# Patient Record
Sex: Male | Born: 1955
Health system: Southern US, Community
[De-identification: ages and names within clinical notes are randomized; demographics above are authoritative.]

## PROBLEM LIST (undated history)

## (undated) DIAGNOSIS — I10 Essential (primary) hypertension: Secondary | ICD-10-CM

## (undated) HISTORY — DX: Essential (primary) hypertension: I10

---
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
## 1991-05-24 HISTORY — PX: OTHER SURGICAL HISTORY: SHX169

## 1996-05-23 HISTORY — PX: OTHER SURGICAL HISTORY: SHX169

## 2006-12-14 ENCOUNTER — Encounter: Admission: RE | Admit: 2006-12-14 | Discharge: 2006-12-14 | Payer: Self-pay | Admitting: Orthopedic Surgery

## 2006-12-22 HISTORY — PX: OTHER SURGICAL HISTORY: SHX169

## 2007-03-13 ENCOUNTER — Ambulatory Visit: Payer: Self-pay | Admitting: Family Medicine

## 2007-03-13 DIAGNOSIS — I10 Essential (primary) hypertension: Secondary | ICD-10-CM | POA: Insufficient documentation

## 2007-03-13 DIAGNOSIS — H409 Unspecified glaucoma: Secondary | ICD-10-CM | POA: Insufficient documentation

## 2007-03-16 LAB — CONVERTED CEMR LAB
AST: 27 units/L (ref 0–37)
Albumin: 4.5 g/dL (ref 3.5–5.2)
Basophils Absolute: 0 10*3/uL (ref 0.0–0.1)
Calcium: 9.9 mg/dL (ref 8.4–10.5)
Chloride: 104 meq/L (ref 96–112)
Cholesterol: 196 mg/dL (ref 0–200)
Eosinophils Absolute: 0.1 10*3/uL (ref 0.0–0.6)
GFR calc non Af Amer: 75 mL/min
MCHC: 34.5 g/dL (ref 30.0–36.0)
MCV: 86.3 fL (ref 78.0–100.0)
PSA: 1.15 ng/mL (ref 0.10–4.00)
Platelets: 214 10*3/uL (ref 150–400)
RBC: 4.93 M/uL (ref 4.22–5.81)
Sodium: 141 meq/L (ref 135–145)
Total CHOL/HDL Ratio: 6.2
Triglycerides: 142 mg/dL (ref 0–149)

## 2007-04-20 ENCOUNTER — Ambulatory Visit: Payer: Self-pay | Admitting: Family Medicine

## 2008-06-16 ENCOUNTER — Telehealth: Payer: Self-pay | Admitting: Family Medicine

## 2008-07-14 ENCOUNTER — Ambulatory Visit: Payer: Self-pay | Admitting: Family Medicine

## 2008-07-14 LAB — CONVERTED CEMR LAB
Bilirubin Urine: NEGATIVE
Glucose, Urine, Semiquant: NEGATIVE
Ketones, urine, test strip: NEGATIVE
Specific Gravity, Urine: 1.015
Urobilinogen, UA: 0.2
pH: 7

## 2008-07-21 LAB — CONVERTED CEMR LAB
Basophils Absolute: 0 10*3/uL (ref 0.0–0.1)
Bilirubin, Direct: 0.1 mg/dL (ref 0.0–0.3)
Calcium: 9.5 mg/dL (ref 8.4–10.5)
Cholesterol: 198 mg/dL (ref 0–200)
Creatinine, Ser: 1.2 mg/dL (ref 0.4–1.5)
Eosinophils Absolute: 0.1 10*3/uL (ref 0.0–0.7)
GFR calc Af Amer: 82 mL/min
HCT: 43 % (ref 39.0–52.0)
Hemoglobin: 14.8 g/dL (ref 13.0–17.0)
LDL Cholesterol: 127 mg/dL — ABNORMAL HIGH (ref 0–99)
MCHC: 34.4 g/dL (ref 30.0–36.0)
MCV: 86.4 fL (ref 78.0–100.0)
Monocytes Absolute: 0.4 10*3/uL (ref 0.1–1.0)
Neutro Abs: 4.5 10*3/uL (ref 1.4–7.7)
PSA: 1.25 ng/mL (ref 0.10–4.00)
RDW: 12.4 % (ref 11.5–14.6)
Sodium: 139 meq/L (ref 135–145)
TSH: 1.16 microintl units/mL (ref 0.35–5.50)
Total Bilirubin: 1 mg/dL (ref 0.3–1.2)
Triglycerides: 171 mg/dL — ABNORMAL HIGH (ref 0–149)

## 2008-07-22 ENCOUNTER — Ambulatory Visit: Payer: Self-pay | Admitting: Family Medicine

## 2010-10-11 ENCOUNTER — Encounter: Payer: Self-pay | Admitting: Family Medicine

## 2010-10-11 ENCOUNTER — Ambulatory Visit (INDEPENDENT_AMBULATORY_CARE_PROVIDER_SITE_OTHER): Payer: 59 | Admitting: Family Medicine

## 2010-10-11 VITALS — BP 122/78 | HR 80 | Temp 98.6°F | Wt 244.0 lb

## 2010-10-11 DIAGNOSIS — I10 Essential (primary) hypertension: Secondary | ICD-10-CM

## 2010-10-11 MED ORDER — LOSARTAN POTASSIUM-HCTZ 50-12.5 MG PO TABS: 1.0000 | ORAL_TABLET | Freq: Every day | ORAL | Status: DC

## 2010-10-11 NOTE — Progress Notes (Signed)
  Subjective:    Patient ID: Andrew Vaughn, male    DOB: 04-08-1956, 55 y.o.   MRN: [id number]  HPI Here to follow up on HTN after an absence of 2 years. He feels fine and say his BP has been stable. He is trying to watch his weight with mixed results.he has set up a cpx with labs for July.   Review of Systems  Constitutional: Negative.   Respiratory: Negative.   Cardiovascular: Negative.        Objective:   Physical Exam  Constitutional: He appears well-developed and well-nourished.  Neck: No thyromegaly present.  Cardiovascular: Normal rate, normal heart sounds and intact distal pulses.   Pulmonary/Chest: Effort normal and breath sounds normal.  Lymphadenopathy:    He has no cervical adenopathy.          Assessment & Plan:  Switch to Losartan HCT to save money. Follow up at the cpx

## 2010-12-01 ENCOUNTER — Other Ambulatory Visit (INDEPENDENT_AMBULATORY_CARE_PROVIDER_SITE_OTHER): Payer: 59

## 2010-12-01 DIAGNOSIS — Z0000 Encounter for general adult medical examination without abnormal findings: Secondary | ICD-10-CM

## 2010-12-01 LAB — BASIC METABOLIC PANEL
CO2: 29 mEq/L (ref 19–32)
Calcium: 9.4 mg/dL (ref 8.4–10.5)
Chloride: 105 mEq/L (ref 96–112)
Sodium: 140 mEq/L (ref 135–145)

## 2010-12-01 LAB — CBC WITH DIFFERENTIAL/PLATELET
Basophils Relative: 0.4 % (ref 0.0–3.0)
Eosinophils Absolute: 0.2 10*3/uL (ref 0.0–0.7)
Eosinophils Relative: 2.1 % (ref 0.0–5.0)
Hemoglobin: 14.7 g/dL (ref 13.0–17.0)
Lymphocytes Relative: 23.8 % (ref 12.0–46.0)
MCHC: 34.2 g/dL (ref 30.0–36.0)
Neutro Abs: 4.9 10*3/uL (ref 1.4–7.7)
Neutrophils Relative %: 67.5 % (ref 43.0–77.0)
RBC: 4.93 Mil/uL (ref 4.22–5.81)
WBC: 7.2 10*3/uL (ref 4.5–10.5)

## 2010-12-01 LAB — LIPID PANEL
HDL: 36.9 mg/dL — ABNORMAL LOW (ref >39.00)
Total CHOL/HDL Ratio: 5

## 2010-12-01 LAB — POCT URINALYSIS DIPSTICK
Bilirubin, UA: NEGATIVE
Glucose, UA: NEGATIVE
Ketones, UA: NEGATIVE
Leukocytes, UA: NEGATIVE
Nitrite, UA: NEGATIVE
pH, UA: 5.5

## 2010-12-01 LAB — HEPATIC FUNCTION PANEL
ALT: 25 U/L (ref 0–53)
AST: 30 U/L (ref 0–37)
Albumin: 4.8 g/dL (ref 3.5–5.2)
Alkaline Phosphatase: 85 U/L (ref 39–117)
Bilirubin, Direct: 0.3 mg/dL (ref 0.0–0.3)
Total Protein: 7.2 g/dL (ref 6.0–8.3)

## 2010-12-01 LAB — PSA: PSA: 1.31 ng/mL (ref 0.10–4.00)

## 2010-12-01 LAB — TSH: TSH: 1.16 u[IU]/mL (ref 0.35–5.50)

## 2010-12-08 ENCOUNTER — Encounter: Payer: Self-pay | Admitting: Family Medicine

## 2010-12-08 ENCOUNTER — Ambulatory Visit (INDEPENDENT_AMBULATORY_CARE_PROVIDER_SITE_OTHER): Payer: 59 | Admitting: Family Medicine

## 2010-12-08 VITALS — BP 120/80 | HR 70 | Temp 98.2°F | Ht 70.0 in | Wt 242.0 lb

## 2010-12-08 DIAGNOSIS — Z0000 Encounter for general adult medical examination without abnormal findings: Secondary | ICD-10-CM

## 2010-12-08 NOTE — Progress Notes (Signed)
  Subjective:    Patient ID: Andrew Vaughn, male    DOB: [birth year], 55 y.o.   MRN: [id number]  HPI 55 yr old male for a cpx. He feels fine and has no concerns.    Review of Systems  Constitutional: Negative.   HENT: Negative.   Eyes: Negative.   Respiratory: Negative.   Cardiovascular: Negative.   Gastrointestinal: Negative.   Genitourinary: Negative.   Musculoskeletal: Negative.   Skin: Negative.   Neurological: Negative.   Hematological: Negative.   Psychiatric/Behavioral: Negative.        Objective:   Physical Exam  Constitutional: He is oriented to person, place, and time. He appears well-developed and well-nourished. No distress.  HENT:  Head: Normocephalic and atraumatic.  Right Ear: External ear normal.  Left Ear: External ear normal.  Nose: Nose normal.  Mouth/Throat: Oropharynx is clear and moist. No oropharyngeal exudate.  Eyes: Conjunctivae and EOM are normal. Pupils are equal, round, and reactive to light. Right eye exhibits no discharge. Left eye exhibits no discharge. No scleral icterus.  Neck: Neck supple. No JVD present. No tracheal deviation present. No thyromegaly present.  Cardiovascular: Normal rate, regular rhythm, normal heart sounds and intact distal pulses.  Exam reveals no gallop and no friction rub.   No murmur heard.      EKG normal  Pulmonary/Chest: Effort normal and breath sounds normal. No respiratory distress. He has no wheezes. He has no rales. He exhibits no tenderness.  Abdominal: Soft. Bowel sounds are normal. He exhibits no distension and no mass. There is no tenderness. There is no rebound and no guarding.  Genitourinary: Rectum normal, prostate normal and penis normal. Guaiac negative stool. No penile tenderness.  Musculoskeletal: Normal range of motion. He exhibits no edema and no tenderness.  Lymphadenopathy:    He has no cervical adenopathy.  Neurological: He is alert and oriented to person, place, and time. He has normal  reflexes. No cranial nerve deficit. He exhibits normal muscle tone. Coordination normal.  Skin: Skin is warm and dry. No rash noted. He is not diaphoretic. No erythema. No pallor.  Psychiatric: He has a normal mood and affect. His behavior is normal. Judgment and thought content normal.          Assessment & Plan:  He already watches his diet to keep the cholesterol down. I suggested he add fish oil supplements daily. Set up a colonoscopy.

## 2011-01-04 ENCOUNTER — Encounter: Payer: Self-pay | Admitting: Gastroenterology

## 2011-02-21 ENCOUNTER — Ambulatory Visit (AMBULATORY_SURGERY_CENTER): Payer: 59 | Admitting: *Deleted

## 2011-02-21 ENCOUNTER — Encounter: Payer: Self-pay | Admitting: Gastroenterology

## 2011-02-21 VITALS — Ht 71.0 in | Wt 242.0 lb

## 2011-02-21 DIAGNOSIS — Z1211 Encounter for screening for malignant neoplasm of colon: Secondary | ICD-10-CM

## 2011-02-21 MED ORDER — PEG-KCL-NACL-NASULF-NA ASC-C 100 G PO SOLR: ORAL | Status: DC

## 2011-03-07 ENCOUNTER — Other Ambulatory Visit: Payer: 59 | Admitting: Gastroenterology

## 2011-03-30 NOTE — Progress Notes (Signed)
Addended by: Maple Hudson on: 03/30/2011 12:39 PM   Modules accepted: Level of Service

## 2011-11-18 ENCOUNTER — Other Ambulatory Visit: Payer: Self-pay | Admitting: Family Medicine

## 2011-11-18 NOTE — Telephone Encounter (Signed)
Call in #30 with 2 rf. He needs and OV soon

## 2011-11-18 NOTE — Telephone Encounter (Signed)
Can we refill this? 

## 2011-11-30 ENCOUNTER — Telehealth: Payer: Self-pay | Admitting: Family Medicine

## 2011-11-30 NOTE — Telephone Encounter (Signed)
I phoned in again today and left a voice message for pt.

## 2011-11-30 NOTE — Telephone Encounter (Signed)
Patient called stating that as of 11/24/11 his rx for losartan was still not at his pharmacy. [facility] [address] Please assist.

## 2013-01-19 ENCOUNTER — Other Ambulatory Visit: Payer: Self-pay | Admitting: Family Medicine

## 2013-01-23 ENCOUNTER — Other Ambulatory Visit: Payer: Self-pay | Admitting: Family Medicine

## 2013-01-24 NOTE — Telephone Encounter (Signed)
Looks like we have not seen this person in a long time?

## 2013-02-11 NOTE — Telephone Encounter (Signed)
Pt scheduled med check, would like to receive refill to last until then. Please assist.

## 2013-02-22 ENCOUNTER — Encounter: Payer: Self-pay | Admitting: Family Medicine

## 2013-02-22 ENCOUNTER — Ambulatory Visit (INDEPENDENT_AMBULATORY_CARE_PROVIDER_SITE_OTHER): Payer: BC Managed Care – PPO | Admitting: Family Medicine

## 2013-02-22 VITALS — BP 152/90 | HR 75 | Temp 98.4°F | Wt 245.0 lb

## 2013-02-22 DIAGNOSIS — I10 Essential (primary) hypertension: Secondary | ICD-10-CM

## 2013-02-22 MED ORDER — LOSARTAN POTASSIUM-HCTZ 50-12.5 MG PO TABS: ORAL_TABLET | ORAL | Status: DC

## 2013-02-22 NOTE — Progress Notes (Signed)
  Subjective:    Patient ID: Andrew Vaughn, male    DOB: 1955-06-22, 57 y.o.   MRN: [id number]  HPI Here to follow up. He feels fine but his meds ran out several months ago. He has not checked his BP.    Review of Systems  Constitutional: Negative.   Respiratory: Negative.   Cardiovascular: Negative.        Objective:   Physical Exam  Constitutional: He appears well-developed and well-nourished.  Cardiovascular: Normal rate, regular rhythm, normal heart sounds and intact distal pulses.   Pulmonary/Chest: Effort normal and breath sounds normal.          Assessment & Plan:  Get back on previous meds. He will set up a cpx soon.

## 2014-07-12 ENCOUNTER — Other Ambulatory Visit: Payer: Self-pay | Admitting: Family Medicine

## 2014-07-16 ENCOUNTER — Ambulatory Visit (INDEPENDENT_AMBULATORY_CARE_PROVIDER_SITE_OTHER): Payer: BLUE CROSS/BLUE SHIELD | Admitting: Family Medicine

## 2014-07-16 ENCOUNTER — Encounter: Payer: Self-pay | Admitting: Family Medicine

## 2014-07-16 VITALS — BP 152/77 | HR 63 | Temp 98.3°F | Ht 71.0 in | Wt 240.0 lb

## 2014-07-16 DIAGNOSIS — N529 Male erectile dysfunction, unspecified: Secondary | ICD-10-CM | POA: Insufficient documentation

## 2014-07-16 DIAGNOSIS — I10 Essential (primary) hypertension: Secondary | ICD-10-CM

## 2014-07-16 DIAGNOSIS — N528 Other male erectile dysfunction: Secondary | ICD-10-CM

## 2014-07-16 MED ORDER — LOSARTAN POTASSIUM-HCTZ 50-12.5 MG PO TABS: 1.0000 | ORAL_TABLET | Freq: Every day | ORAL | Status: DC

## 2014-07-16 MED ORDER — SILDENAFIL CITRATE 100 MG PO TABS: 100.0000 mg | ORAL_TABLET | ORAL | Status: DC | PRN

## 2014-07-16 NOTE — Progress Notes (Signed)
   Subjective:    Patient ID: Andrew Vaughn, male    DOB: 01/04/1956, 59 y.o.   MRN: [id number]  HPI Here to follow up on HTN. He has been rationing his BP meds for the past month so his pressure is up higher than usual. He has felt fine but he asks about trouble with erections. He has been spending a lot of time out of town the past few years but now plans to be here on a regular basis.    Review of Systems  Constitutional: Negative.   Respiratory: Negative.   Cardiovascular: Negative.   Neurological: Negative.        Objective:   Physical Exam  Constitutional: He appears well-developed and well-nourished.  Cardiovascular: Normal rate, regular rhythm, normal heart sounds and intact distal pulses.   Pulmonary/Chest: Effort normal and breath sounds normal.          Assessment & Plan:  Refilled his Hyzaar. He will schedule a cpx with labs sometime soon. Try Viagra 100 mg.

## 2014-07-16 NOTE — Progress Notes (Signed)
Pre visit review using our clinic review tool, if applicable. No additional management support is needed unless otherwise documented below in the visit note. 

## 2014-07-17 ENCOUNTER — Telehealth: Payer: Self-pay | Admitting: Family Medicine

## 2014-07-17 NOTE — Telephone Encounter (Signed)
emmi emailed °

## 2014-09-09 ENCOUNTER — Other Ambulatory Visit: Payer: BLUE CROSS/BLUE SHIELD

## 2014-09-16 ENCOUNTER — Encounter: Payer: BLUE CROSS/BLUE SHIELD | Admitting: Family Medicine

## 2014-12-02 ENCOUNTER — Other Ambulatory Visit (INDEPENDENT_AMBULATORY_CARE_PROVIDER_SITE_OTHER): Payer: BLUE CROSS/BLUE SHIELD

## 2014-12-02 DIAGNOSIS — Z0000 Encounter for general adult medical examination without abnormal findings: Secondary | ICD-10-CM | POA: Diagnosis not present

## 2014-12-02 LAB — CBC WITH DIFFERENTIAL/PLATELET
BASOS ABS: 0 10*3/uL (ref 0.0–0.1)
BASOS PCT: 0.4 % (ref 0.0–3.0)
EOS ABS: 0.1 10*3/uL (ref 0.0–0.7)
EOS PCT: 1.7 % (ref 0.0–5.0)
HEMATOCRIT: 42 % (ref 39.0–52.0)
HEMOGLOBIN: 13.9 g/dL (ref 13.0–17.0)
Lymphocytes Relative: 19.1 % (ref 12.0–46.0)
Lymphs Abs: 1.4 10*3/uL (ref 0.7–4.0)
MCHC: 33.2 g/dL (ref 30.0–36.0)
MCV: 86.7 fl (ref 78.0–100.0)
MONOS PCT: 5.8 % (ref 3.0–12.0)
Monocytes Absolute: 0.4 10*3/uL (ref 0.1–1.0)
NEUTROS ABS: 5.2 10*3/uL (ref 1.4–7.7)
Neutrophils Relative %: 73 % (ref 43.0–77.0)
Platelets: 190 10*3/uL (ref 150.0–400.0)
RBC: 4.85 Mil/uL (ref 4.22–5.81)
RDW: 13.3 % (ref 11.5–15.5)
WBC: 7.1 10*3/uL (ref 4.0–10.5)

## 2014-12-02 LAB — POCT URINALYSIS DIPSTICK
Blood, UA: NEGATIVE
Glucose, UA: NEGATIVE
Ketones, UA: NEGATIVE
Leukocytes, UA: NEGATIVE
Nitrite, UA: NEGATIVE
PH UA: 6
SPEC GRAV UA: 1.025
Urobilinogen, UA: 1

## 2014-12-02 LAB — PSA: PSA: 3.4 ng/mL (ref 0.10–4.00)

## 2014-12-02 LAB — HEPATIC FUNCTION PANEL
ALT: 20 U/L (ref 0–53)
AST: 24 U/L (ref 0–37)
Albumin: 4.4 g/dL (ref 3.5–5.2)
Alkaline Phosphatase: 86 U/L (ref 39–117)
BILIRUBIN TOTAL: 0.9 mg/dL (ref 0.2–1.2)
Bilirubin, Direct: 0.2 mg/dL (ref 0.0–0.3)
TOTAL PROTEIN: 7 g/dL (ref 6.0–8.3)

## 2014-12-02 LAB — BASIC METABOLIC PANEL
BUN: 19 mg/dL (ref 6–23)
CHLORIDE: 103 meq/L (ref 96–112)
CO2: 31 meq/L (ref 19–32)
CREATININE: 1.06 mg/dL (ref 0.40–1.50)
Calcium: 9.6 mg/dL (ref 8.4–10.5)
GFR: 76.33 mL/min (ref >60.00)
Glucose, Bld: 94 mg/dL (ref 70–99)
POTASSIUM: 4.6 meq/L (ref 3.5–5.1)
SODIUM: 141 meq/L (ref 135–145)

## 2014-12-02 LAB — LIPID PANEL
CHOL/HDL RATIO: 5
CHOLESTEROL: 158 mg/dL (ref 0–200)
HDL: 30.5 mg/dL — ABNORMAL LOW (ref >39.00)
LDL Cholesterol: 109 mg/dL — ABNORMAL HIGH (ref 0–99)
NONHDL: 127.5
Triglycerides: 91 mg/dL (ref 0.0–149.0)
VLDL: 18.2 mg/dL (ref 0.0–40.0)

## 2014-12-02 LAB — TSH: TSH: 1.48 u[IU]/mL (ref 0.35–4.50)

## 2014-12-09 ENCOUNTER — Encounter: Payer: Self-pay | Admitting: Family Medicine

## 2014-12-09 ENCOUNTER — Ambulatory Visit (INDEPENDENT_AMBULATORY_CARE_PROVIDER_SITE_OTHER): Payer: BLUE CROSS/BLUE SHIELD | Admitting: Family Medicine

## 2014-12-09 VITALS — BP 127/76 | HR 66 | Temp 98.7°F | Ht 71.0 in | Wt 238.0 lb

## 2014-12-09 DIAGNOSIS — N138 Other obstructive and reflux uropathy: Secondary | ICD-10-CM

## 2014-12-09 DIAGNOSIS — N401 Enlarged prostate with lower urinary tract symptoms: Secondary | ICD-10-CM

## 2014-12-09 DIAGNOSIS — Z0000 Encounter for general adult medical examination without abnormal findings: Secondary | ICD-10-CM

## 2014-12-09 NOTE — Progress Notes (Signed)
   Subjective:    Patient ID: Andrew Vaughn, male    DOB: 1956/03/31, 59 y.o.   MRN: [id number]  HPI 59 yr old male for a cpx. He feels well. He sees his ophthalmologist every 6 months.    Review of Systems  Constitutional: Negative.   HENT: Negative.   Eyes: Negative.   Respiratory: Negative.   Cardiovascular: Negative.   Gastrointestinal: Negative.   Genitourinary: Negative.   Musculoskeletal: Negative.   Skin: Negative.   Neurological: Negative.   Psychiatric/Behavioral: Negative.        Objective:   Physical Exam  Constitutional: He is oriented to person, place, and time. He appears well-developed and well-nourished. No distress.  HENT:  Head: Normocephalic and atraumatic.  Right Ear: External ear normal.  Left Ear: External ear normal.  Nose: Nose normal.  Mouth/Throat: Oropharynx is clear and moist. No oropharyngeal exudate.  Eyes: Conjunctivae and EOM are normal. Pupils are equal, round, and reactive to light. Right eye exhibits no discharge. Left eye exhibits no discharge. No scleral icterus.  Neck: Neck supple. No JVD present. No tracheal deviation present. No thyromegaly present.  Cardiovascular: Normal rate, regular rhythm, normal heart sounds and intact distal pulses.  Exam reveals no gallop and no friction rub.   No murmur heard. EKG normal   Pulmonary/Chest: Effort normal and breath sounds normal. No respiratory distress. He has no wheezes. He has no rales. He exhibits no tenderness.  Abdominal: Soft. Bowel sounds are normal. He exhibits no distension and no mass. There is no tenderness. There is no rebound and no guarding.  Genitourinary: Rectum normal, prostate normal and penis normal. Guaiac negative stool. No penile tenderness.  Musculoskeletal: Normal range of motion. He exhibits no edema or tenderness.  Lymphadenopathy:    He has no cervical adenopathy.  Neurological: He is alert and oriented to person, place, and time. He has normal reflexes. No  cranial nerve deficit. He exhibits normal muscle tone. Coordination normal.  Skin: Skin is warm and dry. No rash noted. He is not diaphoretic. No erythema. No pallor.  Psychiatric: He has a normal mood and affect. His behavior is normal. Judgment and thought content normal.          Assessment & Plan:  Well exam. We discussed diet and exercise advice. He needs to lose some more weight. Set up a colonoscopy soon. His PSA, although in the normal range, has jumped up by over 2 full points from last time, so we will follow this closely. He will come in for another PSA check in 6 months .

## 2014-12-09 NOTE — Addendum Note (Signed)
Addended by: Alysia Penna A on: 12/09/2014 09:55 AM   Modules accepted: Orders

## 2014-12-09 NOTE — Progress Notes (Signed)
Pre visit review using our clinic review tool, if applicable. No additional management support is needed unless otherwise documented below in the visit note. 

## 2015-07-30 ENCOUNTER — Other Ambulatory Visit: Payer: Self-pay | Admitting: Family Medicine

## 2015-11-11 DIAGNOSIS — H401111 Primary open-angle glaucoma, right eye, mild stage: Secondary | ICD-10-CM | POA: Diagnosis not present

## 2015-12-23 ENCOUNTER — Other Ambulatory Visit: Payer: Self-pay | Admitting: Family Medicine

## 2016-01-08 DIAGNOSIS — M1812 Unilateral primary osteoarthritis of first carpometacarpal joint, left hand: Secondary | ICD-10-CM | POA: Diagnosis not present

## 2016-01-08 DIAGNOSIS — M65321 Trigger finger, right index finger: Secondary | ICD-10-CM | POA: Diagnosis not present

## 2016-03-08 ENCOUNTER — Other Ambulatory Visit: Payer: Self-pay | Admitting: Family Medicine

## 2016-03-08 NOTE — Telephone Encounter (Signed)
Left message to return call, pt hasn't been seen in over a year, needs appt for further refills.

## 2016-03-17 ENCOUNTER — Other Ambulatory Visit (INDEPENDENT_AMBULATORY_CARE_PROVIDER_SITE_OTHER): Payer: BLUE CROSS/BLUE SHIELD

## 2016-03-17 DIAGNOSIS — Z0000 Encounter for general adult medical examination without abnormal findings: Secondary | ICD-10-CM

## 2016-03-17 LAB — BASIC METABOLIC PANEL
BUN: 17 mg/dL (ref 6–23)
CO2: 30 mEq/L (ref 19–32)
CREATININE: 1.12 mg/dL (ref 0.40–1.50)
Calcium: 9.6 mg/dL (ref 8.4–10.5)
Chloride: 102 mEq/L (ref 96–112)
GFR: 71.31 mL/min (ref >60.00)
GLUCOSE: 92 mg/dL (ref 70–99)
POTASSIUM: 4.5 meq/L (ref 3.5–5.1)
Sodium: 140 mEq/L (ref 135–145)

## 2016-03-17 LAB — CBC WITH DIFFERENTIAL/PLATELET
BASOS PCT: 0.3 % (ref 0.0–3.0)
Basophils Absolute: 0 10*3/uL (ref 0.0–0.1)
EOS ABS: 0.3 10*3/uL (ref 0.0–0.7)
EOS PCT: 2.8 % (ref 0.0–5.0)
HCT: 43.8 % (ref 39.0–52.0)
Hemoglobin: 14.8 g/dL (ref 13.0–17.0)
LYMPHS ABS: 1.1 10*3/uL (ref 0.7–4.0)
Lymphocytes Relative: 11.5 % — ABNORMAL LOW (ref 12.0–46.0)
MCHC: 33.8 g/dL (ref 30.0–36.0)
MCV: 85.4 fl (ref 78.0–100.0)
MONO ABS: 0.6 10*3/uL (ref 0.1–1.0)
Monocytes Relative: 6.1 % (ref 3.0–12.0)
NEUTROS PCT: 79.3 % — AB (ref 43.0–77.0)
Neutro Abs: 7.9 10*3/uL — ABNORMAL HIGH (ref 1.4–7.7)
Platelets: 193 10*3/uL (ref 150.0–400.0)
RBC: 5.14 Mil/uL (ref 4.22–5.81)
RDW: 12.9 % (ref 11.5–15.5)
WBC: 9.9 10*3/uL (ref 4.0–10.5)

## 2016-03-17 LAB — POC URINALSYSI DIPSTICK (AUTOMATED)
BILIRUBIN UA: NEGATIVE
Blood, UA: NEGATIVE
Glucose, UA: NEGATIVE
KETONES UA: NEGATIVE
LEUKOCYTES UA: NEGATIVE
NITRITE UA: NEGATIVE
Spec Grav, UA: 1.025
Urobilinogen, UA: 0.2
pH, UA: 5.5

## 2016-03-17 LAB — HEPATIC FUNCTION PANEL
ALT: 19 U/L (ref 0–53)
AST: 23 U/L (ref 0–37)
Albumin: 4.6 g/dL (ref 3.5–5.2)
Alkaline Phosphatase: 94 U/L (ref 39–117)
BILIRUBIN DIRECT: 0.2 mg/dL (ref 0.0–0.3)
BILIRUBIN TOTAL: 0.9 mg/dL (ref 0.2–1.2)
Total Protein: 7.3 g/dL (ref 6.0–8.3)

## 2016-03-17 LAB — LIPID PANEL
CHOL/HDL RATIO: 5
CHOLESTEROL: 173 mg/dL (ref 0–200)
HDL: 37.7 mg/dL — AB (ref >39.00)
LDL CALC: 117 mg/dL — AB (ref 0–99)
NonHDL: 135.56
TRIGLYCERIDES: 91 mg/dL (ref 0.0–149.0)
VLDL: 18.2 mg/dL (ref 0.0–40.0)

## 2016-03-17 LAB — TSH: TSH: 0.82 u[IU]/mL (ref 0.35–4.50)

## 2016-03-17 LAB — PSA: PSA: 4.15 ng/mL — ABNORMAL HIGH (ref 0.10–4.00)

## 2016-03-22 ENCOUNTER — Ambulatory Visit (INDEPENDENT_AMBULATORY_CARE_PROVIDER_SITE_OTHER): Payer: BLUE CROSS/BLUE SHIELD | Admitting: Family Medicine

## 2016-03-22 ENCOUNTER — Encounter: Payer: Self-pay | Admitting: Family Medicine

## 2016-03-22 VITALS — BP 149/85 | HR 64 | Temp 98.5°F | Ht 71.0 in | Wt 240.0 lb

## 2016-03-22 DIAGNOSIS — Z0000 Encounter for general adult medical examination without abnormal findings: Secondary | ICD-10-CM

## 2016-03-22 DIAGNOSIS — R972 Elevated prostate specific antigen [PSA]: Secondary | ICD-10-CM | POA: Diagnosis not present

## 2016-03-22 NOTE — Progress Notes (Signed)
Pre visit review using our clinic review tool, if applicable. No additional management support is needed unless otherwise documented below in the visit note. 

## 2016-03-23 DIAGNOSIS — R972 Elevated prostate specific antigen [PSA]: Secondary | ICD-10-CM | POA: Insufficient documentation

## 2016-03-23 NOTE — Progress Notes (Addendum)
   Subjective:    Patient ID: Andrew Vaughn, male    DOB: May 25, 1955, 60 y.o.   MRN: [id number]  HPI 60 yr old male for a well exam. He feels well. He sees Dermatology once a year for skin checks and he will ask them about a lump on his back that has been present several years.    Review of Systems  Constitutional: Negative.   HENT: Negative.   Eyes: Negative.   Respiratory: Negative.   Cardiovascular: Negative.   Gastrointestinal: Negative.   Genitourinary: Negative.   Musculoskeletal: Negative.   Skin: Negative.   Neurological: Negative.   Psychiatric/Behavioral: Negative.        Objective:   Physical Exam  Constitutional: He is oriented to person, place, and time. He appears well-developed and well-nourished. No distress.  HENT:  Head: Normocephalic and atraumatic.  Right Ear: External ear normal.  Left Ear: External ear normal.  Nose: Nose normal.  Mouth/Throat: Oropharynx is clear and moist. No oropharyngeal exudate.  Eyes: Conjunctivae and EOM are normal. Pupils are equal, round, and reactive to light. Right eye exhibits no discharge. Left eye exhibits no discharge. No scleral icterus.  Neck: Neck supple. No JVD present. No tracheal deviation present. No thyromegaly present.  Cardiovascular: Normal rate, regular rhythm, normal heart sounds and intact distal pulses.  Exam reveals no gallop and no friction rub.   No murmur heard. EKG normal   Pulmonary/Chest: Effort normal and breath sounds normal. No respiratory distress. He has no wheezes. He has no rales. He exhibits no tenderness.  Abdominal: Soft. Bowel sounds are normal. He exhibits no distension and no mass. There is no tenderness. There is no rebound and no guarding.  Genitourinary: Rectum normal, prostate normal and penis normal. Rectal exam shows guaiac negative stool. No penile tenderness.  Musculoskeletal: Normal range of motion. He exhibits no edema or tenderness.  Lymphadenopathy:    He has no cervical  adenopathy.  Neurological: He is alert and oriented to person, place, and time. He has normal reflexes. No cranial nerve deficit. He exhibits normal muscle tone. Coordination normal.  Skin: Skin is warm and dry. No rash noted. He is not diaphoretic. No erythema. No pallor.  Large non-tender sebaceous cyst on the left upper back   Psychiatric: He has a normal mood and affect. His behavior is normal. Judgment and thought content normal.  The computer read the EKG as "old lateral infarct" but I disagree with this error. The EKG is actually within normal limits and has not changes changed since his last EKG was obtained. This was obtained to aid in our management of his HTN, and with this result we do not need to change our management.         Assessment & Plan:  Well exam. We discussed diet and exercise. His PSA is mildly elevated so we will refer him to Urology to evaluate. Set up his first colonoscopy.  [name], MD

## 2016-04-25 ENCOUNTER — Encounter: Payer: Self-pay | Admitting: Family Medicine

## 2016-04-27 DIAGNOSIS — H5213 Myopia, bilateral: Secondary | ICD-10-CM | POA: Diagnosis not present

## 2016-04-27 DIAGNOSIS — H2512 Age-related nuclear cataract, left eye: Secondary | ICD-10-CM | POA: Diagnosis not present

## 2016-04-27 DIAGNOSIS — H401122 Primary open-angle glaucoma, left eye, moderate stage: Secondary | ICD-10-CM | POA: Diagnosis not present

## 2016-04-27 DIAGNOSIS — H401111 Primary open-angle glaucoma, right eye, mild stage: Secondary | ICD-10-CM | POA: Diagnosis not present

## 2016-05-11 DIAGNOSIS — R972 Elevated prostate specific antigen [PSA]: Secondary | ICD-10-CM | POA: Diagnosis not present

## 2016-05-11 DIAGNOSIS — R3912 Poor urinary stream: Secondary | ICD-10-CM | POA: Diagnosis not present

## 2016-05-11 DIAGNOSIS — N401 Enlarged prostate with lower urinary tract symptoms: Secondary | ICD-10-CM | POA: Diagnosis not present

## 2016-05-18 DIAGNOSIS — N401 Enlarged prostate with lower urinary tract symptoms: Secondary | ICD-10-CM | POA: Diagnosis not present

## 2016-06-03 ENCOUNTER — Other Ambulatory Visit: Payer: Self-pay | Admitting: Family Medicine

## 2016-10-24 DIAGNOSIS — N400 Enlarged prostate without lower urinary tract symptoms: Secondary | ICD-10-CM | POA: Diagnosis not present

## 2016-10-24 LAB — PSA: PSA: 2.46

## 2016-10-25 DIAGNOSIS — H401131 Primary open-angle glaucoma, bilateral, mild stage: Secondary | ICD-10-CM | POA: Diagnosis not present

## 2016-10-25 DIAGNOSIS — H2513 Age-related nuclear cataract, bilateral: Secondary | ICD-10-CM | POA: Diagnosis not present

## 2016-10-31 DIAGNOSIS — N400 Enlarged prostate without lower urinary tract symptoms: Secondary | ICD-10-CM | POA: Diagnosis not present

## 2016-10-31 DIAGNOSIS — R972 Elevated prostate specific antigen [PSA]: Secondary | ICD-10-CM | POA: Diagnosis not present

## 2016-11-04 ENCOUNTER — Encounter: Payer: Self-pay | Admitting: Family Medicine

## 2016-12-28 DIAGNOSIS — N5201 Erectile dysfunction due to arterial insufficiency: Secondary | ICD-10-CM | POA: Diagnosis not present

## 2017-01-17 ENCOUNTER — Other Ambulatory Visit: Payer: Self-pay | Admitting: Family Medicine

## 2017-02-02 DIAGNOSIS — L720 Epidermal cyst: Secondary | ICD-10-CM | POA: Diagnosis not present

## 2017-02-02 DIAGNOSIS — L812 Freckles: Secondary | ICD-10-CM | POA: Diagnosis not present

## 2017-02-02 DIAGNOSIS — L821 Other seborrheic keratosis: Secondary | ICD-10-CM | POA: Diagnosis not present

## 2017-02-02 DIAGNOSIS — D1801 Hemangioma of skin and subcutaneous tissue: Secondary | ICD-10-CM | POA: Diagnosis not present

## 2017-04-21 DIAGNOSIS — L720 Epidermal cyst: Secondary | ICD-10-CM | POA: Diagnosis not present

## 2017-05-02 DIAGNOSIS — H2513 Age-related nuclear cataract, bilateral: Secondary | ICD-10-CM | POA: Diagnosis not present

## 2017-05-02 DIAGNOSIS — H401131 Primary open-angle glaucoma, bilateral, mild stage: Secondary | ICD-10-CM | POA: Diagnosis not present

## 2017-06-13 DIAGNOSIS — M1711 Unilateral primary osteoarthritis, right knee: Secondary | ICD-10-CM | POA: Diagnosis not present

## 2017-08-08 ENCOUNTER — Other Ambulatory Visit: Payer: Self-pay | Admitting: Family Medicine

## 2017-08-23 ENCOUNTER — Other Ambulatory Visit: Payer: Self-pay | Admitting: Family Medicine

## 2017-08-23 NOTE — Telephone Encounter (Signed)
Need and OV for more refills

## 2017-08-29 ENCOUNTER — Telehealth: Payer: Self-pay | Admitting: Family Medicine

## 2017-08-29 ENCOUNTER — Other Ambulatory Visit: Payer: Self-pay | Admitting: Family Medicine

## 2017-08-29 MED ORDER — HYDROCHLOROTHIAZIDE 12.5 MG PO CAPS: 12.5000 mg | ORAL_CAPSULE | Freq: Every day | ORAL | 3 refills | Status: DC

## 2017-08-29 MED ORDER — LOSARTAN POTASSIUM 50 MG PO TABS: 50.0000 mg | ORAL_TABLET | Freq: Every day | ORAL | 3 refills | Status: DC

## 2017-08-29 NOTE — Telephone Encounter (Signed)
Per the OK by Dr. Sarajane Jews send in Rx separately.   Rx's have been sent.   Called and spoke with pt. Pt advised and voiced understanding.

## 2017-08-29 NOTE — Telephone Encounter (Signed)
Duplicate encounter

## 2017-08-29 NOTE — Telephone Encounter (Signed)
Copied from [place] [id number]. Topic: Inquiry >> Aug 29, 2017  3:18 PM Pricilla Handler wrote: Reason for CRM: Patient called requesting an URGENT refill of Losartan-hydrochlorothiazide (HYZAAR) 50-12.5 MG tablet. Patient has been out of the medication for almost a week. Patient's preferred pharmacy is  [id number] [address], [place] [phone] (Phone)     [phone] (Fax).  Please call patient once refill has been sent to the pharmacy.       Thank You!!!

## 2017-08-29 NOTE — Telephone Encounter (Signed)
I spoke with pharmacy, medication is on backorder, not sure when it will be available, requesting to either split both medications or recommend alternative?

## 2017-08-29 NOTE — Telephone Encounter (Signed)
Copied from [place] [id number]. Topic: Inquiry >> Aug 29, 2017  3:18 PM Pricilla Handler wrote: Reason for CRM: Patient called requesting an URGENT refill of Losartan-hydrochlorothiazide (HYZAAR) 50-12.5 MG tablet. Patient has been out of the medication for almost a week. Patient's preferred pharmacy is  [id number] [address], [place] [phone] (Phone)     [phone] (Fax).  Please call patient once refill has been sent to the pharmacy.       Thank You!!!

## 2017-09-04 ENCOUNTER — Telehealth: Payer: Self-pay

## 2017-09-04 NOTE — Telephone Encounter (Signed)
[facility] [address] [place]   Medication Losartan-HCTZ   Medication is on backorder/unavailable: medication split up are available but combination is on back order  Rx sent separately

## 2017-09-05 ENCOUNTER — Ambulatory Visit: Payer: BLUE CROSS/BLUE SHIELD | Admitting: Family Medicine

## 2017-09-19 ENCOUNTER — Ambulatory Visit: Payer: BLUE CROSS/BLUE SHIELD | Admitting: Family Medicine

## 2017-09-19 ENCOUNTER — Encounter: Payer: Self-pay | Admitting: Family Medicine

## 2017-09-19 VITALS — BP 118/80 | HR 69 | Temp 98.4°F | Ht 71.0 in | Wt 249.6 lb

## 2017-09-19 DIAGNOSIS — I10 Essential (primary) hypertension: Secondary | ICD-10-CM

## 2017-09-19 NOTE — Progress Notes (Signed)
   Subjective:    Patient ID: Andrew Vaughn, male    DOB: Mar 19, 1956, 62 y.o.   MRN: [id number]  HPI Here to follow up on HTN. He feels well. He has less stress in his life now that tax season is over.    Review of Systems  Constitutional: Negative.   Respiratory: Negative.   Cardiovascular: Negative.   Neurological: Negative.        Objective:   Physical Exam  Constitutional: He is oriented to person, place, and time. He appears well-developed and well-nourished.  Cardiovascular: Normal rate, regular rhythm, normal heart sounds and intact distal pulses.  Pulmonary/Chest: Effort normal and breath sounds normal.  Musculoskeletal: He exhibits no edema.  Neurological: He is alert and oriented to person, place, and time.          Assessment & Plan:  HTN, well controlled. He will return soon for a well exam.  Alysia Penna, MD

## 2017-09-25 DIAGNOSIS — H5213 Myopia, bilateral: Secondary | ICD-10-CM | POA: Diagnosis not present

## 2017-09-25 DIAGNOSIS — H2512 Age-related nuclear cataract, left eye: Secondary | ICD-10-CM | POA: Diagnosis not present

## 2017-09-25 DIAGNOSIS — H401131 Primary open-angle glaucoma, bilateral, mild stage: Secondary | ICD-10-CM | POA: Diagnosis not present

## 2017-10-19 DIAGNOSIS — H2512 Age-related nuclear cataract, left eye: Secondary | ICD-10-CM | POA: Diagnosis not present

## 2017-10-19 DIAGNOSIS — H25812 Combined forms of age-related cataract, left eye: Secondary | ICD-10-CM | POA: Diagnosis not present

## 2018-01-11 ENCOUNTER — Other Ambulatory Visit: Payer: Self-pay | Admitting: Family Medicine

## 2018-01-18 ENCOUNTER — Other Ambulatory Visit: Payer: Self-pay | Admitting: Family Medicine

## 2018-01-18 NOTE — Telephone Encounter (Signed)
We had sent the prescription in separately due to the combination prescription being on back order.    Called the pharmacy and they stated that they do have the combination tablet Hyzaar on hand.   I did refill this prescription called pt and left a VM to advise that this has been refilled since the combination pill is now in stock. Advised pt to call if they had any questions or concerns.

## 2018-02-06 DIAGNOSIS — D1801 Hemangioma of skin and subcutaneous tissue: Secondary | ICD-10-CM | POA: Diagnosis not present

## 2018-02-06 DIAGNOSIS — L812 Freckles: Secondary | ICD-10-CM | POA: Diagnosis not present

## 2018-02-06 DIAGNOSIS — L570 Actinic keratosis: Secondary | ICD-10-CM | POA: Diagnosis not present

## 2018-02-06 DIAGNOSIS — L821 Other seborrheic keratosis: Secondary | ICD-10-CM | POA: Diagnosis not present

## 2018-02-06 DIAGNOSIS — C44319 Basal cell carcinoma of skin of other parts of face: Secondary | ICD-10-CM | POA: Diagnosis not present

## 2018-03-13 DIAGNOSIS — C44311 Basal cell carcinoma of skin of nose: Secondary | ICD-10-CM | POA: Diagnosis not present

## 2018-03-27 DIAGNOSIS — M1711 Unilateral primary osteoarthritis, right knee: Secondary | ICD-10-CM | POA: Diagnosis not present

## 2018-05-21 DIAGNOSIS — H401131 Primary open-angle glaucoma, bilateral, mild stage: Secondary | ICD-10-CM | POA: Diagnosis not present

## 2018-08-13 ENCOUNTER — Telehealth: Payer: Self-pay | Admitting: Family Medicine

## 2018-08-13 MED ORDER — HYDROCHLOROTHIAZIDE 12.5 MG PO CAPS: 12.5000 mg | ORAL_CAPSULE | Freq: Every day | ORAL | 1 refills | Status: DC

## 2018-08-13 MED ORDER — LOSARTAN POTASSIUM 50 MG PO TABS: 50.0000 mg | ORAL_TABLET | Freq: Every day | ORAL | 1 refills | Status: DC

## 2018-08-13 NOTE — Telephone Encounter (Signed)
rx has been sent to the pharmacy for the losartan and hctz.

## 2018-08-13 NOTE — Telephone Encounter (Signed)
Copied from [place] [id number]. Topic: General - Other >> Aug 13, 2018  3:40 PM Lennox Solders wrote: Reason for CRM:Pt left message on refill line and needs new rx losartan #90 w/refills sent to new pharm [facility]

## 2018-08-13 NOTE — Addendum Note (Signed)
Addended by: Elie Confer on: 08/13/2018 04:02 PM   Modules accepted: Orders

## 2018-11-21 DIAGNOSIS — H5213 Myopia, bilateral: Secondary | ICD-10-CM | POA: Diagnosis not present

## 2018-11-21 DIAGNOSIS — H401111 Primary open-angle glaucoma, right eye, mild stage: Secondary | ICD-10-CM | POA: Diagnosis not present

## 2018-11-21 DIAGNOSIS — H401122 Primary open-angle glaucoma, left eye, moderate stage: Secondary | ICD-10-CM | POA: Diagnosis not present

## 2018-11-21 DIAGNOSIS — H2511 Age-related nuclear cataract, right eye: Secondary | ICD-10-CM | POA: Diagnosis not present

## 2018-12-13 ENCOUNTER — Other Ambulatory Visit: Payer: Self-pay

## 2018-12-13 ENCOUNTER — Encounter: Payer: Self-pay | Admitting: Family Medicine

## 2018-12-13 ENCOUNTER — Ambulatory Visit (INDEPENDENT_AMBULATORY_CARE_PROVIDER_SITE_OTHER): Payer: BC Managed Care – PPO | Admitting: Family Medicine

## 2018-12-13 DIAGNOSIS — R6889 Other general symptoms and signs: Secondary | ICD-10-CM

## 2018-12-13 NOTE — Progress Notes (Signed)
   Subjective:    Patient ID: Andrew Vaughn, male    DOB: Sep 21, 1955, 63 y.o.   MRN: [id number]  HPI Virtual Visit via Telephone Note  I connected with the patient on 12/13/18 at 11:30 AM EDT by telephone and verified that I am speaking with the correct person using two identifiers. We attempted to connect virtually but we had technical difficulties with the audio and video.     I discussed the limitations, risks, security and privacy concerns of performing an evaluation and management service by telephone and the availability of in person appointments. I also discussed with the patient that there may be a patient responsible charge related to this service. The patient expressed understanding and agreed to proceed.  Location patient: home Location provider: work or home office Participants present for the call: patient, provider Patient did not have a visit in the prior 7 days to address this/these issue(s).   History of Present Illness: Here to ask about symptoms that began 10 days ago. These include fever, PND, chest congestion, a dry cough, and mild SOB. He had fevers in the range of 99-100 degrees for 6 days and then these went away. The other symptoms have persisted. He denies any headache, body aches, chest pains, rashes, or NVD. He is drinking fluids. He is taking Mucinex and Nyquil to sleep at night. No one else in his family is sick. He has not been exposed to anyone sick that he knows of and he has not travelled recently.    Observations/Objective: Patient sounds cheerful and well on the phone. I do not appreciate any SOB. Speech and thought processing are grossly intact. Patient reported vitals:  Assessment and Plan: He has symptoms of a viral illness and he will continue with OTC measures for comfort. We will arrange for him to be tested for the Covid-19 virus. I encouraged him to stay away from other people while we wait for the test results to come back.  Alysia Penna, MD    Follow Up Instructions:     [phone] 5-10 [phone] 11-20 [phone] 21-30 I did not refer this patient for an OV in the next 24 hours for this/these issue(s).  I discussed the assessment and treatment plan with the patient. The patient was provided an opportunity to ask questions and all were answered. The patient agreed with the plan and demonstrated an understanding of the instructions.   The patient was advised to call back or seek an in-person evaluation if the symptoms worsen or if the condition fails to improve as anticipated.  I provided  31minutes of non-face-to-face time during this encounter.   Alysia Penna, MD    Review of Systems     Objective:   Physical Exam        Assessment & Plan:

## 2019-01-07 ENCOUNTER — Other Ambulatory Visit: Payer: Self-pay

## 2019-01-07 DIAGNOSIS — Z20822 Contact with and (suspected) exposure to covid-19: Secondary | ICD-10-CM

## 2019-01-08 LAB — SPECIMEN STATUS REPORT

## 2019-01-08 LAB — NOVEL CORONAVIRUS, NAA: SARS-CoV-2, NAA: NOT DETECTED

## 2019-01-11 ENCOUNTER — Encounter: Payer: Self-pay | Admitting: Family Medicine

## 2019-01-11 ENCOUNTER — Other Ambulatory Visit: Payer: Self-pay

## 2019-01-11 ENCOUNTER — Telehealth (INDEPENDENT_AMBULATORY_CARE_PROVIDER_SITE_OTHER): Payer: BC Managed Care – PPO | Admitting: Family Medicine

## 2019-01-11 DIAGNOSIS — J209 Acute bronchitis, unspecified: Secondary | ICD-10-CM

## 2019-01-11 MED ORDER — DOXYCYCLINE HYCLATE 100 MG PO CAPS: 100.0000 mg | ORAL_CAPSULE | Freq: Two times a day (BID) | ORAL | 0 refills | Status: DC

## 2019-01-11 NOTE — Progress Notes (Signed)
Virtual Visit via Telephone Note  I connected with the patient on 01/11/19 at  8:15 AM EDT by telephone and verified that I am speaking with the correct person using two identifiers. We attempted to connect virtually but we had technical difficulties with the audio and video.     I discussed the limitations, risks, security and privacy concerns of performing an evaluation and management service by telephone and the availability of in person appointments. I also discussed with the patient that there may be a patient responsible charge related to this service. The patient expressed understanding and agreed to proceed.  Location patient: home Location provider: work or home office Participants present for the call: patient, provider Patient did not have a visit in the prior 7 days to address this/these issue(s).   History of Present Illness: Here for 3 weeks of intermittent fevers to 99.5 degrees, chest congestion, a non-productive cough, and fatigue. No chest pain or ST. No NVD or body aches. He had a Covid-19 test earlier this week which was negative. He is drinking fluids and taking Mucinex.    Observations/Objective: Patient sounds cheerful and well on the phone. I do not appreciate any SOB. Speech and thought processing are grossly intact. Patient reported vitals:  Assessment and Plan: Bronchitis, treat with Doxycycline. Recheck prn.  Alysia Penna, MD   Follow Up Instructions:     [phone] 5-10 [phone] 11-20 [phone] 21-30 I did not refer this patient for an OV in the next 24 hours for this/these issue(s).  I discussed the assessment and treatment plan with the patient. The patient was provided an opportunity to ask questions and all were answered. The patient agreed with the plan and demonstrated an understanding of the instructions.   The patient was advised to call back or seek an in-person evaluation if the symptoms worsen or if the condition fails to improve as anticipated.  I  provided 14 minutes of non-face-to-face time during this encounter.   Alysia Penna, MD

## 2019-01-21 ENCOUNTER — Telehealth: Payer: Self-pay

## 2019-01-21 ENCOUNTER — Inpatient Hospital Stay (HOSPITAL_BASED_OUTPATIENT_CLINIC_OR_DEPARTMENT_OTHER)
Admission: EM | Admit: 2019-01-21 | Discharge: 2019-01-24 | DRG: 175 | Disposition: A | Discharge status: Home / Self Care | Payer: BC Managed Care – PPO | Attending: Internal Medicine | Admitting: Internal Medicine

## 2019-01-21 ENCOUNTER — Emergency Department (HOSPITAL_BASED_OUTPATIENT_CLINIC_OR_DEPARTMENT_OTHER): Payer: BC Managed Care – PPO

## 2019-01-21 ENCOUNTER — Inpatient Hospital Stay (HOSPITAL_BASED_OUTPATIENT_CLINIC_OR_DEPARTMENT_OTHER): Payer: BC Managed Care – PPO

## 2019-01-21 ENCOUNTER — Other Ambulatory Visit: Payer: Self-pay

## 2019-01-21 ENCOUNTER — Encounter (HOSPITAL_BASED_OUTPATIENT_CLINIC_OR_DEPARTMENT_OTHER): Payer: Self-pay | Admitting: *Deleted

## 2019-01-21 DIAGNOSIS — I160 Hypertensive urgency: Secondary | ICD-10-CM | POA: Diagnosis not present

## 2019-01-21 DIAGNOSIS — Z20828 Contact with and (suspected) exposure to other viral communicable diseases: Secondary | ICD-10-CM | POA: Diagnosis present

## 2019-01-21 DIAGNOSIS — I2510 Atherosclerotic heart disease of native coronary artery without angina pectoris: Secondary | ICD-10-CM | POA: Diagnosis not present

## 2019-01-21 DIAGNOSIS — I2699 Other pulmonary embolism without acute cor pulmonale: Secondary | ICD-10-CM | POA: Diagnosis not present

## 2019-01-21 DIAGNOSIS — I82402 Acute embolism and thrombosis of unspecified deep veins of left lower extremity: Secondary | ICD-10-CM | POA: Diagnosis not present

## 2019-01-21 DIAGNOSIS — Z79899 Other long term (current) drug therapy: Secondary | ICD-10-CM

## 2019-01-21 DIAGNOSIS — I361 Nonrheumatic tricuspid (valve) insufficiency: Secondary | ICD-10-CM | POA: Diagnosis not present

## 2019-01-21 DIAGNOSIS — R0602 Shortness of breath: Secondary | ICD-10-CM | POA: Diagnosis not present

## 2019-01-21 DIAGNOSIS — Z91013 Allergy to seafood: Secondary | ICD-10-CM | POA: Diagnosis not present

## 2019-01-21 DIAGNOSIS — Z8249 Family history of ischemic heart disease and other diseases of the circulatory system: Secondary | ICD-10-CM | POA: Diagnosis not present

## 2019-01-21 DIAGNOSIS — I428 Other cardiomyopathies: Secondary | ICD-10-CM | POA: Diagnosis present

## 2019-01-21 DIAGNOSIS — Z881 Allergy status to other antibiotic agents status: Secondary | ICD-10-CM | POA: Diagnosis not present

## 2019-01-21 DIAGNOSIS — N529 Male erectile dysfunction, unspecified: Secondary | ICD-10-CM | POA: Diagnosis not present

## 2019-01-21 DIAGNOSIS — E785 Hyperlipidemia, unspecified: Secondary | ICD-10-CM | POA: Diagnosis present

## 2019-01-21 DIAGNOSIS — I340 Nonrheumatic mitral (valve) insufficiency: Secondary | ICD-10-CM | POA: Diagnosis not present

## 2019-01-21 DIAGNOSIS — R0600 Dyspnea, unspecified: Secondary | ICD-10-CM

## 2019-01-21 DIAGNOSIS — R778 Other specified abnormalities of plasma proteins: Secondary | ICD-10-CM | POA: Diagnosis present

## 2019-01-21 DIAGNOSIS — I5041 Acute combined systolic (congestive) and diastolic (congestive) heart failure: Secondary | ICD-10-CM | POA: Diagnosis not present

## 2019-01-21 DIAGNOSIS — I712 Thoracic aortic aneurysm, without rupture: Secondary | ICD-10-CM | POA: Diagnosis present

## 2019-01-21 DIAGNOSIS — R7989 Other specified abnormal findings of blood chemistry: Secondary | ICD-10-CM | POA: Diagnosis present

## 2019-01-21 DIAGNOSIS — I509 Heart failure, unspecified: Secondary | ICD-10-CM | POA: Diagnosis not present

## 2019-01-21 DIAGNOSIS — H409 Unspecified glaucoma: Secondary | ICD-10-CM | POA: Diagnosis not present

## 2019-01-21 DIAGNOSIS — F40240 Claustrophobia: Secondary | ICD-10-CM | POA: Diagnosis not present

## 2019-01-21 DIAGNOSIS — I10 Essential (primary) hypertension: Secondary | ICD-10-CM | POA: Diagnosis present

## 2019-01-21 DIAGNOSIS — I110 Hypertensive heart disease with heart failure: Secondary | ICD-10-CM | POA: Diagnosis present

## 2019-01-21 DIAGNOSIS — R0609 Other forms of dyspnea: Secondary | ICD-10-CM

## 2019-01-21 DIAGNOSIS — I82409 Acute embolism and thrombosis of unspecified deep veins of unspecified lower extremity: Secondary | ICD-10-CM | POA: Diagnosis present

## 2019-01-21 DIAGNOSIS — I82442 Acute embolism and thrombosis of left tibial vein: Secondary | ICD-10-CM | POA: Diagnosis not present

## 2019-01-21 DIAGNOSIS — R05 Cough: Secondary | ICD-10-CM | POA: Diagnosis not present

## 2019-01-21 LAB — CBC WITH DIFFERENTIAL/PLATELET
Abs Immature Granulocytes: 0.03 10*3/uL (ref 0.00–0.07)
Basophils Absolute: 0 10*3/uL (ref 0.0–0.1)
Basophils Relative: 0 %
Eosinophils Absolute: 0.1 10*3/uL (ref 0.0–0.5)
Eosinophils Relative: 1 %
HCT: 40.8 % (ref 39.0–52.0)
Hemoglobin: 12.8 g/dL — ABNORMAL LOW (ref 13.0–17.0)
Immature Granulocytes: 0 %
Lymphocytes Relative: 10 %
Lymphs Abs: 0.9 10*3/uL (ref 0.7–4.0)
MCH: 27.8 pg (ref 26.0–34.0)
MCHC: 31.4 g/dL (ref 30.0–36.0)
MCV: 88.7 fL (ref 80.0–100.0)
Monocytes Absolute: 0.5 10*3/uL (ref 0.1–1.0)
Monocytes Relative: 6 %
Neutro Abs: 7.4 10*3/uL (ref 1.7–7.7)
Neutrophils Relative %: 83 %
Platelets: 216 10*3/uL (ref 150–400)
RBC: 4.6 MIL/uL (ref 4.22–5.81)
RDW: 14.1 % (ref 11.5–15.5)
WBC: 9 10*3/uL (ref 4.0–10.5)
nRBC: 0 % (ref 0.0–0.2)

## 2019-01-21 LAB — COMPREHENSIVE METABOLIC PANEL
ALT: 25 U/L (ref 0–44)
AST: 30 U/L (ref 15–41)
Albumin: 4.1 g/dL (ref 3.5–5.0)
Alkaline Phosphatase: 102 U/L (ref 38–126)
Anion gap: 11 (ref 5–15)
BUN: 20 mg/dL (ref 8–23)
CO2: 25 mmol/L (ref 22–32)
Calcium: 9.1 mg/dL (ref 8.9–10.3)
Chloride: 103 mmol/L (ref 98–111)
Creatinine, Ser: 1.1 mg/dL (ref 0.61–1.24)
GFR calc Af Amer: >60 mL/min (ref >60)
GFR calc non Af Amer: >60 mL/min (ref >60)
Glucose, Bld: 126 mg/dL — ABNORMAL HIGH (ref 70–99)
Potassium: 4 mmol/L (ref 3.5–5.1)
Sodium: 139 mmol/L (ref 135–145)
Total Bilirubin: 1.7 mg/dL — ABNORMAL HIGH (ref 0.3–1.2)
Total Protein: 7 g/dL (ref 6.5–8.1)

## 2019-01-21 LAB — TROPONIN I (HIGH SENSITIVITY)
Troponin I (High Sensitivity): 36 ng/L — ABNORMAL HIGH (ref <18)
Troponin I (High Sensitivity): 39 ng/L — ABNORMAL HIGH (ref <18)

## 2019-01-21 LAB — BRAIN NATRIURETIC PEPTIDE: B Natriuretic Peptide: 341.4 pg/mL — ABNORMAL HIGH (ref 0.0–100.0)

## 2019-01-21 LAB — SARS CORONAVIRUS 2 BY RT PCR (HOSPITAL ORDER, PERFORMED IN ~~LOC~~ HOSPITAL LAB): SARS Coronavirus 2: NEGATIVE

## 2019-01-21 MED ORDER — ASPIRIN EC 81 MG PO TBEC
81.0000 mg | DELAYED_RELEASE_TABLET | Freq: Every day | ORAL | Status: DC
  Administered 2019-01-22 – 2019-01-23 (×2): 81 mg via ORAL
  Filled 2019-01-21 (×2): qty 1

## 2019-01-21 MED ORDER — ACETAMINOPHEN 325 MG PO TABS: 650.0000 mg | ORAL_TABLET | ORAL | Status: DC | PRN

## 2019-01-21 MED ORDER — LATANOPROST 0.005 % OP SOLN
1.0000 [drp] | Freq: Every day | OPHTHALMIC | Status: DC
  Filled 2019-01-21: qty 2.5

## 2019-01-21 MED ORDER — NITROGLYCERIN 0.4 MG SL SUBL: 0.4000 mg | SUBLINGUAL_TABLET | SUBLINGUAL | Status: DC | PRN

## 2019-01-21 MED ORDER — TRAVOPROST (BAK FREE) 0.004 % OP SOLN
1.0000 [drp] | Freq: Every day | OPHTHALMIC | Status: DC
  Administered 2019-01-21 – 2019-01-23 (×3): 1 [drp] via OPHTHALMIC

## 2019-01-21 MED ORDER — LATANOPROST 0.005 % OP SOLN: 1.0000 [drp] | Freq: Every day | OPHTHALMIC | Status: DC

## 2019-01-21 MED ORDER — HYDROCHLOROTHIAZIDE 12.5 MG PO CAPS
12.5000 mg | ORAL_CAPSULE | Freq: Every day | ORAL | Status: DC
  Administered 2019-01-22: 12.5 mg via ORAL
  Filled 2019-01-21: qty 1

## 2019-01-21 MED ORDER — ALBUTEROL SULFATE (2.5 MG/3ML) 0.083% IN NEBU: 2.5000 mg | INHALATION_SOLUTION | RESPIRATORY_TRACT | Status: DC | PRN

## 2019-01-21 MED ORDER — DM-GUAIFENESIN ER 30-600 MG PO TB12
1.0000 | ORAL_TABLET | Freq: Two times a day (BID) | ORAL | Status: DC | PRN
  Administered 2019-01-21 – 2019-01-22 (×2): 1 via ORAL
  Filled 2019-01-21 (×3): qty 1

## 2019-01-21 MED ORDER — FUROSEMIDE 10 MG/ML IJ SOLN
20.0000 mg | Freq: Every day | INTRAMUSCULAR | Status: DC
  Administered 2019-01-21 – 2019-01-23 (×3): 20 mg via INTRAVENOUS
  Filled 2019-01-21 (×3): qty 2

## 2019-01-21 MED ORDER — LOSARTAN POTASSIUM 50 MG PO TABS
50.0000 mg | ORAL_TABLET | Freq: Every day | ORAL | Status: DC
  Administered 2019-01-22 – 2019-01-23 (×2): 50 mg via ORAL
  Filled 2019-01-21 (×2): qty 1

## 2019-01-21 MED ORDER — MORPHINE SULFATE (PF) 2 MG/ML IV SOLN: 2.0000 mg | INTRAVENOUS | Status: DC | PRN

## 2019-01-21 MED ORDER — SODIUM CHLORIDE 0.9% FLUSH
3.0000 mL | Freq: Two times a day (BID) | INTRAVENOUS | Status: DC
  Administered 2019-01-21 – 2019-01-22 (×2): 3 mL via INTRAVENOUS

## 2019-01-21 MED ORDER — SODIUM CHLORIDE 0.9% FLUSH: 3.0000 mL | INTRAVENOUS | Status: DC | PRN

## 2019-01-21 MED ORDER — HEPARIN (PORCINE) 25000 UT/250ML-% IV SOLN
1750.0000 [IU]/h | INTRAVENOUS | Status: DC
  Administered 2019-01-21: 1650 [IU]/h via INTRAVENOUS
  Administered 2019-01-22 (×2): 1850 [IU]/h via INTRAVENOUS
  Administered 2019-01-23 – 2019-01-24 (×2): 1750 [IU]/h via INTRAVENOUS
  Filled 2019-01-21 (×5): qty 250

## 2019-01-21 MED ORDER — HEPARIN BOLUS VIA INFUSION
5500.0000 [IU] | Freq: Once | INTRAVENOUS | Status: AC
  Administered 2019-01-21: 5500 [IU] via INTRAVENOUS

## 2019-01-21 MED ORDER — TRAVOPROST (BAK FREE) 0.004 % OP SOLN
1.0000 [drp] | Freq: Every day | OPHTHALMIC | Status: DC
  Filled 2019-01-21: qty 2.5

## 2019-01-21 MED ORDER — HYDRALAZINE HCL 20 MG/ML IJ SOLN
5.0000 mg | INTRAMUSCULAR | Status: DC | PRN
  Administered 2019-01-21: 5 mg via INTRAVENOUS
  Filled 2019-01-21: qty 1

## 2019-01-21 MED ORDER — SODIUM CHLORIDE 0.9 % IV SOLN: 250.0000 mL | INTRAVENOUS | Status: DC | PRN

## 2019-01-21 MED ORDER — IOHEXOL 350 MG/ML SOLN
100.0000 mL | Freq: Once | INTRAVENOUS | Status: AC | PRN
  Administered 2019-01-21: 100 mL via INTRAVENOUS

## 2019-01-21 MED ORDER — ONDANSETRON HCL 4 MG/2ML IJ SOLN: 4.0000 mg | Freq: Four times a day (QID) | INTRAMUSCULAR | Status: DC | PRN

## 2019-01-21 NOTE — ED Notes (Signed)
Called report to [facility]. RN Mable Fill states pt going to a different floor.

## 2019-01-21 NOTE — ED Notes (Signed)
Report to [facility].  

## 2019-01-21 NOTE — ED Notes (Signed)
ED Provider at bedside. 

## 2019-01-21 NOTE — Progress Notes (Signed)
ANTICOAGULATION CONSULT NOTE - Initial Consult  Pharmacy Consult for heparin Indication: pulmonary embolus and DVT  Allergies  Allergen Reactions  . Erythromycin Rash    Patient Measurements: Height: 6' (182.9 cm) Weight: 250 lb (113.4 kg) IBW/kg (Calculated) : 77.6 Heparin Dosing Weight: 102kg  Vital Signs: Temp: 98.7 F (37.1 C) (08/31 1525) Temp Source: Oral (08/31 1525) BP: 183/114 (08/31 2041) Pulse Rate: 83 (08/31 1850)  Labs: Recent Labs    01/21/19 1620 01/21/19 1815  HGB 12.8*  --   HCT 40.8  --   PLT 216  --   CREATININE 1.10  --   TROPONINIHS 36* 39*    Estimated Creatinine Clearance: 91.7 mL/min (by C-G formula based on SCr of 1.1 mg/dL).   Medical History: Past Medical History:  Diagnosis Date  . Glaucoma    per Dr. Ellie Lunch  . Hypertension    Assessment: 89 YOM presenting with cough and leg swelling, on CT small nonocclusive PE RLL, doppler with nonocclusive DVT in tibial vein.  CBC wnl, no anticoagulation PTA.    Goal of Therapy:  Heparin level 0.3-0.7 units/ml Monitor platelets by anticoagulation protocol: Yes   Plan:  Heparin 5500 units IV x1, and gtt at 1650 units/hr F/u heparin level in 6 hours  Bertis Ruddy, PharmD Clinical Pharmacist Please check AMION for all [place] numbers 01/21/2019 8:52 PM

## 2019-01-21 NOTE — ED Provider Notes (Signed)
[facility] EMERGENCY DEPARTMENT Provider Note   CSN: [id number] Arrival date & time: 01/21/19  1518     History   Chief Complaint Chief Complaint  Patient presents with   Shortness of Breath    HPI Andrew Vaughn is a 63 y.o. male with past medical history significant for hypertension who presents to the ER with a 37-month complaint of increasing dyspnea on exertion, cough, and leg swelling.  He was evaluated virtually approximately 10 days ago by his PCP who prescribed doxycycline for his fever and cough symptoms.  His fever has since subsided, but his cough and dyspnea on exertion continued.  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 He has had some bloating which she attributes to the doxycycline that he received 10 days ago.  He notes that last week and he went to [place], [place] to go hiking with his wife, but he was limited due to his dyspnea which is unusual.  Patient's father had congestive heart failure.  Patient denies any current fever, chills, runny nose, sore throat, sneezing, chest pain, back pain, dizziness, blurred vision, abdominal pain, nausea, vomiting, change in bowel habits, or urinary symptoms.  Patient is taking his Hyzaar as prescribed.  Patient denies any smoking, drinking, or illicit drug use.     HPI  Past Medical History:  Diagnosis Date   Glaucoma    per Dr. Ellie Lunch   Hypertension     Patient Active Problem List   Diagnosis Date Noted   Elevated troponin 01/21/2019   Hypertensive urgency 01/21/2019   PE (pulmonary thromboembolism) ([place]) 01/21/2019   Acute CHF (congestive heart failure) ([place]) 01/21/2019   Elevated PSA 03/23/2016   Erectile dysfunction 07/16/2014   GLAUCOMA NOS 03/13/2007   Essential hypertension 03/13/2007    Past Surgical History:  Procedure Laterality Date   dislocated left shoulder  1993   right knee giant cell bone tumor  1998   per Dr. Leonides Schanz at [facility]    ruptured lumbar disc  1976   torn meniscus left knee  8/08   Dr. French Ana        Home Medications    Prior to Admission medications   Medication Sig Start Date End Date Taking? Authorizing Provider  doxycycline (VIBRAMYCIN) 100 MG capsule Take 1 capsule (100 mg total) by mouth 2 (two) times daily for 10 days. 01/11/19 01/21/19  [name], MD  hydrochlorothiazide (MICROZIDE) 12.5 MG capsule Take 1 capsule (12.5 mg total) by mouth daily. 08/13/18   [name], MD  losartan (COZAAR) 50 MG tablet Take 1 tablet (50 mg total) by mouth daily. 08/13/18   [name], MD  losartan-hydrochlorothiazide [facility]) 50-12.5 MG tablet TAKE 1 TABLET BY MOUTH EVERY DAY 01/18/18   [name], MD  travoprost, benzalkonium, (TRAVATAN) 0.004 % ophthalmic solution Place 1 drop into both eyes daily.      [provider]  VIAGRA 100 MG tablet TAKE 1 TABLET (100 MG TOTAL) BY MOUTH AS NEEDED FOR ERECTILE DYSFUNCTION. 12/23/15   [name], MD    Family History Family History  Problem Relation Age of Onset   Hypertension Other    Stroke Other        first degree male relative    Heart disease Other    Glaucoma Other     Social History Social History   Tobacco Use   Smoking status: Never Smoker   Smokeless tobacco: Never Used  Substance Use Topics   Alcohol use: Yes    Alcohol/week: 0.0 standard drinks    Comment: occ   Drug use: No     Allergies   Erythromycin   Review of Systems Review of Systems Ten systems are reviewed and are negative for acute change except as noted in the HPI   Physical Exam Updated Vital Signs BP (!) 170/91    Pulse 92    Temp 98.7 F (37.1 C) (Oral)    Resp 19    Ht 6' (1.829 m)    Wt 113.4 kg    SpO2 96%    BMI 33.91 kg/m   Physical Exam Constitutional:      Appearance: Normal appearance.  HENT:     Head: Normocephalic and atraumatic.  Eyes:     Pupils: Pupils are equal, round, and reactive to light.  Neck:     Vascular:  No JVD.  Cardiovascular:     Rate and Rhythm: Normal rate and regular rhythm.  Pulmonary:     Effort: Pulmonary effort is normal.     Breath sounds: Normal breath sounds.  Chest:     Chest wall: No tenderness.  Abdominal:     General: Bowel sounds are normal.     Palpations: Abdomen is soft.  Musculoskeletal:     Right lower leg: Edema present.     Left lower leg: Edema present.     Comments: Patient has pitting edema bilaterally, greater swelling and edema on left side.  Skin:    General: Skin is warm and dry.  Neurological:     General: No focal deficit present.     Mental Status: He is alert and oriented to person, place, and time.  Psychiatric:        Mood and Affect: Mood normal.        Behavior: Behavior normal.        Thought Content: Thought content normal.      ED Treatments / Results  Labs (all labs ordered are listed, but only abnormal results are displayed) Labs Reviewed  BRAIN NATRIURETIC PEPTIDE - Abnormal; Notable for the following components:      Result Value   B Natriuretic Peptide 341.4 (*)    All other components within normal limits  CBC WITH DIFFERENTIAL/PLATELET - Abnormal; Notable for the following components:   Hemoglobin 12.8 (*)    All other components within normal limits  COMPREHENSIVE METABOLIC PANEL - Abnormal; Notable for the following components:   Glucose, Bld 126 (*)    Total Bilirubin 1.7 (*)    All other components within normal limits  TROPONIN I (HIGH SENSITIVITY) - Abnormal; Notable for the following components:   Troponin I (High Sensitivity) 36 (*)    All other components within normal limits  TROPONIN I (HIGH SENSITIVITY) - Abnormal; Notable for the following components:   Troponin I (High Sensitivity) 39 (*)    All other components within normal limits  SARS CORONAVIRUS 2 (HOSPITAL ORDER, [facility])    EKG EKG Interpretation  Date/Time:  Monday January 21 2019 16:27:03 EDT Ventricular Rate:    78 PR Interval:    QRS Duration: 94 QT Interval:  404 QTC Calculation: 461 R Axis:   100 Text Interpretation:  Sinus rhythm Ventricular premature complex Anterior infarct, old Borderline T abnormalities, inferior leads changed from prior EKG Confirmed by Malvin Johns [phone]) on 01/21/2019 4:48:51 PM   Radiology Dg Chest 2 View  Result Date: 01/21/2019 CLINICAL  DATA:  Cough, shortness of breath EXAM: CHEST - 2 VIEW COMPARISON:  None. FINDINGS: Cardiomegaly. Mild diffuse interstitial pulmonary opacity and small bilateral pleural effusions. The visualized skeletal structures are unremarkable. IMPRESSION: Cardiomegaly. Mild diffuse interstitial pulmonary opacity and small bilateral pleural effusions, most consistent with edema. No focal airspace opacity. Electronically Signed   By: Eddie Candle M.D.   On: 01/21/2019 15:47   Ct Angio Chest Pe W And/or Wo Contrast  Result Date: 01/21/2019 CLINICAL DATA:  Concern for PE.  Positive DVT study. EXAM: CT ANGIOGRAPHY CHEST WITH CONTRAST TECHNIQUE: Multidetector CT imaging of the chest was performed using the standard protocol during bolus administration of intravenous contrast. Multiplanar CT image reconstructions and MIPs were obtained to evaluate the vascular anatomy. CONTRAST:  135mL OMNIPAQUE IOHEXOL 350 MG/ML SOLN COMPARISON:  None. FINDINGS: Cardiovascular: Contrast injection is sufficient to demonstrate satisfactory opacification of the pulmonary arteries to the segmental level.Findings are concerning for a small pulmonary embolus involving a segmental branch of the right lower lobe (axial series 6, image 183). No additional pulmonary emboli identified. The main pulmonary artery is within normal limits for size. There is no CT evidence of acute right heart strain. The ascending aorta is dilated measuring approximately 4.2 cm in diameter. The heart size is enlarged. There is a trace pericardial effusion. There is reflux of contrast into the IVC.  Mediastinum/Nodes: --there are enlarged mediastinal and hilar lymph nodes. For example there is a right paratracheal lymph node measuring approximately 1.3 cm. --No axillary lymphadenopathy. --No supraclavicular lymphadenopathy. --Normal thyroid gland. --The esophagus is unremarkable Lungs/Pleura: There are moderate bilateral pleural effusions. There is diffuse interlobular septal thickening. There are areas of atelectasis at the right lung base. There is a rounded opacity involving the right lower lobe measuring approximately 1 cm (axial series 5, image 74). There is no pneumothorax. Upper Abdomen: No acute abnormality. Musculoskeletal: No chest wall abnormality. No acute or significant osseous findings. Review of the MIP images confirms the above findings. IMPRESSION: 1. Very tiny nonocclusive pulmonary embolus involving the right lower lobe as detailed above. 2. Overall findings most concerning for congestive heart failure including moderate-sized bilateral pleural effusions. 3. Mediastinal adenopathy, presumably reactive. 4. There is a 1 cm rounded nodular opacity involving the right lower lobe as detailed above. This may represent a focus of atelectasis, infiltrate, or pulmonary nodule. A three-month follow-up CT of the chest is recommended to confirm resolution of this finding. 5. Dilated ascending aorta measuring 4.2 cm. Recommend annual imaging followup by CTA or MRA. This recommendation follows 2010 ACCF/AHA/AATS/ACR/ASA/SCA/SCAI/SIR/STS/SVM Guidelines for the Diagnosis and Management of Patients with Thoracic Aortic Disease. Circulation. 2010; [id number]. Aortic aneurysm NOS (ICD10-I71.9) These results were called by telephone at the time of interpretation on 01/21/2019 at 8:32 pm to the ED attending working with Carlisle Cater , who verbally acknowledged these results. Electronically Signed   By: Constance Holster M.D.   On: 01/21/2019 20:35   US Venous Img Lower  Left (dvt Study)  Result Date:  01/21/2019 CLINICAL DATA:  Left lower extremity pain EXAM: LEFT LOWER EXTREMITY VENOUS DOPPLER ULTRASOUND TECHNIQUE: Gray-scale sonography with graded compression, as well as color Doppler and duplex ultrasound were performed to evaluate the lower extremity deep venous systems from the level of the common femoral vein and including the common femoral, femoral, profunda femoral, popliteal and calf veins including the posterior tibial, peroneal and gastrocnemius veins when visible. The superficial great saphenous vein was also interrogated. Spectral Doppler was utilized to evaluate flow at  rest and with distal augmentation maneuvers in the common femoral, femoral and popliteal veins. COMPARISON:  None. FINDINGS: Contralateral Common Femoral Vein: Respiratory phasicity is normal and symmetric with the symptomatic side. No evidence of thrombus. Normal compressibility. Common Femoral Vein: No evidence of thrombus. Normal compressibility, respiratory phasicity and response to augmentation. Saphenofemoral Junction: No evidence of thrombus. Normal compressibility and flow on color Doppler imaging. Profunda Femoral Vein: No evidence of thrombus. Normal compressibility and flow on color Doppler imaging. Femoral Vein: No evidence of thrombus. Normal compressibility, respiratory phasicity and response to augmentation. Popliteal Vein: No evidence of thrombus. Normal compressibility, respiratory phasicity and response to augmentation. Calf Veins: There appears to be nonocclusive thrombus in the posterior tibial vein. The peroneal vein was not visualized. Superficial Great Saphenous Vein: No evidence of thrombus. Normal compressibility. Venous Reflux:  None. Other Findings:  None. IMPRESSION: Study positive for a nonocclusive DVT in the posterior tibial vein. Electronically Signed   By: Constance Holster M.D.   On: 01/21/2019 19:43    Procedures Procedures (including critical care time)  Medications Ordered in  ED Medications  hydrALAZINE (APRESOLINE) injection 5 mg (5 mg Intravenous Given 01/21/19 2041)  heparin ADULT infusion 100 units/mL (25000 units/275mL sodium chloride 0.45%) (1,650 Units/hr Intravenous Transfusing/Transfer 01/21/19 2211)  iohexol (OMNIPAQUE) 350 MG/ML injection 100 mL (100 mLs Intravenous Contrast Given 01/21/19 2009)  heparin bolus via infusion 5,500 Units (5,500 Units Intravenous Bolus from Bag 01/21/19 2059)     Initial Impression / Assessment and Plan / ED Course  I have reviewed the triage vital signs and the nursing notes.  Pertinent labs & imaging results that were available during my care of the patient were reviewed by me and considered in my medical decision making (see chart for details).        Patient's history, physical exam, labs, and imaging is concerning for new onset heart failure.  Interpreted patient's chest x-ray which demonstrates edema.  Dyspnea on exertion was concerning for ACS, but EKG and only mildly elevated troponin were reassuring.  Patient has pitting edema bilaterally.  Patient was recently treated with doxycycline, his WBC is normal, and he has been afebrile for nearly two weeks, so I do not suspect an active infectious process. Left leg is consistently more swollen and edematous than the right leg, but patient states that legs usually are symmetric and he denies any pain, redness, smoking, history of PE, bedrest, or recent surgery. Still moderate risk per Well's criteria. Doppler US lower left leg was interpreted and revealed evidence of a non-occlusive DVT in posterior tibial vein.  CTA revealed small PE, so we will anticoagulate. Determined patient needs to be further evaluated and have potential etiologies elucidated for his suspected new onset heart failure. Consulted [facility] hospitalist group and they have agreed to admit.    Final Clinical Impressions(s) / ED Diagnoses   Final diagnoses:  New onset of congestive heart failure ([place])  Deep vein  thrombosis (DVT) of tibial vein of left lower extremity, unspecified chronicity ([place])  Acute pulmonary embolism, unspecified pulmonary embolism type, unspecified whether acute cor pulmonale present [facility])    ED Discharge Orders    None       Corena Herter, PA-C 01/21/19 2235    Malvin Johns, MD 01/21/19 2237

## 2019-01-21 NOTE — Care Management (Addendum)
This is a no charge note  Pending admission per Dr.   Nicki Guadalajara from [facility] per PA, Andrew Vaughn  63 year old man with past medical history of hypertension, glaucoma, erectile dysfunction, who presents with shortness of breath, bilateral leg edema and cough for more than 1 month.  Patient also has bilateral leg edema (the left leg is worse than the right).  Patient has had intermittent fever recently, but not today. Patient had negative COVID-19 test on 01/07/2019 in PCPs office.  Patient was treated empirically with 1 course of doxycycline by PCP without improvement.  Patient was found to have troponin 36-39, BNP 341.1, pending COVID-19 test, electrolytes renal function okay, temperature normal, blood pressure 194/127, 179/116, heart rate 83, oxygen saturation 97-100% on room air, temperature normal.  Chest x-ray showed cardiomegaly with mild interstitial opacity which is likely due to edema and small bilateral pleural effusion.  Pending lower extremity venous Doppler test for ruling out DVT.  Clinically consistent with possible acute CHF.  Patient is admitted to telemetry bed as inpatient.  Addendum: 1. LE venous doppler of left leg is positive for a nonocclusive DVT in the posterior tibial vein. 2. CTA: showed 1. Very tiny nonocclusive pulmonary embolus involving the right lower lobe as detailed above. 2. Overall findings most concerning for congestive heart failure including moderate-sized bilateral pleural effusions. 3. Mediastinal adenopathy, presumably reactive. 4. There is a 1 cm rounded nodular opacity involving the right lower lobe as detailed above. This may represent a focus of atelectasis, infiltrate, or pulmonary nodule. A three-month follow-up CT of the chest is recommended to confirm resolution of this finding. 5. Dilated ascending aorta measuring 4.2 cm.    Please call manager of [facility] at [phone] when pt arrives to floor   Andrew Costa, MD  [facility]   If  7PM-7AM, please contact night-coverage [email or web address] Password [facility] 01/21/2019, 7:31 PM

## 2019-01-21 NOTE — ED Triage Notes (Addendum)
Pt c/o SOB . Cough x 2 months , and leg swelling

## 2019-01-21 NOTE — ED Notes (Signed)
Patient transported to X-ray 

## 2019-01-21 NOTE — ED Notes (Signed)
[facility] notified (Taryn) - patient ready for transport 

## 2019-01-21 NOTE — ED Notes (Signed)
ED TO INPATIENT HANDOFF REPORT  ED Nurse Name and Phone #: Marisa Hua RN [phone]  S Name/Age/Gender Andrew Vaughn 63 y.o. male Room/Bed: MH09/MH09  Code Status   Code Status: Not on file  Home/SNF/Other Home Patient oriented to: self, place, time and situation Is this baseline? Yes   Triage Complete: Triage complete  Chief Complaint cough and shortness of breath  Triage Note Pt c/o SOB . Cough x 2 months , and leg swelling    Allergies Allergies  Allergen Reactions  . Erythromycin Rash    Level of Care/Admitting Diagnosis ED Disposition    ED Disposition Condition [facility] Area: [place] [[id number]]  Level of Care: Telemetry Cardiac [103]  Covid Evaluation: Confirmed COVID Negative  Diagnosis: [facility]congestive heart failure) [id number]  Admitting Physician: Ivor Costa [[id number]]  Attending Physician: Ivor Costa [phone]  Estimated length of stay: past midnight tomorrow  Certification:: I certify this patient will need inpatient services for at least 2 midnights  PT Class (Do Not Modify): Inpatient [101]  PT Acc Code (Do Not Modify): Private [1]       B Medical/Surgery History Past Medical History:  Diagnosis Date  . Glaucoma    per Dr. Ellie Lunch  . Hypertension    Past Surgical History:  Procedure Laterality Date  . dislocated left shoulder  1993  . right knee giant cell bone tumor  1998   per Dr. Leonides Schanz at [facility]  . ruptured lumbar disc  1976  . torn meniscus left knee  8/08   Dr. Jessee Avers IV Location/Drains/Wounds Patient Lines/Drains/Airways Status   Active Line/Drains/Airways    Name:   Placement date:   Placement time:   Site:   Days:   Peripheral IV 01/21/19 Left Antecubital   01/21/19    1624    Antecubital   less than 1          Intake/Output Last 24 hours No intake or output data in the 24 hours ending 01/21/19 2112  Labs/Imaging Results for orders placed or performed  during the hospital encounter of 01/21/19 (from the past 48 hour(s))  Brain natriuretic peptide     Status: Abnormal   Collection Time: 01/21/19  4:20 PM  Result Value Ref Range   B Natriuretic Peptide 341.4 (H) 0.0 - 100.0 pg/mL    Comment: Performed at [facility], [address] [place], [place] [address]  CBC with Differential     Status: Abnormal   Collection Time: 01/21/19  4:20 PM  Result Value Ref Range   WBC 9.0 4.0 - 10.5 K/uL   RBC 4.60 4.22 - 5.81 MIL/uL   Hemoglobin 12.8 (L) 13.0 - 17.0 g/dL   HCT 40.8 39.0 - 52.0 %   MCV 88.7 80.0 - 100.0 fL   MCH 27.8 26.0 - 34.0 pg   MCHC 31.4 30.0 - 36.0 g/dL   RDW 14.1 11.5 - 15.5 %   Platelets 216 150 - 400 K/uL   nRBC 0.0 0.0 - 0.2 %   Neutrophils Relative % 83 %   Neutro Abs 7.4 1.7 - 7.7 K/uL   Lymphocytes Relative 10 %   Lymphs Abs 0.9 0.7 - 4.0 K/uL   Monocytes Relative 6 %   Monocytes Absolute 0.5 0.1 - 1.0 K/uL   Eosinophils Relative 1 %   Eosinophils Absolute 0.1 0.0 - 0.5 K/uL   Basophils Relative 0 %  Basophils Absolute 0.0 0.0 - 0.1 K/uL   Immature Granulocytes 0 %   Abs Immature Granulocytes 0.03 0.00 - 0.07 K/uL    Comment: Performed at [facility], [address] [place], [place] [address]  Comprehensive metabolic panel     Status: Abnormal   Collection Time: 01/21/19  4:20 PM  Result Value Ref Range   Sodium 139 135 - 145 mmol/L   Potassium 4.0 3.5 - 5.1 mmol/L   Chloride 103 98 - 111 mmol/L   CO2 25 22 - 32 mmol/L   Glucose, Bld 126 (H) 70 - 99 mg/dL   BUN 20 8 - 23 mg/dL   Creatinine, Ser 1.10 0.61 - 1.24 mg/dL   Calcium 9.1 8.9 - 10.3 mg/dL   Total Protein 7.0 6.5 - 8.1 g/dL   Albumin 4.1 3.5 - 5.0 g/dL   AST 30 15 - 41 U/L   ALT 25 0 - 44 U/L   Alkaline Phosphatase 102 38 - 126 U/L   Total Bilirubin 1.7 (H) 0.3 - 1.2 mg/dL   GFR calc non Af Amer >60 >60 mL/min   GFR calc Af Amer >60 >60 mL/min   Anion gap 11 5 - 15    Comment: Performed at [facility], [address] [place], [place] [address]  Troponin I (High Sensitivity)     Status: Abnormal   Collection Time: 01/21/19  4:20 PM  Result Value Ref Range   Troponin I (High Sensitivity) 36 (H) <18 ng/L    Comment: (NOTE) Elevated high sensitivity troponin I (hsTnI) values and significant  changes across serial measurements may suggest ACS but many other  chronic and acute conditions are known to elevate hsTnI results.  Refer to the "Links" section for chest pain algorithms and additional  guidance. Performed at [facility], [address] [place], [place] [address]   Troponin I (High Sensitivity)     Status: Abnormal   Collection Time: 01/21/19  6:15 PM  Result Value Ref Range   Troponin I (High Sensitivity) 39 (H) <18 ng/L    Comment: (NOTE) Elevated high sensitivity troponin I (hsTnI) values and significant  changes across serial measurements may suggest ACS but many other  chronic and acute conditions are known to elevate hsTnI results.  Refer to the "Links" section for chest pain algorithms and additional  guidance. Performed at [facility], [address] [place], [place] [address]   SARS Coronavirus 2 [facility] order, Performed in [facility] hospital lab) Nasopharyngeal Nasopharyngeal Swab     Status: None   Collection Time: 01/21/19  6:50 PM   Specimen: Nasopharyngeal Swab  Result Value Ref Range   SARS Coronavirus 2 NEGATIVE NEGATIVE    Comment: (NOTE) If result is NEGATIVE SARS-CoV-2 target nucleic acids are NOT DETECTED. The SARS-CoV-2 RNA is generally detectable in upper and lower  respiratory specimens during the acute phase of infection. The lowest  concentration of SARS-CoV-2 viral copies this assay can detect is 250  copies / mL. A negative result does not preclude SARS-CoV-2 infection  and should not be used as the sole basis for treatment or other  patient management decisions.  A negative result may occur with  improper specimen  collection / handling, submission of specimen other  than nasopharyngeal swab, presence of viral mutation(s) within the  areas targeted by this assay, and inadequate number of viral copies  (<250 copies / mL). A negative result  must be combined with clinical  observations, patient history, and epidemiological information. If result is POSITIVE SARS-CoV-2 target nucleic acids are DETECTED. The SARS-CoV-2 RNA is generally detectable in upper and lower  respiratory specimens dur ing the acute phase of infection.  Positive  results are indicative of active infection with SARS-CoV-2.  Clinical  correlation with patient history and other diagnostic information is  necessary to determine patient infection status.  Positive results do  not rule out bacterial infection or co-infection with other viruses. If result is PRESUMPTIVE POSTIVE SARS-CoV-2 nucleic acids MAY BE PRESENT.   A presumptive positive result was obtained on the submitted specimen  and confirmed on repeat testing.  While 2019 novel coronavirus  (SARS-CoV-2) nucleic acids may be present in the submitted sample  additional confirmatory testing may be necessary for epidemiological  and / or clinical management purposes  to differentiate between  SARS-CoV-2 and other Sarbecovirus currently known to infect humans.  If clinically indicated additional testing with an alternate test  methodology [phone]) is advised. The SARS-CoV-2 RNA is generally  detectable in upper and lower respiratory sp ecimens during the acute  phase of infection. The expected result is Negative. Fact Sheet for Patients:  StrictlyIdeas.no Fact Sheet for Healthcare Providers: BankingDealers.co.za This test is not yet approved or cleared by the [place] FDA and has been authorized for detection and/or diagnosis of SARS-CoV-2 by FDA under an Emergency Use Authorization (EUA).  This EUA will remain in effect  (meaning this test can be used) for the duration of the COVID-19 declaration under Section 564(b)(1) of the Act, 21 U.S.C. section 360bbb-3(b)(1), unless the authorization is terminated or revoked sooner. Performed at [facility], [address] [place], [place] [address]    Dg Chest 2 View  Result Date: 01/21/2019 CLINICAL DATA:  Cough, shortness of breath EXAM: CHEST - 2 VIEW COMPARISON:  None. FINDINGS: Cardiomegaly. Mild diffuse interstitial pulmonary opacity and small bilateral pleural effusions. The visualized skeletal structures are unremarkable. IMPRESSION: Cardiomegaly. Mild diffuse interstitial pulmonary opacity and small bilateral pleural effusions, most consistent with edema. No focal airspace opacity. Electronically Signed   By: Eddie Candle M.D.   On: 01/21/2019 15:47   Ct Angio Chest Pe W And/or Wo Contrast  Result Date: 01/21/2019 CLINICAL DATA:  Concern for PE.  Positive DVT study. EXAM: CT ANGIOGRAPHY CHEST WITH CONTRAST TECHNIQUE: Multidetector CT imaging of the chest was performed using the standard protocol during bolus administration of intravenous contrast. Multiplanar CT image reconstructions and MIPs were obtained to evaluate the vascular anatomy. CONTRAST:  15mL OMNIPAQUE IOHEXOL 350 MG/ML SOLN COMPARISON:  None. FINDINGS: Cardiovascular: Contrast injection is sufficient to demonstrate satisfactory opacification of the pulmonary arteries to the segmental level.Findings are concerning for a small pulmonary embolus involving a segmental branch of the right lower lobe (axial series 6, image 183). No additional pulmonary emboli identified. The main pulmonary artery is within normal limits for size. There is no CT evidence of acute right heart strain. The ascending aorta is dilated measuring approximately 4.2 cm in diameter. The heart size is enlarged. There is a trace pericardial effusion. There is reflux of contrast into the IVC. Mediastinum/Nodes: --there are  enlarged mediastinal and hilar lymph nodes. For example there is a right paratracheal lymph node measuring approximately 1.3 cm. --No axillary lymphadenopathy. --No supraclavicular lymphadenopathy. --Normal thyroid gland. --The esophagus is unremarkable Lungs/Pleura: There are moderate bilateral pleural effusions. There is diffuse interlobular septal thickening. There are areas of atelectasis at the  right lung base. There is a rounded opacity involving the right lower lobe measuring approximately 1 cm (axial series 5, image 74). There is no pneumothorax. Upper Abdomen: No acute abnormality. Musculoskeletal: No chest wall abnormality. No acute or significant osseous findings. Review of the MIP images confirms the above findings. IMPRESSION: 1. Very tiny nonocclusive pulmonary embolus involving the right lower lobe as detailed above. 2. Overall findings most concerning for congestive heart failure including moderate-sized bilateral pleural effusions. 3. Mediastinal adenopathy, presumably reactive. 4. There is a 1 cm rounded nodular opacity involving the right lower lobe as detailed above. This may represent a focus of atelectasis, infiltrate, or pulmonary nodule. A three-month follow-up CT of the chest is recommended to confirm resolution of this finding. 5. Dilated ascending aorta measuring 4.2 cm. Recommend annual imaging followup by CTA or MRA. This recommendation follows 2010 ACCF/AHA/AATS/ACR/ASA/SCA/SCAI/SIR/STS/SVM Guidelines for the Diagnosis and Management of Patients with Thoracic Aortic Disease. Circulation. 2010; [id number]. Aortic aneurysm NOS (ICD10-I71.9) These results were called by telephone at the time of interpretation on 01/21/2019 at 8:32 pm to the ED attending working with Carlisle Cater , who verbally acknowledged these results. Electronically Signed   By: Constance Holster M.D.   On: 01/21/2019 20:35   US Venous Img Lower  Left (dvt Study)  Result Date: 01/21/2019 CLINICAL DATA:  Left  lower extremity pain EXAM: LEFT LOWER EXTREMITY VENOUS DOPPLER ULTRASOUND TECHNIQUE: Gray-scale sonography with graded compression, as well as color Doppler and duplex ultrasound were performed to evaluate the lower extremity deep venous systems from the level of the common femoral vein and including the common femoral, femoral, profunda femoral, popliteal and calf veins including the posterior tibial, peroneal and gastrocnemius veins when visible. The superficial great saphenous vein was also interrogated. Spectral Doppler was utilized to evaluate flow at rest and with distal augmentation maneuvers in the common femoral, femoral and popliteal veins. COMPARISON:  None. FINDINGS: Contralateral Common Femoral Vein: Respiratory phasicity is normal and symmetric with the symptomatic side. No evidence of thrombus. Normal compressibility. Common Femoral Vein: No evidence of thrombus. Normal compressibility, respiratory phasicity and response to augmentation. Saphenofemoral Junction: No evidence of thrombus. Normal compressibility and flow on color Doppler imaging. Profunda Femoral Vein: No evidence of thrombus. Normal compressibility and flow on color Doppler imaging. Femoral Vein: No evidence of thrombus. Normal compressibility, respiratory phasicity and response to augmentation. Popliteal Vein: No evidence of thrombus. Normal compressibility, respiratory phasicity and response to augmentation. Calf Veins: There appears to be nonocclusive thrombus in the posterior tibial vein. The peroneal vein was not visualized. Superficial Great Saphenous Vein: No evidence of thrombus. Normal compressibility. Venous Reflux:  None. Other Findings:  None. IMPRESSION: Study positive for a nonocclusive DVT in the posterior tibial vein. Electronically Signed   By: Constance Holster M.D.   On: 01/21/2019 19:43    Pending Labs Unresulted Labs (From admission, onward)   None      Vitals/Pain Today's Vitals   01/21/19 1850 01/21/19  1858 01/21/19 2041 01/21/19 2100  BP: (!) 179/116  (!) 183/114 (!) 186/102  Pulse: 83   95  Resp: 11   (!) 23  Temp:      TempSrc:      SpO2: 100%   97%  Weight:      Height:      PainSc:  0-No pain      Isolation Precautions No active isolations  Medications Medications  hydrALAZINE (APRESOLINE) injection 5 mg (5 mg Intravenous Given 01/21/19 2041)  heparin  ADULT infusion 100 units/mL (25000 units/247mL sodium chloride 0.45%) (1,650 Units/hr Intravenous New Bag/Given 01/21/19 2100)  iohexol (OMNIPAQUE) 350 MG/ML injection 100 mL (100 mLs Intravenous Contrast Given 01/21/19 2009)  heparin bolus via infusion 5,500 Units (5,500 Units Intravenous Bolus from Bag 01/21/19 2059)    Mobility walks Low fall risk   Focused Assessments Pulmonary Assessment Handoff:  Lung sounds: Bilateral Breath Sounds: Diminished L Breath Sounds: Diminished O2 Device: Nasal Cannula O2 Flow Rate (L/min): 2 L/min      R Recommendations: See Admitting Provider Note  Report given to:   Additional Notes:  PE, started on Heparin drip at 0905

## 2019-01-21 NOTE — ED Notes (Signed)
Patient transported to CT 

## 2019-01-21 NOTE — Telephone Encounter (Signed)
Left message for patient to call back.  His appointment has to be a virtual appointment due to the symptoms he is having. Please make sure the patient is aware.  CRM created.

## 2019-01-21 NOTE — ED Provider Notes (Addendum)
5:51 PM patient presents to the emergency department with complaint of worsening shortness of breath, leg swelling, cough.  Symptoms started in the past 1 to 2 months.  Patient was having a cough and intermittent fevers for which he followed up with his doctor.  Fevers resolved about 1 to 2 weeks ago.  Patient reports being fairly active but having difficulty with exercise.  He has noted bilateral lower extremity swelling, worse on the left ankle area.  Denies any active chest pain.  States that he had a tick on him once earlier in the summer but has not developed any rashes.  He has had a negative COVID test recently.  Patient seen in conjunction with Green PA-C.  Exam and history is concerning for new onset of congestive heart failure.  Chest x-ray with signs of edema and pleural effusions.  BNP is elevated to the 300s and troponin is 30's.  Unclear etiology of heart failure.  Consider hypertensive cardiomyopathy, viral cardiomyopathy.  Patient is currently comfortable with frequent coughing during exam.  He has 2-3+ edema in the left ankle area and 1-2+ edema of the left calf and right ankle and calf.  BP (!) 157/95   Pulse 83   Temp 98.7 F (37.1 C) (Oral)   Resp 17   Ht 6' (1.829 m)   Wt 113.4 kg   SpO2 97%   BMI 33.91 kg/m    Patient will be admitted to [facility].  7:30 PM Spoke with Dr. Blaine Hamper who accepts patient for admission.  BP (!) 179/116   Pulse 83   Temp 98.7 F (37.1 C) (Oral)   Resp 11   Ht 6' (1.829 m)   Wt 113.4 kg   SpO2 100%   BMI 33.91 kg/m   8:49 PM Small DVT/PE identified on work-up. IV heparin ordered. Awiting bed assignment and transfer. Updated wife and patient.   BP (!) 183/114   Pulse 83   Temp 98.7 F (37.1 C) (Oral)   Resp 11   Ht 6' (1.829 m)   Wt 113.4 kg   SpO2 100%   BMI 33.91 kg/m    CRITICAL CARE Performed by: Carlisle Cater PA-C Total critical care time: 45 minutes Critical care time was exclusive of separately billable procedures  and treating other patients. Critical care was necessary to treat or prevent imminent or life-threatening deterioration. Critical care was time spent personally by me on the following activities: development of treatment plan with patient and/or surrogate as well as nursing, discussions with consultants, evaluation of patient's response to treatment, examination of patient, obtaining history from patient or surrogate, ordering and performing treatments and interventions, ordering and review of laboratory studies, ordering and review of radiographic studies, pulse oximetry and re-evaluation of patient's condition.   Carlisle Cater, PA-C 01/21/19 1930    Carlisle Cater, PA-C 01/21/19 2050    Malvin Johns, MD 01/21/19 2232

## 2019-01-21 NOTE — Discharge Instructions (Addendum)
Please review attachment

## 2019-01-21 NOTE — ED Notes (Signed)
Patient transported to Ultrasound 

## 2019-01-21 NOTE — H&P (Signed)
History and Physical    Andrew Vaughn [id number] DOB: [birth year] DOA: 01/21/2019  Referring MD/NP/PA:   PCP: [name], MD   Patient coming from:  The patient is coming from home.  At baseline, pt is independent for most of ADL.        Chief Complaint: SOB  HPI: Andrew Vaughn is a 63 y.o. male with medical history significant of hypertension, glaucoma, erectile dysfunction, who presents with shortness of breath.  Pt states that he has been having shortness of breath for more than 1 month.  He has dry cough and intermittent fever recently, which has resolved. No chest pain. Has also has bilateral leg edema (the left leg is worse than the right). Pt was seen by PCP and had negative COVID-19 test on 01/07/2019. Patient was treated empirically with 10-day course of doxycycline by PCP. He just finished taking doxycycline yesterday, but no significant improvement of his SOB.  Patient denies any nausea, vomiting, diarrhea, abdominal pain, symptoms of UTI or unilateral weakness.  ED Course: pt was found to have troponin 36-39, BNP 341.1, negative COVID-19 test, electrolytes renal function okay, temperature normal, blood pressure 194/127, 179/116, heart rate 83, oxygen saturation 97-100% on room air, temperature normal.  Chest x-ray showed cardiomegaly with mild interstitial opacity which is likely due to edema and small bilateral pleural effusion.   # LE venous doppler of left leg is positive for a nonocclusive DVT in the posterior tibial vein. # CTA: showed 1. Very tiny nonocclusive pulmonary embolus involving the right lower lobe as detailed above. 2. Overall findings most concerning for congestive heart failure including moderate-sized bilateral pleural effusions. 3. Mediastinal adenopathy, presumably reactive. 4. There is a 1 cm rounded nodular opacity involving the right lower lobe as detailed above. This may represent a focus of atelectasis, infiltrate, or pulmonary nodule. A  three-month follow-up CT of the chest is recommended to confirm resolution of this finding. 5. Dilated ascending aorta measuring 4.2 cm.   Review of Systems:   General: had intermittent fevers recently, no chills, has fatigue HEENT: no blurry vision, hearing changes or sore throat Respiratory: has dyspnea, coughing, no wheezing CV: no chest pain, no palpitations GI: no nausea, vomiting, abdominal pain, diarrhea, constipation GU: no dysuria, burning on urination, increased urinary frequency, hematuria  Ext: has leg edema Neuro: no unilateral weakness, numbness, or tingling, no vision change or hearing loss Skin: no rash, no skin tear. MSK: No muscle spasm, no deformity, no limitation of range of movement in spin Heme: No easy bruising.  Travel history: No recent long distant travel.  Allergy:  Allergies  Allergen Reactions  . Erythromycin Rash    Past Medical History:  Diagnosis Date  . Glaucoma    per Dr. Ellie Lunch  . Hypertension     Past Surgical History:  Procedure Laterality Date  . dislocated left shoulder  1993  . right knee giant cell bone tumor  1998   [facility] Dr. Leonides Schanz at [facility]  . ruptured lumbar disc  1976  . torn meniscus left knee  8/08   Dr. French Ana    Social History:  reports that he has never smoked. He has never used smokeless tobacco. He reports current alcohol use. He reports that he does not use drugs.  Family History:  Family History  Problem Relation Age of Onset  . Hypertension Other   . Stroke Other        first degree male relative   . Heart disease  Other   . Glaucoma Other      Prior to Admission medications   Medication Sig Start Date End Date Taking? Authorizing Provider  doxycycline (VIBRAMYCIN) 100 MG capsule Take 1 capsule (100 mg total) by mouth 2 (two) times daily for 10 days. 01/11/19 01/21/19  [name], MD  hydrochlorothiazide (MICROZIDE) 12.5 MG capsule Take 1 capsule (12.5 mg total) by mouth daily. 08/13/18   [name], MD  losartan (COZAAR) 50 MG tablet Take 1 tablet (50 mg total) by mouth daily. 08/13/18   [name], MD  losartan-hydrochlorothiazide [facility]) 50-12.5 MG tablet TAKE 1 TABLET BY MOUTH EVERY DAY 01/18/18   [name], MD  travoprost, benzalkonium, (TRAVATAN) 0.004 % ophthalmic solution Place 1 drop into both eyes daily.      [provider]  VIAGRA 100 MG tablet TAKE 1 TABLET (100 MG TOTAL) BY MOUTH AS NEEDED FOR ERECTILE DYSFUNCTION. 12/23/15   [name], MD    Physical Exam: Vitals:   01/21/19 2100 01/21/19 2130 01/21/19 2145 01/21/19 2239  BP: (!) 186/102 (!) 167/90 (!) 170/91 (!) 171/100  Pulse: 95 95 92 97  Resp: (!) 23 18 19 18   Temp:    98.7 F (37.1 C)  TempSrc:    Oral  SpO2: 97% 96% 96% 100%  Weight:    111.1 kg  Height:    6' (1.829 m)   General: Not in acute distress HEENT:       Eyes: PERRL, EOMI, no scleral icterus.       ENT: No discharge from the ears and nose, no pharynx injection, no tonsillar enlargement.        Neck: No JVD, no bruit, no mass felt. Heme: No neck lymph node enlargement. Cardiac: S1/S2, RRR, No murmurs, No gallops or rubs. Respiratory: No rales, wheezing, rhonchi or rubs. GI: Soft, nondistended, nontender, no rebound pain, no organomegaly, BS present. GU: No hematuria Ext: has pitting leg edema bilaterally (left leg is worse than the right). 2+DP/PT pulse bilaterally. Musculoskeletal: No joint deformities, No joint redness or warmth, no limitation of ROM in spin. Skin: No rashes.  Neuro: Alert, oriented X3, cranial nerves II-XII grossly intact, moves all extremities normally. Psych: Patient is not psychotic, no suicidal or hemocidal ideation.  Labs on Admission: I have personally reviewed following labs and imaging studies  CBC: Recent Labs  Lab 01/21/19 1620  WBC 9.0  NEUTROABS 7.4  HGB 12.8*  HCT 40.8  MCV 88.7  PLT 123XX123   Basic Metabolic Panel: Recent Labs  Lab 01/21/19 1620  NA 139  K 4.0  CL 103   CO2 25  GLUCOSE 126*  BUN 20  CREATININE 1.10  CALCIUM 9.1   GFR: Estimated Creatinine Clearance: 90.8 mL/min (by C-G formula based on SCr of 1.1 mg/dL). Liver Function Tests: Recent Labs  Lab 01/21/19 1620  AST 30  ALT 25  ALKPHOS 102  BILITOT 1.7*  PROT 7.0  ALBUMIN 4.1   No results for input(s): LIPASE, AMYLASE in the last 168 hours. No results for input(s): AMMONIA in the last 168 hours. Coagulation Profile: No results for input(s): INR, PROTIME in the last 168 hours. Cardiac Enzymes: No results for input(s): CKTOTAL, CKMB, CKMBINDEX, TROPONINI in the last 168 hours. BNP (last 3 results) No results for input(s): PROBNP in the last 8760 hours. HbA1C: No results for input(s): HGBA1C in the last 72 hours. CBG: No results for input(s): GLUCAP in the last 168 hours. Lipid Profile: No  results for input(s): CHOL, HDL, LDLCALC, TRIG, CHOLHDL, LDLDIRECT in the last 72 hours. Thyroid Function Tests: No results for input(s): TSH, T4TOTAL, FREET4, T3FREE, THYROIDAB in the last 72 hours. Anemia Panel: No results for input(s): VITAMINB12, FOLATE, FERRITIN, TIBC, IRON, RETICCTPCT in the last 72 hours. Urine analysis:    Component Value Date/Time   COLORURINE yellow 07/14/2008 1012   APPEARANCEUR Clear 07/14/2008 1012   LABSPEC 1.015 07/14/2008 1012   PHURINE 7.0 07/14/2008 1012   HGBUR negative 07/14/2008 1012   BILIRUBINUR neg 03/17/2016 1044   PROTEINUR trace 03/17/2016 1044   UROBILINOGEN 0.2 03/17/2016 1044   UROBILINOGEN 0.2 07/14/2008 1012   NITRITE neg 03/17/2016 1044   NITRITE negative 07/14/2008 1012   LEUKOCYTESUR Negative 03/17/2016 1044   Sepsis Labs: @LABRCNTIP (procalcitonin:4,lacticidven:4) ) Recent Results (from the past 240 hour(s))  SARS Coronavirus 2 [facility] order, Performed in [facility] hospital lab) Nasopharyngeal Nasopharyngeal Swab     Status: None   Collection Time: 01/21/19  6:50 PM   Specimen: Nasopharyngeal Swab  Result Value Ref Range  Status   SARS Coronavirus 2 NEGATIVE NEGATIVE Final    Comment: (NOTE) If result is NEGATIVE SARS-CoV-2 target nucleic acids are NOT DETECTED. The SARS-CoV-2 RNA is generally detectable in upper and lower  respiratory specimens during the acute phase of infection. The lowest  concentration of SARS-CoV-2 viral copies this assay can detect is 250  copies / mL. A negative result does not preclude SARS-CoV-2 infection  and should not be used as the sole basis for treatment or other  patient management decisions.  A negative result may occur with  improper specimen collection / handling, submission of specimen other  than nasopharyngeal swab, presence of viral mutation(s) within the  areas targeted by this assay, and inadequate number of viral copies  (<250 copies / mL). A negative result must be combined with clinical  observations, patient history, and epidemiological information. If result is POSITIVE SARS-CoV-2 target nucleic acids are DETECTED. The SARS-CoV-2 RNA is generally detectable in upper and lower  respiratory specimens dur ing the acute phase of infection.  Positive  results are indicative of active infection with SARS-CoV-2.  Clinical  correlation with patient history and other diagnostic information is  necessary to determine patient infection status.  Positive results do  not rule out bacterial infection or co-infection with other viruses. If result is PRESUMPTIVE POSTIVE SARS-CoV-2 nucleic acids MAY BE PRESENT.   A presumptive positive result was obtained on the submitted specimen  and confirmed on repeat testing.  While 2019 novel coronavirus  (SARS-CoV-2) nucleic acids may be present in the submitted sample  additional confirmatory testing may be necessary for epidemiological  and / or clinical management purposes  to differentiate between  SARS-CoV-2 and other Sarbecovirus currently known to infect humans.  If clinically indicated additional testing with an alternate  test  methodology [phone]) is advised. The SARS-CoV-2 RNA is generally  detectable in upper and lower respiratory sp ecimens during the acute  phase of infection. The expected result is Negative. Fact Sheet for Patients:  StrictlyIdeas.no Fact Sheet for Healthcare Providers: BankingDealers.co.za This test is not yet approved or cleared by the [place] FDA and has been authorized for detection and/or diagnosis of SARS-CoV-2 by FDA under an Emergency Use Authorization (EUA).  This EUA will remain in effect (meaning this test can be used) for the duration of the COVID-19 declaration under Section 564(b)(1) of the Act, 21 U.S.C. section 360bbb-3(b)(1), unless the authorization is terminated or revoked  sooner. Performed at [facility], [address] [place], [place] [address]      Radiological Exams on Admission: Dg Chest 2 View  Result Date: 01/21/2019 CLINICAL DATA:  Cough, shortness of breath EXAM: CHEST - 2 VIEW COMPARISON:  None. FINDINGS: Cardiomegaly. Mild diffuse interstitial pulmonary opacity and small bilateral pleural effusions. The visualized skeletal structures are unremarkable. IMPRESSION: Cardiomegaly. Mild diffuse interstitial pulmonary opacity and small bilateral pleural effusions, most consistent with edema. No focal airspace opacity. Electronically Signed   By: Eddie Candle M.D.   On: 01/21/2019 15:47   Ct Angio Chest Pe W And/or Wo Contrast  Result Date: 01/21/2019 CLINICAL DATA:  Concern for PE.  Positive DVT study. EXAM: CT ANGIOGRAPHY CHEST WITH CONTRAST TECHNIQUE: Multidetector CT imaging of the chest was performed using the standard protocol during bolus administration of intravenous contrast. Multiplanar CT image reconstructions and MIPs were obtained to evaluate the vascular anatomy. CONTRAST:  131mL OMNIPAQUE IOHEXOL 350 MG/ML SOLN COMPARISON:  None. FINDINGS: Cardiovascular: Contrast injection is  sufficient to demonstrate satisfactory opacification of the pulmonary arteries to the segmental level.Findings are concerning for a small pulmonary embolus involving a segmental branch of the right lower lobe (axial series 6, image 183). No additional pulmonary emboli identified. The main pulmonary artery is within normal limits for size. There is no CT evidence of acute right heart strain. The ascending aorta is dilated measuring approximately 4.2 cm in diameter. The heart size is enlarged. There is a trace pericardial effusion. There is reflux of contrast into the IVC. Mediastinum/Nodes: --there are enlarged mediastinal and hilar lymph nodes. For example there is a right paratracheal lymph node measuring approximately 1.3 cm. --No axillary lymphadenopathy. --No supraclavicular lymphadenopathy. --Normal thyroid gland. --The esophagus is unremarkable Lungs/Pleura: There are moderate bilateral pleural effusions. There is diffuse interlobular septal thickening. There are areas of atelectasis at the right lung base. There is a rounded opacity involving the right lower lobe measuring approximately 1 cm (axial series 5, image 74). There is no pneumothorax. Upper Abdomen: No acute abnormality. Musculoskeletal: No chest wall abnormality. No acute or significant osseous findings. Review of the MIP images confirms the above findings. IMPRESSION: 1. Very tiny nonocclusive pulmonary embolus involving the right lower lobe as detailed above. 2. Overall findings most concerning for congestive heart failure including moderate-sized bilateral pleural effusions. 3. Mediastinal adenopathy, presumably reactive. 4. There is a 1 cm rounded nodular opacity involving the right lower lobe as detailed above. This may represent a focus of atelectasis, infiltrate, or pulmonary nodule. A three-month follow-up CT of the chest is recommended to confirm resolution of this finding. 5. Dilated ascending aorta measuring 4.2 cm. Recommend annual  imaging followup by CTA or MRA. This recommendation follows 2010 ACCF/AHA/AATS/ACR/ASA/SCA/SCAI/SIR/STS/SVM Guidelines for the Diagnosis and Management of Patients with Thoracic Aortic Disease. Circulation. 2010; [id number]. Aortic aneurysm NOS (ICD10-I71.9) These results were called by telephone at the time of interpretation on 01/21/2019 at 8:32 pm to the ED attending working with Carlisle Cater , who verbally acknowledged these results. Electronically Signed   By: Constance Holster M.D.   On: 01/21/2019 20:35   US Venous Img Lower  Left (dvt Study)  Result Date: 01/21/2019 CLINICAL DATA:  Left lower extremity pain EXAM: LEFT LOWER EXTREMITY VENOUS DOPPLER ULTRASOUND TECHNIQUE: Gray-scale sonography with graded compression, as well as color Doppler and duplex ultrasound were performed to evaluate the lower extremity deep venous systems from the level of the common femoral vein and including the common femoral,  femoral, profunda femoral, popliteal and calf veins including the posterior tibial, peroneal and gastrocnemius veins when visible. The superficial great saphenous vein was also interrogated. Spectral Doppler was utilized to evaluate flow at rest and with distal augmentation maneuvers in the common femoral, femoral and popliteal veins. COMPARISON:  None. FINDINGS: Contralateral Common Femoral Vein: Respiratory phasicity is normal and symmetric with the symptomatic side. No evidence of thrombus. Normal compressibility. Common Femoral Vein: No evidence of thrombus. Normal compressibility, respiratory phasicity and response to augmentation. Saphenofemoral Junction: No evidence of thrombus. Normal compressibility and flow on color Doppler imaging. Profunda Femoral Vein: No evidence of thrombus. Normal compressibility and flow on color Doppler imaging. Femoral Vein: No evidence of thrombus. Normal compressibility, respiratory phasicity and response to augmentation. Popliteal Vein: No evidence of thrombus.  Normal compressibility, respiratory phasicity and response to augmentation. Calf Veins: There appears to be nonocclusive thrombus in the posterior tibial vein. The peroneal vein was not visualized. Superficial Great Saphenous Vein: No evidence of thrombus. Normal compressibility. Venous Reflux:  None. Other Findings:  None. IMPRESSION: Study positive for a nonocclusive DVT in the posterior tibial vein. Electronically Signed   By: Constance Holster M.D.   On: 01/21/2019 19:43     EKG: Independently reviewed.  Sinus rhythm, QTC 461, low voltage, LAE, occasional PVC, poor R wave progression, anteroseptal infarction pattern, mild T wave inversion in lead III/aVF.   Assessment/Plan Principal Problem:   PE (pulmonary thromboembolism) (HCC) Active Problems:   Essential hypertension   Elevated troponin   Hypertensive urgency   Acute CHF (congestive heart failure) (HCC)   DVT (deep venous thrombosis) (HCC)   PE (pulmonary thromboembolism) and DVT:  LE venous doppler of left leg is positive for a nonocclusive DVT in the posterior tibial vein. CTA showed very tiny nonocclusive pulmonary embolus involving the right lower lobe.  Currently hemodynamically stable.  No oxygen desaturation.  -admit to tele bed as inpt -heparin drip initiated -2D echocardiogram (is also for possible acute CHF) -prn albuterol nebs and mucinex  Possible acute CHF (congestive heart failure) and elevated trop: pt has elevated BNP 341, bilateral leg edema and pulmonary edema on chest x-ray, clinically consistent with CHF.  Not sure which type of CHF. Given history of hypertension, patient may have diastolic CHF. Pt has elevated troponin 36, 39.  No chest pain, likely due to demand ischemia secondary to PE and possible acute CHF.  -Lasix 20 mg daily by IV -start ASA 81 mg daily -trop x 3 -2d echo -Daily weights -Low salt diet -Fluid restriction - Repeat EKG in the am  - prn Nitroglycerin, Morphine - Risk factor  stratification: will check FLP and A1C  - check UDS - inpt card consult was requested via Epic  Essential hypertension and Hypertensive urgency: Bp 194/12, 179/116. -continue home Cozaar and HCTZ -IV hydralazine as needed -Patient was started with IV Lasix as above  Nodular opacity in RLL: CTA showed 1 cm rounded nodular opacity involving the right lower lobe as detailed above. This may represent a focus of atelectasis, infiltrate, or pulmonary nodule.  -may need three-month follow-up CT of the chest to confirm resolution of this finding.  Dilated ascending aorta: CTA showed dilated ascending aorta measuring 4.2 cm. -f/u with PCP    Inpatient status:  # Patient requires inpatient status due to high intensity of service, high risk for further deterioration and high frequency of surveillance required.  I certify that at the point of admission it is my clinical judgment that the  patient will require inpatient hospital care spanning beyond 2 midnights from the point of admission.  . This patient has multiple chronic comorbidities including  hypertension, glaucoma, erectile  . Now patient has presenting with pulmonary embolism, acute DVT, possible acute CHF, elevated troponin, hypertensive urgency . The worrisome physical exam findings include bilateral leg edema (left leg is worse than the right) . The initial radiographic and laboratory data are worrisome because of elevated BNP, elevated troponin, pulmonary edema on chest x-ray, positive DVT by left leg lower extremity Doppler, CT angiogram showed acute PE. . Current medical needs: please see my assessment and plan . Predictability of an adverse outcome (risk): Patient has multiple chronic issues, now presents with pulmonary embolism, acute DVT, possible acute CHF, hypertensive urgency and elevated troponin. Pt will need further cardiac work-up for acute CHF.  Patient is at high risk for deteriorating, will need to be treated in hospital for  at least 2 days.   DVT ppx: IV Heparin    Code Status: Full code Family Communication: None at bed side.  Disposition Plan:  Anticipate discharge back to previous home environment Consults called:  none Admission status:   Inpatient/tele     Date of Service 01/21/2019    [facility]   If 7PM-7AM, please contact night-coverage [email or web address] Password [facility] 01/21/2019, 11:44 PM

## 2019-01-21 NOTE — Telephone Encounter (Signed)
Copied from [place] [id number]. Topic: General - Other >> Jan 21, 2019  8:58 AM Leward Quan A wrote: Reason for CRM: Patient called to say that he just finished the antibiotics after 10 days but is still not feeling any better, Still coughing, congested, has sinus drainage and swelling in left ankle about one week. Patient scheduled for 01/23/2019 but would like to be seen today if possible. Please call Ph#  [phone]

## 2019-01-21 NOTE — ED Notes (Signed)
Charge RN for new floor could not take report. To call back later. Nurse is in a patient room. [facility] here for transport.

## 2019-01-22 ENCOUNTER — Inpatient Hospital Stay (HOSPITAL_COMMUNITY): Payer: BC Managed Care – PPO

## 2019-01-22 ENCOUNTER — Encounter (HOSPITAL_COMMUNITY): Payer: Self-pay | Admitting: Medical

## 2019-01-22 DIAGNOSIS — I340 Nonrheumatic mitral (valve) insufficiency: Secondary | ICD-10-CM

## 2019-01-22 DIAGNOSIS — R7989 Other specified abnormal findings of blood chemistry: Secondary | ICD-10-CM

## 2019-01-22 DIAGNOSIS — I5041 Acute combined systolic (congestive) and diastolic (congestive) heart failure: Secondary | ICD-10-CM

## 2019-01-22 DIAGNOSIS — E785 Hyperlipidemia, unspecified: Secondary | ICD-10-CM

## 2019-01-22 DIAGNOSIS — I160 Hypertensive urgency: Secondary | ICD-10-CM

## 2019-01-22 DIAGNOSIS — I82402 Acute embolism and thrombosis of unspecified deep veins of left lower extremity: Secondary | ICD-10-CM

## 2019-01-22 DIAGNOSIS — I361 Nonrheumatic tricuspid (valve) insufficiency: Secondary | ICD-10-CM

## 2019-01-22 LAB — LIPID PANEL
Cholesterol: 130 mg/dL (ref 0–200)
HDL: 29 mg/dL — ABNORMAL LOW (ref >40)
LDL Cholesterol: 92 mg/dL (ref 0–99)
Total CHOL/HDL Ratio: 4.5 RATIO
Triglycerides: 43 mg/dL (ref <150)
VLDL: 9 mg/dL (ref 0–40)

## 2019-01-22 LAB — BASIC METABOLIC PANEL
Anion gap: 12 (ref 5–15)
BUN: 13 mg/dL (ref 8–23)
CO2: 23 mmol/L (ref 22–32)
Calcium: 9.2 mg/dL (ref 8.9–10.3)
Chloride: 102 mmol/L (ref 98–111)
Creatinine, Ser: 0.95 mg/dL (ref 0.61–1.24)
GFR calc Af Amer: >60 mL/min (ref >60)
GFR calc non Af Amer: >60 mL/min (ref >60)
Glucose, Bld: 114 mg/dL — ABNORMAL HIGH (ref 70–99)
Potassium: 3.5 mmol/L (ref 3.5–5.1)
Sodium: 137 mmol/L (ref 135–145)

## 2019-01-22 LAB — HEPARIN LEVEL (UNFRACTIONATED)
Heparin Unfractionated: 0.29 IU/mL — ABNORMAL LOW (ref 0.30–0.70)
Heparin Unfractionated: 0.49 IU/mL (ref 0.30–0.70)

## 2019-01-22 LAB — ECHOCARDIOGRAM COMPLETE
Height: 72 in
Weight: 3806.02 oz

## 2019-01-22 LAB — HEMOGLOBIN A1C
Hgb A1c MFr Bld: 5.5 % (ref 4.8–5.6)
Mean Plasma Glucose: 111.15 mg/dL

## 2019-01-22 LAB — RAPID URINE DRUG SCREEN, HOSP PERFORMED
Amphetamines: NOT DETECTED
Barbiturates: NOT DETECTED
Benzodiazepines: NOT DETECTED
Cocaine: NOT DETECTED
Opiates: NOT DETECTED
Tetrahydrocannabinol: NOT DETECTED

## 2019-01-22 LAB — HIV ANTIBODY (ROUTINE TESTING W REFLEX): HIV Screen 4th Generation wRfx: NONREACTIVE

## 2019-01-22 LAB — TROPONIN I (HIGH SENSITIVITY)
Troponin I (High Sensitivity): 43 ng/L — ABNORMAL HIGH (ref <18)
Troponin I (High Sensitivity): 43 ng/L — ABNORMAL HIGH (ref <18)
Troponin I (High Sensitivity): 53 ng/L — ABNORMAL HIGH (ref <18)

## 2019-01-22 MED ORDER — SODIUM CHLORIDE 0.9% FLUSH
10.0000 mL | Freq: Two times a day (BID) | INTRAVENOUS | Status: DC
  Administered 2019-01-22 – 2019-01-23 (×3): 10 mL

## 2019-01-22 MED ORDER — METOPROLOL TARTRATE 50 MG PO TABS: 50.0000 mg | ORAL_TABLET | Freq: Once | ORAL | Status: DC

## 2019-01-22 MED ORDER — CARVEDILOL 12.5 MG PO TABS
12.5000 mg | ORAL_TABLET | Freq: Two times a day (BID) | ORAL | Status: DC
  Administered 2019-01-22 – 2019-01-24 (×4): 12.5 mg via ORAL
  Filled 2019-01-22 (×4): qty 1

## 2019-01-22 MED ORDER — METOPROLOL TARTRATE 50 MG PO TABS
50.0000 mg | ORAL_TABLET | Freq: Once | ORAL | Status: DC | PRN
  Filled 2019-01-22: qty 1

## 2019-01-22 MED ORDER — ATORVASTATIN CALCIUM 10 MG PO TABS
20.0000 mg | ORAL_TABLET | Freq: Every day | ORAL | Status: DC
  Administered 2019-01-22 – 2019-01-23 (×2): 20 mg via ORAL
  Filled 2019-01-22 (×2): qty 2

## 2019-01-22 MED ORDER — POTASSIUM CHLORIDE CRYS ER 20 MEQ PO TBCR
60.0000 meq | EXTENDED_RELEASE_TABLET | Freq: Once | ORAL | Status: AC
  Administered 2019-01-22: 60 meq via ORAL
  Filled 2019-01-22: qty 3

## 2019-01-22 MED ORDER — HYDRALAZINE HCL 10 MG PO TABS
10.0000 mg | ORAL_TABLET | Freq: Three times a day (TID) | ORAL | Status: DC
  Administered 2019-01-22 – 2019-01-23 (×3): 10 mg via ORAL
  Filled 2019-01-22 (×3): qty 1

## 2019-01-22 MED ORDER — SODIUM CHLORIDE 0.9% FLUSH: 10.0000 mL | INTRAVENOUS | Status: DC | PRN

## 2019-01-22 NOTE — Progress Notes (Signed)
  Echocardiogram 2D Echocardiogram has been performed.  Keela Rubert L Androw 01/22/2019, 10:34 AM

## 2019-01-22 NOTE — Progress Notes (Signed)
Patient given patient education handouts on heart healthy diet, seasoning with out salt, and new medications that have been ordered. Will monitor patient. Shelley Pooley, [name] RN

## 2019-01-22 NOTE — Progress Notes (Signed)
[facility]                                                                              Patient Demographics  Andrew Vaughn, is a 63 y.o. male, DOB - Feb 28, 1956, [id number]  Admit date - 01/21/2019   Admitting Physician Ivor Costa, MD  Outpatient Primary MD for the patient is [name], MD  Outpatient specialists:   LOS - 1  days   Medical records reviewed and are as summarized below:    Chief Complaint  Patient presents with   Shortness of Breath       Brief summary   Patient is 63 year old male with history of hypertension, glaucoma, erectile dysfunction presented with shortness of breath.  Patient reported shortness of breath for more than 1 month.  He has dry cough and intermittent fever recently, which has resolved. No chest pain. Has also has bilateral leg edema (theleft legisworse thantheright). Pt was seen by PCP and had negative COVID-19 test on 01/07/2019. Patient was treated empirically with 10-day course of doxycycline by PCP. He just finished taking doxycycline day before the admission on 8/30 but no significant improvement.   In ED, BNP 341, negative COVID-19 test.  O2 sats 97% on room air.  Chest x-ray showed cardiomegaly with mild interstitial opacity likely due to edema and small bilateral pleural effusion.  BP was elevated Lower extremity venous Doppler showed positive nonocclusive DVT in the posterior tibial vein  CT angiogram of the chest showed small nonocclusive pulmonary embolus involving the right lower lobe, findings most concerning for congestive heart failure including moderate sized bilateral pleural effusions, mediastinal adenopathy presumably reactive.  1 cm rounded nodular opacity involving the right lower lobe, may represent atelectasis, infiltrate or pulmonary nodule, recommending 56-month follow-up CT chest.  Dilated ascending aorta 4.2 cm  Assessment & Plan    Principal Problem:  Acute Right lung PE (pulmonary  thromboembolism) ([place]), acute DVT left lower extremity -Patient has been mostly sedentary in the last few months, working long hours as a Engineer, technical sales possibly provoking the DVT and PE. -Continue IV heparin drip -Follow 2D echo -Discussed in detail with the patient regarding Lovenox/Coumadin versus NOAC's.  Patient prefers NOAC's.  Placed Case management consult.  Active Problems:  Bilateral pleural effusions with acute CHF -BNP elevated 341 with bilateral leg edema and pulmonary edema on chest x-ray, consistent with CHF, EF unknown until echo available. -Troponins mildly elevated likely due to demand ischemia secondary to PE and possible acute CHF -Continue to trend troponins, for now continue aspirin 81 mg daily -Strict I's and O's and daily weights, continue IV Lasix 20 mg daily, losartan hold HCTZ -Per admitting physician, cardiology has been consulted  Hypertensive urgency: 179/116 at the time of admission -BP still elevated, continue IV Lasix -Continue losartan, will place on oral hydralazine 10 mg 3 times daily -Continue IV hydralazine as needed with parameters  Nodular opacity right lower lobe -CTA showed 1 cm rounded nodular opacity involving the right lower lobe as detailed above. This may represent a focus of atelectasis, infiltrate, or pulmonary nodule.  -Needs 30-month follow-up of the CT chest to confirm  resolution of this finding   Dilated ascending aorta -  CTA showed dilated ascending aorta measuring 4.2 cm. -Outpatient follow-up with PCP  Code Status: Full code DVT Prophylaxis: Heparin drip Family Communication: Discussed all imaging results, lab results, explained to the patient   Disposition Plan: Will place case management consult regarding NOAC's.  2D echo pending.  Cardiology evaluation pending.  BP still elevated, needs IV diuresis.  Will remain inpatient.  Time Spent in minutes 35 minutes  Procedures:  CT angiogram of the chest  Consultants:     Cardiology  Antimicrobials:   Anti-infectives (From admission, onward)   None          Medications  Scheduled Meds:  aspirin EC  81 mg Oral Daily   furosemide  20 mg Intravenous Daily   hydrochlorothiazide  12.5 mg Oral Daily   losartan  50 mg Oral Daily   sodium chloride flush  3 mL Intravenous Q12H   Travoprost (BAK Free)  1 drop Both Eyes QHS   Continuous Infusions:  sodium chloride     heparin 1,850 Units/hr (01/22/19 1005)   PRN Meds:.sodium chloride, acetaminophen, albuterol, dextromethorphan-guaiFENesin, hydrALAZINE, morphine injection, nitroGLYCERIN, ondansetron (ZOFRAN) IV, sodium chloride flush      Subjective:   Andrew Vaughn was seen and examined today.  Per patient left leg swelling is improving, no significant pain.  No chest pain.  Sitting up in the chair. Patient denies dizziness, abdominal pain, N/V/D/C, new weakness, numbess, tingling. No acute events overnight.    Objective:   Vitals:   01/22/19 0205 01/22/19 0534 01/22/19 0811 01/22/19 1053  BP: (!) 171/105 (!) 172/108 (!) 177/102 (!) 174/103  Pulse: 88 85 79 78  Resp: 18 (!) 21 15 19   Temp: 98.5 F (36.9 C) 98.4 F (36.9 C) 98.2 F (36.8 C)   TempSrc: Oral Oral Oral   SpO2: 93% 95% 97% 94%  Weight:  107.9 kg    Height:        Intake/Output Summary (Last 24 hours) at 01/22/2019 1106 Last data filed at 01/22/2019 1005 Gross per 24 hour  Intake 638.59 ml  Output 4450 ml  Net -3811.41 ml     Wt Readings from Last 3 Encounters:  01/22/19 107.9 kg  09/19/17 113.2 kg  03/22/16 108.9 kg     Exam  General: Alert and oriented x 3, NAD  Eyes:  HEENT:  Atraumatic, normocephalic, normal oropharynx  Cardiovascular: S1 S2 auscultated, no murmurs, RRR  Respiratory: Decreased breath sound at the base  Gastrointestinal: Soft, nontender, nondistended, + bowel sounds  Ext: Bilateral 1+ edema, left > right  Neuro: AAOx3, Cr N's II- XII. Strength 5/5 upper and lower  extremities bilaterally  Musculoskeletal: No digital cyanosis, clubbing  Skin: No rashes  Psych: Normal affect and demeanor, alert and oriented x3    Data Reviewed:  I have personally reviewed following labs and imaging studies  Micro Results Recent Results (from the past 240 hour(s))  SARS Coronavirus 2 [facility] order, Performed in [facility] hospital lab) Nasopharyngeal Nasopharyngeal Swab     Status: None   Collection Time: 01/21/19  6:50 PM   Specimen: Nasopharyngeal Swab  Result Value Ref Range Status   SARS Coronavirus 2 NEGATIVE NEGATIVE Final    Comment: (NOTE) If result is NEGATIVE SARS-CoV-2 target nucleic acids are NOT DETECTED. The SARS-CoV-2 RNA is generally detectable in upper and lower  respiratory specimens during the acute phase of infection. The lowest  concentration of SARS-CoV-2 viral copies this assay can  detect is 250  copies / mL. A negative result does not preclude SARS-CoV-2 infection  and should not be used as the sole basis for treatment or other  patient management decisions.  A negative result may occur with  improper specimen collection / handling, submission of specimen other  than nasopharyngeal swab, presence of viral mutation(s) within the  areas targeted by this assay, and inadequate number of viral copies  (<250 copies / mL). A negative result must be combined with clinical  observations, patient history, and epidemiological information. If result is POSITIVE SARS-CoV-2 target nucleic acids are DETECTED. The SARS-CoV-2 RNA is generally detectable in upper and lower  respiratory specimens dur ing the acute phase of infection.  Positive  results are indicative of active infection with SARS-CoV-2.  Clinical  correlation with patient history and other diagnostic information is  necessary to determine patient infection status.  Positive results do  not rule out bacterial infection or co-infection with other viruses. If result is PRESUMPTIVE  POSTIVE SARS-CoV-2 nucleic acids MAY BE PRESENT.   A presumptive positive result was obtained on the submitted specimen  and confirmed on repeat testing.  While 2019 novel coronavirus  (SARS-CoV-2) nucleic acids may be present in the submitted sample  additional confirmatory testing may be necessary for epidemiological  and / or clinical management purposes  to differentiate between  SARS-CoV-2 and other Sarbecovirus currently known to infect humans.  If clinically indicated additional testing with an alternate test  methodology [phone]) is advised. The SARS-CoV-2 RNA is generally  detectable in upper and lower respiratory sp ecimens during the acute  phase of infection. The expected result is Negative. Fact Sheet for Patients:  StrictlyIdeas.no Fact Sheet for Healthcare Providers: BankingDealers.co.za This test is not yet approved or cleared by the [place] FDA and has been authorized for detection and/or diagnosis of SARS-CoV-2 by FDA under an Emergency Use Authorization (EUA).  This EUA will remain in effect (meaning this test can be used) for the duration of the COVID-19 declaration under Section 564(b)(1) of the Act, 21 U.S.C. section 360bbb-3(b)(1), unless the authorization is terminated or revoked sooner. Performed at [facility], [address] [place], [place] [address]     Radiology Reports Dg Chest 2 View  Result Date: 01/21/2019 CLINICAL DATA:  Cough, shortness of breath EXAM: CHEST - 2 VIEW COMPARISON:  None. FINDINGS: Cardiomegaly. Mild diffuse interstitial pulmonary opacity and small bilateral pleural effusions. The visualized skeletal structures are unremarkable. IMPRESSION: Cardiomegaly. Mild diffuse interstitial pulmonary opacity and small bilateral pleural effusions, most consistent with edema. No focal airspace opacity. Electronically Signed   By: Eddie Candle M.D.   On: 01/21/2019 15:47   Ct Angio  Chest Pe W And/or Wo Contrast  Result Date: 01/21/2019 CLINICAL DATA:  Concern for PE.  Positive DVT study. EXAM: CT ANGIOGRAPHY CHEST WITH CONTRAST TECHNIQUE: Multidetector CT imaging of the chest was performed using the standard protocol during bolus administration of intravenous contrast. Multiplanar CT image reconstructions and MIPs were obtained to evaluate the vascular anatomy. CONTRAST:  152mL OMNIPAQUE IOHEXOL 350 MG/ML SOLN COMPARISON:  None. FINDINGS: Cardiovascular: Contrast injection is sufficient to demonstrate satisfactory opacification of the pulmonary arteries to the segmental level.Findings are concerning for a small pulmonary embolus involving a segmental branch of the right lower lobe (axial series 6, image 183). No additional pulmonary emboli identified. The main pulmonary artery is within normal limits for size. There is no CT evidence of acute right heart strain. The ascending  aorta is dilated measuring approximately 4.2 cm in diameter. The heart size is enlarged. There is a trace pericardial effusion. There is reflux of contrast into the IVC. Mediastinum/Nodes: --there are enlarged mediastinal and hilar lymph nodes. For example there is a right paratracheal lymph node measuring approximately 1.3 cm. --No axillary lymphadenopathy. --No supraclavicular lymphadenopathy. --Normal thyroid gland. --The esophagus is unremarkable Lungs/Pleura: There are moderate bilateral pleural effusions. There is diffuse interlobular septal thickening. There are areas of atelectasis at the right lung base. There is a rounded opacity involving the right lower lobe measuring approximately 1 cm (axial series 5, image 74). There is no pneumothorax. Upper Abdomen: No acute abnormality. Musculoskeletal: No chest wall abnormality. No acute or significant osseous findings. Review of the MIP images confirms the above findings. IMPRESSION: 1. Very tiny nonocclusive pulmonary embolus involving the right lower lobe as  detailed above. 2. Overall findings most concerning for congestive heart failure including moderate-sized bilateral pleural effusions. 3. Mediastinal adenopathy, presumably reactive. 4. There is a 1 cm rounded nodular opacity involving the right lower lobe as detailed above. This may represent a focus of atelectasis, infiltrate, or pulmonary nodule. A three-month follow-up CT of the chest is recommended to confirm resolution of this finding. 5. Dilated ascending aorta measuring 4.2 cm. Recommend annual imaging followup by CTA or MRA. This recommendation follows 2010 ACCF/AHA/AATS/ACR/ASA/SCA/SCAI/SIR/STS/SVM Guidelines for the Diagnosis and Management of Patients with Thoracic Aortic Disease. Circulation. 2010; [id number]. Aortic aneurysm NOS (ICD10-I71.9) These results were called by telephone at the time of interpretation on 01/21/2019 at 8:32 pm to the ED attending working with Carlisle Cater , who verbally acknowledged these results. Electronically Signed   By: Constance Holster M.D.   On: 01/21/2019 20:35   US Venous Img Lower  Left (dvt Study)  Result Date: 01/21/2019 CLINICAL DATA:  Left lower extremity pain EXAM: LEFT LOWER EXTREMITY VENOUS DOPPLER ULTRASOUND TECHNIQUE: Gray-scale sonography with graded compression, as well as color Doppler and duplex ultrasound were performed to evaluate the lower extremity deep venous systems from the level of the common femoral vein and including the common femoral, femoral, profunda femoral, popliteal and calf veins including the posterior tibial, peroneal and gastrocnemius veins when visible. The superficial great saphenous vein was also interrogated. Spectral Doppler was utilized to evaluate flow at rest and with distal augmentation maneuvers in the common femoral, femoral and popliteal veins. COMPARISON:  None. FINDINGS: Contralateral Common Femoral Vein: Respiratory phasicity is normal and symmetric with the symptomatic side. No evidence of thrombus. Normal  compressibility. Common Femoral Vein: No evidence of thrombus. Normal compressibility, respiratory phasicity and response to augmentation. Saphenofemoral Junction: No evidence of thrombus. Normal compressibility and flow on color Doppler imaging. Profunda Femoral Vein: No evidence of thrombus. Normal compressibility and flow on color Doppler imaging. Femoral Vein: No evidence of thrombus. Normal compressibility, respiratory phasicity and response to augmentation. Popliteal Vein: No evidence of thrombus. Normal compressibility, respiratory phasicity and response to augmentation. Calf Veins: There appears to be nonocclusive thrombus in the posterior tibial vein. The peroneal vein was not visualized. Superficial Great Saphenous Vein: No evidence of thrombus. Normal compressibility. Venous Reflux:  None. Other Findings:  None. IMPRESSION: Study positive for a nonocclusive DVT in the posterior tibial vein. Electronically Signed   By: Constance Holster M.D.   On: 01/21/2019 19:43    Lab Data:  CBC: Recent Labs  Lab 01/21/19 1620  WBC 9.0  NEUTROABS 7.4  HGB 12.8*  HCT 40.8  MCV 88.7  PLT 216  Basic Metabolic Panel: Recent Labs  Lab 01/21/19 1620 01/22/19 0307  NA 139 137  K 4.0 3.5  CL 103 102  CO2 25 23  GLUCOSE 126* 114*  BUN 20 13  CREATININE 1.10 0.95  CALCIUM 9.1 9.2   GFR: Estimated Creatinine Clearance: 103.6 mL/min (by C-G formula based on SCr of 0.95 mg/dL). Liver Function Tests: Recent Labs  Lab 01/21/19 1620  AST 30  ALT 25  ALKPHOS 102  BILITOT 1.7*  PROT 7.0  ALBUMIN 4.1   No results for input(s): LIPASE, AMYLASE in the last 168 hours. No results for input(s): AMMONIA in the last 168 hours. Coagulation Profile: No results for input(s): INR, PROTIME in the last 168 hours. Cardiac Enzymes: No results for input(s): CKTOTAL, CKMB, CKMBINDEX, TROPONINI in the last 168 hours. BNP (last 3 results) No results for input(s): PROBNP in the last 8760  hours. HbA1C: Recent Labs    01/22/19 0307  HGBA1C 5.5   CBG: No results for input(s): GLUCAP in the last 168 hours. Lipid Profile: Recent Labs    01/22/19 0307  CHOL 130  HDL 29*  LDLCALC 92  TRIG 43  CHOLHDL 4.5   Thyroid Function Tests: No results for input(s): TSH, T4TOTAL, FREET4, T3FREE, THYROIDAB in the last 72 hours. Anemia Panel: No results for input(s): VITAMINB12, FOLATE, FERRITIN, TIBC, IRON, RETICCTPCT in the last 72 hours. Urine analysis:    Component Value Date/Time   COLORURINE yellow 07/14/2008 1012   APPEARANCEUR Clear 07/14/2008 1012   LABSPEC 1.015 07/14/2008 1012   PHURINE 7.0 07/14/2008 1012   HGBUR negative 07/14/2008 1012   BILIRUBINUR neg 03/17/2016 1044   PROTEINUR trace 03/17/2016 1044   UROBILINOGEN 0.2 03/17/2016 1044   UROBILINOGEN 0.2 07/14/2008 1012   NITRITE neg 03/17/2016 1044   NITRITE negative 07/14/2008 1012   LEUKOCYTESUR Negative 03/17/2016 1044     Emily Massar M.D. [facility] 01/22/2019, 11:06 AM  Pager: [phone] Between 7am to 7pm - call Pager - [phone]  After 7pm go to [email or web address] - password [email or web address]  Call night coverage person covering after 7pm

## 2019-01-22 NOTE — Progress Notes (Signed)
Dr. Tana Coast Paged through [facility] system about patient NPO status and diet order. Will monitor patient. Larrell Rapozo, [name] RN

## 2019-01-22 NOTE — Consult Note (Addendum)
Cardiology Consultation:   Patient ID: Andrew Vaughn; [id number]; 1955/08/18   Admit date: 01/21/2019 Date of Consult: 01/22/2019  Primary Care Provider: [name], MD Primary Cardiologist: New to [facility]; Dr. Gardiner Rhyme Primary Electrophysiologist:  None  Patient Profile:   Andrew Vaughn is a 63 y.o. male with a PMH of HTN and glaucoma who is being seen today for the evaluation of CHF and elevated troponin at the request of Dr. Blaine Hamper.  History of Present Illness:   Mr. Arciero presented with complaints of DOE for the past 2 months. He reported having a fever, cough, and SOB in early July. He continue to have intermittent fevers with persistent SOB and cough throughout the next month. He was seen by his PCP ~10 days ago for these symptoms and he was given doxycycline (completed 10 day course 01/21/2019). COVID-19 was negative at that time. He reported resolution of fever but no improvement in SOB or cough, prompting him to present to the ED for further evaluation.   He denies prior cardiac history besides HTN. He has never undergone an ischemic evaluation or an echocardiogram. He reports his father had a history of CAD with first MI in his 75s, CHF, and atrial fibrillation.    At the time of this evaluation he reports breathing is stable, though he has not exerted himself to assess DOE. He has also been experiencing LE edema, L>>R, for the past month. No complaints of chest pain, palpitations, orthopnea, or PND. His cough has been dry and he reports taking Nyquil at bedtime to help with sleep. He denies any SOB at rest or when walking on a flat surface. He experiences DOE when going up stairs or walking up a hill which quickly recovers with rest. He reports high salt intake with eating out at restaurants regularly. He does not monitor his blood pressure routinely but reports it was normal at his PCP visit last year. 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 He has had a poor appetite recently but no change in smell/taste. He denies dizziness, lightheadedness, syncope, nausea, vomiting, or diarrhea.   Hospital course: persistently hypertensive (max 194/127), otherwise VSS. Labs notable electrolytes wnl, Cr 1.1>0.95, Hgb 12.8, PLT 216, BNP 341, Trop 36>39>43>43>53, LDL 92, A1C 5.5. COVID 19 negative. EKG with sinus rhythm with rate 78bpm, QTc 461, PVC, non-specific T wave abnormalities, no STE/D. Left LE doppler with nonocclusive DVT in the posterior tibial vein. CTA chest with very tiny nonocclusive PE in the RLL with evidence of CHF with moderate bilateral pleural effusions, and incidental 1cm nodule recommended for f/u CT in 3 months and dilation of ascending aorta measuring 4.2cm recommended for annual monitoring. Patient was started on a heparin gtt given CT/LE doppler findings. Started on IV lasix 20mg  daily for management of CHF with UOP net -2.9 L this admission. Weight 245lb>237lbs.  Past Medical History:  Diagnosis Date   Glaucoma    per Dr. Ellie Lunch   Hypertension     Past Surgical History:  Procedure Laterality Date   dislocated left shoulder  1993   right knee giant cell bone tumor  1998   per Dr. Leonides Schanz at [facility]   ruptured lumbar disc  1976   torn meniscus left knee  8/08   Dr. French Ana     Home Medications:  Prior to Admission medications   Medication Sig Start Date End Date Taking? Authorizing Provider  Ascorbic Acid (VITAMIN C) 1000 MG tablet Take 1,000 mg by mouth daily.   Yes [provider]  losartan (COZAAR) 50 MG tablet Take 1 tablet (50 mg total) by mouth daily. 08/13/18  Yes [name], MD  Multiple Vitamin (MULTIVITAMIN) capsule Take 1 capsule by mouth daily.   Yes [provider]  Omega-3 Fatty Acids (FISH OIL) 1200 MG CAPS Take 1,200 mg by  mouth daily.   Yes [provider]  travoprost, benzalkonium, (TRAVATAN) 0.004 % ophthalmic solution Place 1 drop into both eyes daily.     Yes [provider]  Turmeric 1053 MG TABS Take 1,053 mg by mouth daily.   Yes [provider]  doxycycline (VIBRAMYCIN) 100 MG capsule Take 1 capsule (100 mg total) by mouth 2 (two) times daily for 10 days. 01/11/19 01/22/19  [name], MD  hydrochlorothiazide (MICROZIDE) 12.5 MG capsule Take 1 capsule (12.5 mg total) by mouth daily. Patient not taking: Reported on 01/22/2019 08/13/18   [name], MD  losartan-hydrochlorothiazide [facility]) 50-12.5 MG tablet TAKE 1 TABLET BY MOUTH EVERY DAY Patient not taking: Reported on 01/22/2019 01/18/18   [name], MD  VIAGRA 100 MG tablet TAKE 1 TABLET (100 MG TOTAL) BY MOUTH AS NEEDED FOR ERECTILE DYSFUNCTION. Patient not taking: Reported on 01/22/2019 12/23/15   [name], MD    Inpatient Medications: Scheduled Meds:  aspirin EC  81 mg Oral Daily   furosemide  20 mg Intravenous Daily   hydrALAZINE  10 mg Oral Q8H   losartan  50 mg Oral Daily   sodium chloride flush  3 mL Intravenous Q12H   Travoprost (BAK Free)  1 drop Both Eyes QHS   Continuous Infusions:  sodium chloride     heparin 1,850 Units/hr (01/22/19 1005)   PRN Meds: sodium chloride, acetaminophen, albuterol, dextromethorphan-guaiFENesin, hydrALAZINE, morphine injection, nitroGLYCERIN, ondansetron (ZOFRAN) IV, sodium chloride flush  Allergies:    Allergies  Allergen Reactions   Shrimp [Shellfish Allergy] Nausea Only   Erythromycin Rash    Social History:   Social History   Socioeconomic History   Marital status: Married    Spouse name: Not on file   Number of children: Not on file   Years of education: Not on file   Highest education level: Not on file  Occupational History   Not on file  Social Needs   Financial resource strain: Not on file   Food insecurity    Worry: Not on  file    Inability: Not on file   Transportation needs    Medical: Not on file    Non-medical: Not on file  Tobacco Use   Smoking status: Never Smoker   Smokeless tobacco: Never Used  Substance and Sexual Activity   Alcohol use: Yes    Alcohol/week: 0.0 standard drinks    Comment: occ   Drug use: No   Sexual activity: Not on file  Lifestyle   Physical activity    Days per week: Not on file    Minutes per session: Not on file   Stress: Not on file  Relationships   Social connections    Talks on phone: Not on file    Gets together: Not on file    Attends religious service: Not on file    Active member of club or organization: Not on file    Attends meetings of clubs  or organizations: Not on file    Relationship status: Not on file   Intimate partner violence    Fear of current or ex partner: Not on file    Emotionally abused: Not on file    Physically abused: Not on file    Forced sexual activity: Not on file  Other Topics Concern   Not on file  Social History Narrative   Not on file    Family History:    Family History  Problem Relation Age of Onset   Hypertension Other    Stroke Other        first degree male relative    Heart disease Other    Glaucoma Other      ROS:  Please see the history of present illness.   All other ROS reviewed and negative.     Physical Exam/Data:   Vitals:   01/22/19 0811 01/22/19 1053 01/22/19 1234 01/22/19 1239  BP: (!) 177/102 (!) 174/103  (!) 166/105  Pulse: 79 78  82  Resp: 15 19  20   Temp: 98.2 F (36.8 C)  98.2 F (36.8 C) 98.2 F (36.8 C)  TempSrc: Oral  Oral Oral  SpO2: 97% 94%  95%  Weight:      Height:        Intake/Output Summary (Last 24 hours) at 01/22/2019 1241 Last data filed at 01/22/2019 1005 Gross per 24 hour  Intake 638.59 ml  Output 4450 ml  Net -3811.41 ml   Filed Weights   01/21/19 1525 01/21/19 2239 01/22/19 0534  Weight: 113.4 kg 111.1 kg 107.9 kg   Body mass index is  32.26 kg/m.  General:  Well nourished, well developed, sitting upright in bedside chair in no acute distress HEENT: sclera anicteric  Neck: no apparent JVD, though patient is sitting upright in bedside chair Vascular: No carotid bruits; distal pulses 2+ bilaterally Cardiac:  normal S1, S2; RRR; no murmurs, rubs, or gallops Lungs:  Crackles at lung bases, otherwise no wheezing/rhonchi Abd: NABS, soft, nontender, no hepatomegaly Ext: trace RLE edema, 1-2+ LLE ankle edema Musculoskeletal:  No deformities, BUE and BLE strength normal and equal Skin: warm and dry  Neuro:  CNs 2-12 intact, no focal abnormalities noted Psych:  Normal affect   EKG:  The EKG was personally reviewed and demonstrates:  sinus rhythm with rate 78bpm, QTc 461, PVC, non-specific T wave abnormalities, no STE/D Telemetry:  Telemetry was personally reviewed and demonstrates:  NSR with occasional PVCs  Relevant CV Studies:  Echocardiogram 01/22/2019:   1. The left ventricle has mild-moderately reduced systolic function, with an ejection fraction of 40-45%. The cavity size was normal. There is mild concentric left ventricular hypertrophy. Left ventricular diastolic Doppler parameters are consistent  with restrictive filling. Left ventrical global hypokinesis without regional wall motion abnormalities.  2. The right ventricle has mildly reduced systolic function. The cavity was mildly enlarged. There is no increase in right ventricular wall thickness. Right ventricular systolic pressure is mildly elevated with an estimated pressure of 41.0 mmHg.  3. Left atrial size was mildly dilated.  4. Right atrial size was mildly dilated.  5. Mitral valve regurgitation is mild to moderate by color flow Doppler. The MR jet is centrally-directed.  6. The aorta is normal unless otherwise noted.  7. The inferior vena cava was dilated in size with <50% respiratory variability.  Laboratory Data:  Chemistry Recent Labs  Lab 01/21/19 1620  01/22/19 0307  NA 139 137  K 4.0 3.5  CL  103 102  CO2 25 23  GLUCOSE 126* 114*  BUN 20 13  CREATININE 1.10 0.95  CALCIUM 9.1 9.2  GFRNONAA >60 >60  GFRAA >60 >60  ANIONGAP 11 12    Recent Labs  Lab 01/21/19 1620  PROT 7.0  ALBUMIN 4.1  AST 30  ALT 25  ALKPHOS 102  BILITOT 1.7*   Hematology Recent Labs  Lab 01/21/19 1620  WBC 9.0  RBC 4.60  HGB 12.8*  HCT 40.8  MCV 88.7  MCH 27.8  MCHC 31.4  RDW 14.1  PLT 216   Cardiac EnzymesNo results for input(s): TROPONINI in the last 168 hours. No results for input(s): TROPIPOC in the last 168 hours.  BNP Recent Labs  Lab 01/21/19 1620  BNP 341.4*    DDimer No results for input(s): DDIMER in the last 168 hours.  Radiology/Studies:  Dg Chest 2 View  Result Date: 01/21/2019 CLINICAL DATA:  Cough, shortness of breath EXAM: CHEST - 2 VIEW COMPARISON:  None. FINDINGS: Cardiomegaly. Mild diffuse interstitial pulmonary opacity and small bilateral pleural effusions. The visualized skeletal structures are unremarkable. IMPRESSION: Cardiomegaly. Mild diffuse interstitial pulmonary opacity and small bilateral pleural effusions, most consistent with edema. No focal airspace opacity. Electronically Signed   By: Eddie Candle M.D.   On: 01/21/2019 15:47   Ct Angio Chest Pe W And/or Wo Contrast  Result Date: 01/21/2019 CLINICAL DATA:  Concern for PE.  Positive DVT study. EXAM: CT ANGIOGRAPHY CHEST WITH CONTRAST TECHNIQUE: Multidetector CT imaging of the chest was performed using the standard protocol during bolus administration of intravenous contrast. Multiplanar CT image reconstructions and MIPs were obtained to evaluate the vascular anatomy. CONTRAST:  122mL OMNIPAQUE IOHEXOL 350 MG/ML SOLN COMPARISON:  None. FINDINGS: Cardiovascular: Contrast injection is sufficient to demonstrate satisfactory opacification of the pulmonary arteries to the segmental level.Findings are concerning for a small pulmonary embolus involving a segmental branch  of the right lower lobe (axial series 6, image 183). No additional pulmonary emboli identified. The main pulmonary artery is within normal limits for size. There is no CT evidence of acute right heart strain. The ascending aorta is dilated measuring approximately 4.2 cm in diameter. The heart size is enlarged. There is a trace pericardial effusion. There is reflux of contrast into the IVC. Mediastinum/Nodes: --there are enlarged mediastinal and hilar lymph nodes. For example there is a right paratracheal lymph node measuring approximately 1.3 cm. --No axillary lymphadenopathy. --No supraclavicular lymphadenopathy. --Normal thyroid gland. --The esophagus is unremarkable Lungs/Pleura: There are moderate bilateral pleural effusions. There is diffuse interlobular septal thickening. There are areas of atelectasis at the right lung base. There is a rounded opacity involving the right lower lobe measuring approximately 1 cm (axial series 5, image 74). There is no pneumothorax. Upper Abdomen: No acute abnormality. Musculoskeletal: No chest wall abnormality. No acute or significant osseous findings. Review of the MIP images confirms the above findings. IMPRESSION: 1. Very tiny nonocclusive pulmonary embolus involving the right lower lobe as detailed above. 2. Overall findings most concerning for congestive heart failure including moderate-sized bilateral pleural effusions. 3. Mediastinal adenopathy, presumably reactive. 4. There is a 1 cm rounded nodular opacity involving the right lower lobe as detailed above. This may represent a focus of atelectasis, infiltrate, or pulmonary nodule. A three-month follow-up CT of the chest is recommended to confirm resolution of this finding. 5. Dilated ascending aorta measuring 4.2 cm. Recommend annual imaging followup by CTA or MRA. This recommendation follows 2010 ACCF/AHA/AATS/ACR/ASA/SCA/SCAI/SIR/STS/SVM Guidelines for the Diagnosis and Management  of Patients with Thoracic Aortic  Disease. Circulation. 2010; [id number]. Aortic aneurysm NOS (ICD10-I71.9) These results were called by telephone at the time of interpretation on 01/21/2019 at 8:32 pm to the ED attending working with Carlisle Cater , who verbally acknowledged these results. Electronically Signed   By: Constance Holster M.D.   On: 01/21/2019 20:35   US Venous Img Lower  Left (dvt Study)  Result Date: 01/21/2019 CLINICAL DATA:  Left lower extremity pain EXAM: LEFT LOWER EXTREMITY VENOUS DOPPLER ULTRASOUND TECHNIQUE: Gray-scale sonography with graded compression, as well as color Doppler and duplex ultrasound were performed to evaluate the lower extremity deep venous systems from the level of the common femoral vein and including the common femoral, femoral, profunda femoral, popliteal and calf veins including the posterior tibial, peroneal and gastrocnemius veins when visible. The superficial great saphenous vein was also interrogated. Spectral Doppler was utilized to evaluate flow at rest and with distal augmentation maneuvers in the common femoral, femoral and popliteal veins. COMPARISON:  None. FINDINGS: Contralateral Common Femoral Vein: Respiratory phasicity is normal and symmetric with the symptomatic side. No evidence of thrombus. Normal compressibility. Common Femoral Vein: No evidence of thrombus. Normal compressibility, respiratory phasicity and response to augmentation. Saphenofemoral Junction: No evidence of thrombus. Normal compressibility and flow on color Doppler imaging. Profunda Femoral Vein: No evidence of thrombus. Normal compressibility and flow on color Doppler imaging. Femoral Vein: No evidence of thrombus. Normal compressibility, respiratory phasicity and response to augmentation. Popliteal Vein: No evidence of thrombus. Normal compressibility, respiratory phasicity and response to augmentation. Calf Veins: There appears to be nonocclusive thrombus in the posterior tibial vein. The peroneal vein was not  visualized. Superficial Great Saphenous Vein: No evidence of thrombus. Normal compressibility. Venous Reflux:  None. Other Findings:  None. IMPRESSION: Study positive for a nonocclusive DVT in the posterior tibial vein. Electronically Signed   By: Constance Holster M.D.   On: 01/21/2019 19:43    Assessment and Plan:    1. Acute combined CHF: patient presents with DOE, cough, and LE edema for the past 2 months. Recently started on doxycycline without improvement in SOB. CT Chest with bilateral pleural effusions. BNP elevated to 300s. Echo with EF 40-45%, G3DD, diffuse LV hypokinesis without RWMA, mild concentric LV hypertrophy, mildly reduced RV systolic dysfunction, mild biatrial enlargement, and mild-moderate MR. He was started on IV lasix 20mg  daily for management of CHF with UOP net -2.9 L this admission. Weight 245lb>237lbs. Etiology remains unclear though differential includes viral cardiomyopathy, hypertension mediated, or ischemic heart disease.  - Favor continuing low dose IV lasix 20mg  daily given good UOP response.  - Discussed a cardiac MR to evaluate for myocarditis, but patient reports significant claustrophobia.  Will order coronary CTA to rule out ischemic HD as cause of his cardiomyopathy  - Consider transition from losartan to entresto  - Will start carvedilol - Continue to monitor strict I&Os and daily weights - Continue to monitor electrolytes and replete as needed to maintain K >4, Mg >2 - will replete K 60 mEq at this time  2. Elevated troponins: Trop trend [id number]. No complaints of chest pain but patient has been experiencing DOE for the past month. CT Chest with evidence of CHF. BP significantly elevated this admission. No prior ischemic evaluation. Possible this is demand ischemia in the setting of acute CHF and hypertensive urgency. Also possible this represents anginal equivalent. Risk factors for CAD include HTN, HLD, and family history of CAD. Given echo findings of  reduced  EF 40-45% would anticipate additional ischemic evaluation - Coronary CTA  3. Hypertensive urgency: BP significantly elevated on arrival. Improved but remains elevated above goal.  He reports being on losartan alone for the past few months after his HCTZ was not refilled (unclear reason). Possible elevated BP's could be related to frequent Nyquill use. - To be managed in the context of #1 - Will start carvedilol - Continue losartan - could consider transition to entresto prior to discharge.   4. HLD: LDL 92 this admission; has been as high as 134 in the past. He reports taking fish oil daily (not on his home medications) - Will start low dose atorvastatin at this time for risk factor modifications.   5. Thoracic aortic aneurysm: noted to have a 4.2cm dilation of the ascending aorta.  - Continue routine annual monitoring with CTA or MRA  For questions or updates, please contact [place] Please consult [email or web address] for contact info under Cardiology/STEMI.   Signed, Abigail Butts, PA-C  01/22/2019 12:41 PM [phone]   Patient seen and examined. Agree with above documentation.  Mr. Tims is a 63 year old male with a history of hypertension and glaucoma who presents with acute systolic and diastolic heart failure.  Reports that he has been having shortness of breath for the past 2 months.  In addition was having intermittent fevers throughout the month of July.  Was seen by his PCP and started on doxycycline.  States his fevers resolved with doxycycline, but continued to have shortness of breath, prompting him to visit the ED. in the ED he was noted to be persistently hypertensive up to SBP P1 90s.  Normal renal function.  COVID negative.  BNP 341.  HSTn [id number].  EKG personally reviewed, which shows sinus rhythm at rate 78 bpm, PVC, anterior Q waves, TWI in III, aVF.  Telemetry personally reviewed, shows sinus rhythm in 80s, no events.  TTE personally reviewed, shows  mild global hypokinesis (40-45%), mild RV dysfunction, mild-mod MR, RVSP 41.  Found to have small PE on CTPA.  For his combined systolic and diastolic heart failure, etiology is unclear.  Will order coronary CTA to evaluate for ischemic heart disease.  His history of possible viral illness last month raises concern for myocarditis.  Discussed doing a cardiac MRI, however patient has a history of claustrophobia and does not want a proceed with MRI at this time.  Continue IV Lasix, has diuresed well.  Continue losartan, may transition to [facility] prior to discharge.  Will start carvedilol.

## 2019-01-22 NOTE — Care Management (Signed)
Per Early Chars W/ with [facility] co-pay amount for Xarelto 20 mg. daily and Eliquis 5 mg bid will be $30.00 for a 30 day supply,for a 90 day supply will be $75.00 at your retail pharmacy or mail order. For both medications  No PA required No deductible to be met. Out of pocket $3200. No met teir 3 medication Pharmacies in network are:; [facility], [facility]  Ref# [place], [id number]

## 2019-01-22 NOTE — Progress Notes (Addendum)
Patient asking about diet orders, Dr. Rockne Menghini made aware through [facility] system. Will monitor patient Andrew Vaughn, [name] RN

## 2019-01-22 NOTE — Progress Notes (Signed)
[place] for heparin Indication: pulmonary embolus and DVT  Allergies  Allergen Reactions  . Erythromycin Rash    Patient Measurements: Height: 6' (182.9 cm) Weight: 245 lb (111.1 kg) IBW/kg (Calculated) : 77.6 Heparin Dosing Weight: 102kg  Vital Signs: Temp: 98.5 F (36.9 C) (09/01 0205) Temp Source: Oral (09/01 0205) BP: 171/105 (09/01 0205) Pulse Rate: 88 (09/01 0205)  Labs: Recent Labs    01/21/19 1620 01/21/19 1815 01/21/19 2317 01/22/19 0103 01/22/19 0307  HGB 12.8*  --   --   --   --   HCT 40.8  --   --   --   --   PLT 216  --   --   --   --   HEPARINUNFRC  --   --   --   --  0.29*  CREATININE 1.10  --   --   --   --   TROPONINIHS 36* 39* 43* 43*  --     Estimated Creatinine Clearance: 90.8 mL/min (by C-G formula based on SCr of 1.1 mg/dL).  Assessment: 63 y.o. male with PE/DVT fro heparin  Goal of Therapy:  Heparin level 0.3-0.7 units/ml Monitor platelets by anticoagulation protocol: Yes   Plan:  Increase Heparin 1850 units/hr Check heparin level in 6 hours.   Phillis Knack, PharmD, BCPS  01/22/2019 4:29 AM

## 2019-01-22 NOTE — Progress Notes (Signed)
Patient received via [facility]. Alert and oriented. Oriented to room . R.N. into do assessment. No complaints voiced

## 2019-01-22 NOTE — Progress Notes (Signed)
[place] for heparin Indication: pulmonary embolus and DVT  Allergies  Allergen Reactions  . Shrimp [Shellfish Allergy] Nausea Only  . Erythromycin Rash    Patient Measurements: Height: 6' (182.9 cm) Weight: 237 lb 14 oz (107.9 kg) IBW/kg (Calculated) : 77.6 Heparin Dosing Weight: 102kg  Vital Signs: Temp: 98.2 F (36.8 C) (09/01 1239) Temp Source: Oral (09/01 1239) BP: 166/105 (09/01 1239) Pulse Rate: 82 (09/01 1239)  Labs: Recent Labs    01/21/19 1620  01/21/19 2317 01/22/19 0103 01/22/19 0307 01/22/19 1131  HGB 12.8*  --   --   --   --   --   HCT 40.8  --   --   --   --   --   PLT 216  --   --   --   --   --   HEPARINUNFRC  --   --   --   --  0.29* 0.49  CREATININE 1.10  --   --   --  0.95  --   TROPONINIHS 36*   < > 43* 43* 53*  --    < > = values in this interval not displayed.    Estimated Creatinine Clearance: 103.6 mL/min (by C-G formula based on SCr of 0.95 mg/dL).  Assessment: 63 y.o. male with PE/DVT continues on heparin Heparin level now therapeutic x 2 CBC stable  Goal of Therapy:  Heparin level 0.3-0.7 units/ml Monitor platelets by anticoagulation protocol: Yes   Plan:  Continue  Heparin at 1850 units/hr Daily heparin level and CBC  Thank you Anette Guarneri, PharmD  01/22/2019 12:51 PM

## 2019-01-23 ENCOUNTER — Inpatient Hospital Stay (HOSPITAL_COMMUNITY): Payer: BC Managed Care – PPO

## 2019-01-23 ENCOUNTER — Ambulatory Visit: Payer: BC Managed Care – PPO | Admitting: Family Medicine

## 2019-01-23 DIAGNOSIS — I10 Essential (primary) hypertension: Secondary | ICD-10-CM

## 2019-01-23 DIAGNOSIS — I2510 Atherosclerotic heart disease of native coronary artery without angina pectoris: Secondary | ICD-10-CM

## 2019-01-23 DIAGNOSIS — I2699 Other pulmonary embolism without acute cor pulmonale: Principal | ICD-10-CM

## 2019-01-23 LAB — BASIC METABOLIC PANEL
Anion gap: 11 (ref 5–15)
BUN: 17 mg/dL (ref 8–23)
CO2: 24 mmol/L (ref 22–32)
Calcium: 9.1 mg/dL (ref 8.9–10.3)
Chloride: 101 mmol/L (ref 98–111)
Creatinine, Ser: 1.13 mg/dL (ref 0.61–1.24)
GFR calc Af Amer: >60 mL/min (ref >60)
GFR calc non Af Amer: >60 mL/min (ref >60)
Glucose, Bld: 97 mg/dL (ref 70–99)
Potassium: 4.1 mmol/L (ref 3.5–5.1)
Sodium: 136 mmol/L (ref 135–145)

## 2019-01-23 LAB — HEPARIN LEVEL (UNFRACTIONATED): Heparin Unfractionated: 0.79 IU/mL — ABNORMAL HIGH (ref 0.30–0.70)

## 2019-01-23 MED ORDER — METOPROLOL TARTRATE 50 MG PO TABS
50.0000 mg | ORAL_TABLET | Freq: Once | ORAL | Status: AC | PRN
  Administered 2019-01-23: 50 mg via ORAL

## 2019-01-23 MED ORDER — ATORVASTATIN CALCIUM 40 MG PO TABS: 40.0000 mg | ORAL_TABLET | Freq: Every day | ORAL | Status: DC

## 2019-01-23 MED ORDER — NITROGLYCERIN 0.4 MG SL SUBL
0.8000 mg | SUBLINGUAL_TABLET | Freq: Once | SUBLINGUAL | Status: AC
  Administered 2019-01-23: 0.8 mg via SUBLINGUAL

## 2019-01-23 MED ORDER — NITROGLYCERIN 0.4 MG SL SUBL
SUBLINGUAL_TABLET | SUBLINGUAL | Status: AC
  Filled 2019-01-23: qty 2

## 2019-01-23 MED ORDER — SACUBITRIL-VALSARTAN 24-26 MG PO TABS
1.0000 | ORAL_TABLET | Freq: Two times a day (BID) | ORAL | Status: DC
  Administered 2019-01-24: 1 via ORAL
  Filled 2019-01-23 (×2): qty 1

## 2019-01-23 MED ORDER — IOHEXOL 350 MG/ML SOLN
100.0000 mL | Freq: Once | INTRAVENOUS | Status: AC | PRN
  Administered 2019-01-23: 100 mL via INTRAVENOUS

## 2019-01-23 NOTE — Progress Notes (Signed)
PROGRESS NOTE    [name]  [id number] DOB: 05-03-1956 DOA: 01/21/2019 PCP: [name], MD   Brief Narrative:  HPI on 01/21/2019 by Dr. Ivor Costa Andrew Vaughn is a 63 y.o. male with medical history significant of hypertension, glaucoma, erectiledysfunction,whopresents with shortness of breath.  Pt states that he has been having shortness of breath for more than 1 month.  He has dry cough and intermittent fever recently, which has resolved. No chest pain. Has also has bilateral leg edema (theleft legisworse thantheright). Pt was seen by PCP and had negative COVID-19 test on 01/07/2019. Patient was treated empirically with 10-day course of doxycycline by PCP. He just finished taking doxycycline yesterday, but no significant improvement of his SOB.  Patient denies any nausea, vomiting, diarrhea, abdominal pain, symptoms of UTI or unilateral weakness.  Interim history Patient admitted with lower extremity DVT as well as PE in the right lower lobe.  Findings concerning for congestive heart failure with bilateral pleural effusions on CT scan.  Cardiology consulted and appreciated, pending coronary CTA today.  Assessment & Plan   Acute right lung pulmonary embolism/acute left lower extremity DVT -Patient states he has been somewhat sedentary in the last few months and working long hours as a Engineer, technical sales -CTA chest: Nonocclusive pulmonary embolism involving the right lower lobe -Lower extremity DVT showed nonocclusive DVT Left posterior tibial vein -Currently on IV heparin -Echocardiogram EF of 40 to 45%.  Mild concentric LV hypertrophy.  LV diastolic Doppler parameters consistent with restrictive filling.  LV global hypokinesis without regional wall motion abnormalities. -Patient would like to move forward with Eliquis when possible -Case management consulted  Bilateral pleural effusions with acute CHF -BNP 341, patient with bilateral lower extremity edema and  pulmonary edema on chest x-ray consistent with CHF -Echocardiogram as above -Troponins mildly elevated likely due to demand ischemia secondary to PE -Continue aspirin -Monitor intake and output, daily weights -Continue IV Lasix -Continue losartan, HCTZ currently held -Cardiology consulted and appreciated  Hypertensive urgency -BP 179/116 at the time of admission -Continue losartan, hydralazine, IV Lasix  Nodular opacity right lower lobe -CTA showed 1 cm rounded nodular opacity involving right lower lobe.  May represent a focus of atelectasis, infiltrate or pulmonary nodule -Will need repeat CT scan in 3 months  Dilated ascending aorta -CTA showed dilated ascending aorta measuring 4.2 cm -Patient will need to follow-up with PCP as an outpatient  DVT Prophylaxis Heparin  Code Status: Full  Family Communication: None at bedside  Disposition Plan: Admitted.  Pending further recommendations from cardiology.  Consultants Cardiology  Procedures  Echocardiogram Lower extremity Doppler  Antibiotics   Anti-infectives (From admission, onward)   None      Subjective:   Andrew Vaughn seen and examined today.  Patient feeling mildly better.  Has some shortness of breath.  Would like to get up and walk some to see how much he can do today.  Denies current chest pain, abdominal pain, nausea or vomiting, diarrhea or constipation, dizziness or headache.  Objective:   Vitals:   01/22/19 2037 01/23/19 0539 01/23/19 0820 01/23/19 0823  BP: 135/83 (!) 147/99  (!) 160/91  Pulse: 69 67 70   Resp: 17 19  14   Temp: 98.4 F (36.9 C) 98.2 F (36.8 C)    TempSrc: Oral Oral    SpO2: 93% 98%    Weight:  106.2 kg    Height:        Intake/Output Summary (Last 24 hours) at  01/23/2019 1020 Last data filed at 01/23/2019 1016 Gross per 24 hour  Intake 480 ml  Output 1325 ml  Net -845 ml   Filed Weights   01/21/19 2239 01/22/19 0534 01/23/19 0539  Weight: 111.1 kg 107.9 kg 106.2 kg     Exam  General: Well developed, well nourished, NAD, appears stated age  30: NCAT,  mucous membranes moist.   Neck: Supple  Cardiovascular: S1 S2 auscultated, RRR, no murmur  Respiratory: Clear to auscultation bilaterally, no wheezing  Abdomen: Soft, nontender, nondistended, + bowel sounds  Extremities: warm dry without cyanosis clubbing. LLE edema > RLE  Neuro: AAOx3, nonfocal  Psych: Pleasant, appropriate mood and affect   Data Reviewed: I have personally reviewed following labs and imaging studies  CBC: Recent Labs  Lab 01/21/19 1620  WBC 9.0  NEUTROABS 7.4  HGB 12.8*  HCT 40.8  MCV 88.7  PLT [id number]   Basic Metabolic Panel: Recent Labs  Lab 01/21/19 1620 01/22/19 0307 01/23/19 0332  NA 139 137 136  K 4.0 3.5 4.1  CL 103 102 101  CO2 25 23 24   GLUCOSE 126* 114* 97  BUN 20 13 17   CREATININE 1.10 0.95 1.13  CALCIUM 9.1 9.2 9.1   GFR: Estimated Creatinine Clearance: 86.4 mL/min (by C-G formula based on SCr of 1.13 mg/dL). Liver Function Tests: Recent Labs  Lab 01/21/19 1620  AST 30  ALT 25  ALKPHOS 102  BILITOT 1.7*  PROT 7.0  ALBUMIN 4.1   No results for input(s): LIPASE, AMYLASE in the last 168 hours. No results for input(s): AMMONIA in the last 168 hours. Coagulation Profile: No results for input(s): INR, PROTIME in the last 168 hours. Cardiac Enzymes: No results for input(s): CKTOTAL, CKMB, CKMBINDEX, TROPONINI in the last 168 hours. BNP (last 3 results) No results for input(s): PROBNP in the last 8760 hours. HbA1C: Recent Labs    01/22/19 0307  HGBA1C 5.5   CBG: No results for input(s): GLUCAP in the last 168 hours. Lipid Profile: Recent Labs    01/22/19 0307  CHOL 130  HDL 29*  LDLCALC 92  TRIG 43  CHOLHDL 4.5   Thyroid Function Tests: No results for input(s): TSH, T4TOTAL, FREET4, T3FREE, THYROIDAB in the last 72 hours. Anemia Panel: No results for input(s): VITAMINB12, FOLATE, FERRITIN, TIBC, IRON, RETICCTPCT in  the last 72 hours. Urine analysis:    Component Value Date/Time   COLORURINE yellow 07/14/2008 1012   APPEARANCEUR Clear 07/14/2008 1012   LABSPEC 1.015 07/14/2008 1012   PHURINE 7.0 07/14/2008 1012   HGBUR negative 07/14/2008 1012   BILIRUBINUR neg 03/17/2016 1044   PROTEINUR trace 03/17/2016 1044   UROBILINOGEN 0.2 03/17/2016 1044   UROBILINOGEN 0.2 07/14/2008 1012   NITRITE neg 03/17/2016 1044   NITRITE negative 07/14/2008 1012   LEUKOCYTESUR Negative 03/17/2016 1044   Sepsis Labs: @LABRCNTIP (procalcitonin:4,lacticidven:4)  ) Recent Results (from the past 240 hour(s))  SARS Coronavirus 2 [facility] order, Performed in [facility] hospital lab) Nasopharyngeal Nasopharyngeal Swab     Status: None   Collection Time: 01/21/19  6:50 PM   Specimen: Nasopharyngeal Swab  Result Value Ref Range Status   SARS Coronavirus 2 NEGATIVE NEGATIVE Final    Comment: (NOTE) If result is NEGATIVE SARS-CoV-2 target nucleic acids are NOT DETECTED. The SARS-CoV-2 RNA is generally detectable in upper and lower  respiratory specimens during the acute phase of infection. The lowest  concentration of SARS-CoV-2 viral copies this assay can detect is 250  copies / mL. A  negative result does not preclude SARS-CoV-2 infection  and should not be used as the sole basis for treatment or other  patient management decisions.  A negative result may occur with  improper specimen collection / handling, submission of specimen other  than nasopharyngeal swab, presence of viral mutation(s) within the  areas targeted by this assay, and inadequate number of viral copies  (<250 copies / mL). A negative result must be combined with clinical  observations, patient history, and epidemiological information. If result is POSITIVE SARS-CoV-2 target nucleic acids are DETECTED. The SARS-CoV-2 RNA is generally detectable in upper and lower  respiratory specimens dur ing the acute phase of infection.  Positive  results are  indicative of active infection with SARS-CoV-2.  Clinical  correlation with patient history and other diagnostic information is  necessary to determine patient infection status.  Positive results do  not rule out bacterial infection or co-infection with other viruses. If result is PRESUMPTIVE POSTIVE SARS-CoV-2 nucleic acids MAY BE PRESENT.   A presumptive positive result was obtained on the submitted specimen  and confirmed on repeat testing.  While 2019 novel coronavirus  (SARS-CoV-2) nucleic acids may be present in the submitted sample  additional confirmatory testing may be necessary for epidemiological  and / or clinical management purposes  to differentiate between  SARS-CoV-2 and other Sarbecovirus currently known to infect humans.  If clinically indicated additional testing with an alternate test  methodology [phone]) is advised. The SARS-CoV-2 RNA is generally  detectable in upper and lower respiratory sp ecimens during the acute  phase of infection. The expected result is Negative. Fact Sheet for Patients:  StrictlyIdeas.no Fact Sheet for Healthcare Providers: BankingDealers.co.za This test is not yet approved or cleared by the [place] FDA and has been authorized for detection and/or diagnosis of SARS-CoV-2 by FDA under an Emergency Use Authorization (EUA).  This EUA will remain in effect (meaning this test can be used) for the duration of the COVID-19 declaration under Section 564(b)(1) of the Act, 21 U.S.C. section 360bbb-3(b)(1), unless the authorization is terminated or revoked sooner. Performed at [facility], [address], [place], [place] [address]       Radiology Studies: Dg Chest 2 View  Result Date: 01/21/2019 CLINICAL DATA:  Cough, shortness of breath EXAM: CHEST - 2 VIEW COMPARISON:  None. FINDINGS: Cardiomegaly. Mild diffuse interstitial pulmonary opacity and small bilateral pleural  effusions. The visualized skeletal structures are unremarkable. IMPRESSION: Cardiomegaly. Mild diffuse interstitial pulmonary opacity and small bilateral pleural effusions, most consistent with edema. No focal airspace opacity. Electronically Signed   By: Eddie Candle M.D.   On: 01/21/2019 15:47   Ct Angio Chest Pe W And/or Wo Contrast  Result Date: 01/21/2019 CLINICAL DATA:  Concern for PE.  Positive DVT study. EXAM: CT ANGIOGRAPHY CHEST WITH CONTRAST TECHNIQUE: Multidetector CT imaging of the chest was performed using the standard protocol during bolus administration of intravenous contrast. Multiplanar CT image reconstructions and MIPs were obtained to evaluate the vascular anatomy. CONTRAST:  133mL OMNIPAQUE IOHEXOL 350 MG/ML SOLN COMPARISON:  None. FINDINGS: Cardiovascular: Contrast injection is sufficient to demonstrate satisfactory opacification of the pulmonary arteries to the segmental level.Findings are concerning for a small pulmonary embolus involving a segmental branch of the right lower lobe (axial series 6, image 183). No additional pulmonary emboli identified. The main pulmonary artery is within normal limits for size. There is no CT evidence of acute right heart strain. The ascending aorta is dilated measuring approximately 4.2  cm in diameter. The heart size is enlarged. There is a trace pericardial effusion. There is reflux of contrast into the IVC. Mediastinum/Nodes: --there are enlarged mediastinal and hilar lymph nodes. For example there is a right paratracheal lymph node measuring approximately 1.3 cm. --No axillary lymphadenopathy. --No supraclavicular lymphadenopathy. --Normal thyroid gland. --The esophagus is unremarkable Lungs/Pleura: There are moderate bilateral pleural effusions. There is diffuse interlobular septal thickening. There are areas of atelectasis at the right lung base. There is a rounded opacity involving the right lower lobe measuring approximately 1 cm (axial series 5,  image 74). There is no pneumothorax. Upper Abdomen: No acute abnormality. Musculoskeletal: No chest wall abnormality. No acute or significant osseous findings. Review of the MIP images confirms the above findings. IMPRESSION: 1. Very tiny nonocclusive pulmonary embolus involving the right lower lobe as detailed above. 2. Overall findings most concerning for congestive heart failure including moderate-sized bilateral pleural effusions. 3. Mediastinal adenopathy, presumably reactive. 4. There is a 1 cm rounded nodular opacity involving the right lower lobe as detailed above. This may represent a focus of atelectasis, infiltrate, or pulmonary nodule. A three-month follow-up CT of the chest is recommended to confirm resolution of this finding. 5. Dilated ascending aorta measuring 4.2 cm. Recommend annual imaging followup by CTA or MRA. This recommendation follows 2010 ACCF/AHA/AATS/ACR/ASA/SCA/SCAI/SIR/STS/SVM Guidelines for the Diagnosis and Management of Patients with Thoracic Aortic Disease. Circulation. 2010; [id number]. Aortic aneurysm NOS (ICD10-I71.9) These results were called by telephone at the time of interpretation on 01/21/2019 at 8:32 pm to the ED attending working with Carlisle Cater , who verbally acknowledged these results. Electronically Signed   By: Constance Holster M.D.   On: 01/21/2019 20:35   US Venous Img Lower  Left (dvt Study)  Result Date: 01/21/2019 CLINICAL DATA:  Left lower extremity pain EXAM: LEFT LOWER EXTREMITY VENOUS DOPPLER ULTRASOUND TECHNIQUE: Gray-scale sonography with graded compression, as well as color Doppler and duplex ultrasound were performed to evaluate the lower extremity deep venous systems from the level of the common femoral vein and including the common femoral, femoral, profunda femoral, popliteal and calf veins including the posterior tibial, peroneal and gastrocnemius veins when visible. The superficial great saphenous vein was also interrogated. Spectral  Doppler was utilized to evaluate flow at rest and with distal augmentation maneuvers in the common femoral, femoral and popliteal veins. COMPARISON:  None. FINDINGS: Contralateral Common Femoral Vein: Respiratory phasicity is normal and symmetric with the symptomatic side. No evidence of thrombus. Normal compressibility. Common Femoral Vein: No evidence of thrombus. Normal compressibility, respiratory phasicity and response to augmentation. Saphenofemoral Junction: No evidence of thrombus. Normal compressibility and flow on color Doppler imaging. Profunda Femoral Vein: No evidence of thrombus. Normal compressibility and flow on color Doppler imaging. Femoral Vein: No evidence of thrombus. Normal compressibility, respiratory phasicity and response to augmentation. Popliteal Vein: No evidence of thrombus. Normal compressibility, respiratory phasicity and response to augmentation. Calf Veins: There appears to be nonocclusive thrombus in the posterior tibial vein. The peroneal vein was not visualized. Superficial Great Saphenous Vein: No evidence of thrombus. Normal compressibility. Venous Reflux:  None. Other Findings:  None. IMPRESSION: Study positive for a nonocclusive DVT in the posterior tibial vein. Electronically Signed   By: Constance Holster M.D.   On: 01/21/2019 19:43     Scheduled Meds:  aspirin EC  81 mg Oral Daily   atorvastatin  20 mg Oral q1800   carvedilol  12.5 mg Oral BID WC   furosemide  20 mg Intravenous  Daily   hydrALAZINE  10 mg Oral Q8H   losartan  50 mg Oral Daily   sodium chloride flush  10-40 mL Intracatheter Q12H   sodium chloride flush  3 mL Intravenous Q12H   Travoprost (BAK Free)  1 drop Both Eyes QHS   Continuous Infusions:  sodium chloride     heparin 1,750 Units/hr (01/23/19 0507)     LOS: 2 days   Time Spent in minutes   45 minutes  Gyselle Matthew D.O. on 01/23/2019 at 10:20 AM  Between 7am to 7pm - Please see pager noted on [email or web address]  After 7pm go  to [email or web address]  And look for the night coverage person covering for me after hours  [facility] Office  [phone]

## 2019-01-23 NOTE — Progress Notes (Signed)
Progress Note  Patient Name: Andrew Vaughn Date of Encounter: 01/23/2019  Primary Cardiologist: No primary care provider on file. New- Nasser Ku  Subjective   Net negative 1.3L yesterday (4.2L on admission) on IV lasix 20 mg, with stable Cr (1.1 ->0.95->1.1).  Weight down to 234 lbs from 250lbs on admission.  Reports dyspnea improved.    Inpatient Medications    Scheduled Meds: . aspirin EC  81 mg Oral Daily  . atorvastatin  20 mg Oral q1800  . carvedilol  12.5 mg Oral BID WC  . furosemide  20 mg Intravenous Daily  . hydrALAZINE  10 mg Oral Q8H  . losartan  50 mg Oral Daily  . sodium chloride flush  10-40 mL Intracatheter Q12H  . sodium chloride flush  3 mL Intravenous Q12H  . Travoprost (BAK Free)  1 drop Both Eyes QHS   Continuous Infusions: . sodium chloride    . heparin 1,750 Units/hr (01/23/19 0507)   PRN Meds: sodium chloride, acetaminophen, albuterol, dextromethorphan-guaiFENesin, hydrALAZINE, metoprolol tartrate, morphine injection, nitroGLYCERIN, ondansetron (ZOFRAN) IV, sodium chloride flush, sodium chloride flush   Vital Signs    Vitals:   01/22/19 2037 01/23/19 0539 01/23/19 0820 01/23/19 0823  BP: 135/83 (!) 147/99  (!) 160/91  Pulse: 69 67 70   Resp: 17 19  14   Temp: 98.4 F (36.9 C) 98.2 F (36.8 C)    TempSrc: Oral Oral    SpO2: 93% 98%    Weight:  106.2 kg    Height:        Intake/Output Summary (Last 24 hours) at 01/23/2019 1045 Last data filed at 01/23/2019 1016 Gross per 24 hour  Intake 480 ml  Output 1325 ml  Net -845 ml   Last 3 Weights 01/23/2019 01/22/2019 01/21/2019  Weight (lbs) 234 lb 1.6 oz 237 lb 14 oz 245 lb  Weight (kg) 106.187 kg 107.9 kg 111.131 kg      Telemetry    Sinus rhythm with rates 60-70s, rare PVCs- Personally Reviewed  ECG    No new ECG  Physical Exam   GEN: No acute distress.   Neck: No JVD Cardiac: RRR, no murmurs, rubs, or gallops.  Respiratory: Clear to auscultation bilaterally. GI: Soft, nontender,  non-distended  MS: 1+ BLE edema; No deformity. Neuro:  Nonfocal  Psych: Normal affect   Labs    High Sensitivity Troponin:   Recent Labs  Lab 01/21/19 1620 01/21/19 1815 01/21/19 2317 01/22/19 0103 01/22/19 0307  TROPONINIHS 36* 39* 43* 43* 53*      Chemistry Recent Labs  Lab 01/21/19 1620 01/22/19 0307 01/23/19 0332  NA 139 137 136  K 4.0 3.5 4.1  CL 103 102 101  CO2 25 23 24   GLUCOSE 126* 114* 97  BUN 20 13 17   CREATININE 1.10 0.95 1.13  CALCIUM 9.1 9.2 9.1  PROT 7.0  --   --   ALBUMIN 4.1  --   --   AST 30  --   --   ALT 25  --   --   ALKPHOS 102  --   --   BILITOT 1.7*  --   --   GFRNONAA >60 >60 >60  GFRAA >60 >60 >60  ANIONGAP 11 12 11      Hematology Recent Labs  Lab 01/21/19 1620  WBC 9.0  RBC 4.60  HGB 12.8*  HCT 40.8  MCV 88.7  MCH 27.8  MCHC 31.4  RDW 14.1  PLT 216    BNP Recent Labs  Lab 01/21/19 1620  BNP 341.4*     DDimer No results for input(s): DDIMER in the last 168 hours.   Radiology    Dg Chest 2 View  Result Date: 01/21/2019 CLINICAL DATA:  Cough, shortness of breath EXAM: CHEST - 2 VIEW COMPARISON:  None. FINDINGS: Cardiomegaly. Mild diffuse interstitial pulmonary opacity and small bilateral pleural effusions. The visualized skeletal structures are unremarkable. IMPRESSION: Cardiomegaly. Mild diffuse interstitial pulmonary opacity and small bilateral pleural effusions, most consistent with edema. No focal airspace opacity. Electronically Signed   By: Eddie Candle M.D.   On: 01/21/2019 15:47   Ct Angio Chest Pe W And/or Wo Contrast  Result Date: 01/21/2019 CLINICAL DATA:  Concern for PE.  Positive DVT study. EXAM: CT ANGIOGRAPHY CHEST WITH CONTRAST TECHNIQUE: Multidetector CT imaging of the chest was performed using the standard protocol during bolus administration of intravenous contrast. Multiplanar CT image reconstructions and MIPs were obtained to evaluate the vascular anatomy. CONTRAST:  125mL OMNIPAQUE IOHEXOL 350  MG/ML SOLN COMPARISON:  None. FINDINGS: Cardiovascular: Contrast injection is sufficient to demonstrate satisfactory opacification of the pulmonary arteries to the segmental level.Findings are concerning for a small pulmonary embolus involving a segmental branch of the right lower lobe (axial series 6, image 183). No additional pulmonary emboli identified. The main pulmonary artery is within normal limits for size. There is no CT evidence of acute right heart strain. The ascending aorta is dilated measuring approximately 4.2 cm in diameter. The heart size is enlarged. There is a trace pericardial effusion. There is reflux of contrast into the IVC. Mediastinum/Nodes: --there are enlarged mediastinal and hilar lymph nodes. For example there is a right paratracheal lymph node measuring approximately 1.3 cm. --No axillary lymphadenopathy. --No supraclavicular lymphadenopathy. --Normal thyroid gland. --The esophagus is unremarkable Lungs/Pleura: There are moderate bilateral pleural effusions. There is diffuse interlobular septal thickening. There are areas of atelectasis at the right lung base. There is a rounded opacity involving the right lower lobe measuring approximately 1 cm (axial series 5, image 74). There is no pneumothorax. Upper Abdomen: No acute abnormality. Musculoskeletal: No chest wall abnormality. No acute or significant osseous findings. Review of the MIP images confirms the above findings. IMPRESSION: 1. Very tiny nonocclusive pulmonary embolus involving the right lower lobe as detailed above. 2. Overall findings most concerning for congestive heart failure including moderate-sized bilateral pleural effusions. 3. Mediastinal adenopathy, presumably reactive. 4. There is a 1 cm rounded nodular opacity involving the right lower lobe as detailed above. This may represent a focus of atelectasis, infiltrate, or pulmonary nodule. A three-month follow-up CT of the chest is recommended to confirm resolution of  this finding. 5. Dilated ascending aorta measuring 4.2 cm. Recommend annual imaging followup by CTA or MRA. This recommendation follows 2010 ACCF/AHA/AATS/ACR/ASA/SCA/SCAI/SIR/STS/SVM Guidelines for the Diagnosis and Management of Patients with Thoracic Aortic Disease. Circulation. 2010; [id number]. Aortic aneurysm NOS (ICD10-I71.9) These results were called by telephone at the time of interpretation on 01/21/2019 at 8:32 pm to the ED attending working with Carlisle Cater , who verbally acknowledged these results. Electronically Signed   By: Constance Holster M.D.   On: 01/21/2019 20:35   US Venous Img Lower  Left (dvt Study)  Result Date: 01/21/2019 CLINICAL DATA:  Left lower extremity pain EXAM: LEFT LOWER EXTREMITY VENOUS DOPPLER ULTRASOUND TECHNIQUE: Gray-scale sonography with graded compression, as well as color Doppler and duplex ultrasound were performed to evaluate the lower extremity deep venous systems from the level of the common femoral vein and including  the common femoral, femoral, profunda femoral, popliteal and calf veins including the posterior tibial, peroneal and gastrocnemius veins when visible. The superficial great saphenous vein was also interrogated. Spectral Doppler was utilized to evaluate flow at rest and with distal augmentation maneuvers in the common femoral, femoral and popliteal veins. COMPARISON:  None. FINDINGS: Contralateral Common Femoral Vein: Respiratory phasicity is normal and symmetric with the symptomatic side. No evidence of thrombus. Normal compressibility. Common Femoral Vein: No evidence of thrombus. Normal compressibility, respiratory phasicity and response to augmentation. Saphenofemoral Junction: No evidence of thrombus. Normal compressibility and flow on color Doppler imaging. Profunda Femoral Vein: No evidence of thrombus. Normal compressibility and flow on color Doppler imaging. Femoral Vein: No evidence of thrombus. Normal compressibility, respiratory  phasicity and response to augmentation. Popliteal Vein: No evidence of thrombus. Normal compressibility, respiratory phasicity and response to augmentation. Calf Veins: There appears to be nonocclusive thrombus in the posterior tibial vein. The peroneal vein was not visualized. Superficial Great Saphenous Vein: No evidence of thrombus. Normal compressibility. Venous Reflux:  None. Other Findings:  None. IMPRESSION: Study positive for a nonocclusive DVT in the posterior tibial vein. Electronically Signed   By: Constance Holster M.D.   On: 01/21/2019 19:43    Cardiac Studies   Echocardiogram 01/22/2019:  1. The left ventricle has mild-moderately reduced systolic function, with an ejection fraction of 40-45%. The cavity size was normal. There is mild concentric left ventricular hypertrophy. Left ventricular diastolic Doppler parameters are consistent  with restrictive filling. Left ventrical global hypokinesis without regional wall motion abnormalities. 2. The right ventricle has mildly reduced systolic function. The cavity was mildly enlarged. There is no increase in right ventricular wall thickness. Right ventricular systolic pressure is mildly elevated with an estimated pressure of 41.0 mmHg. 3. Left atrial size was mildly dilated. 4. Right atrial size was mildly dilated. 5. Mitral valve regurgitation is mild to moderate by color flow Doppler. The MR jet is centrally-directed. 6. The aorta is normal unless otherwise noted. 7. The inferior vena cava was dilated in size with <50% respiratory variability. Patient Profile     63 y.o. male with a PMH of HTN and glaucoma who is being seen today for the evaluation of CHF and elevated troponin   Assessment & Plan    1. Acute combined CHF: patient presents with DOE, cough, and LE edema for the past 2 months. Recently started on doxycycline without improvement in SOB. CT Chest with bilateral pleural effusions. BNP elevated to 300s. Echo with EF  40-45%, G3DD, diffuse LV hypokinesis without RWMA, mild concentric LV hypertrophy, mildly reduced RV systolic dysfunction, mild biatrial enlargement, and mild-moderate MR. He was started on IV lasix 20mg  daily. Etiology remains unclear though differential includes viral cardiomyopathy, hypertension mediated, or ischemic heart disease.  - Continue IV lasix 20mg  daily for now - Discussed a cardiac MR to evaluate for myocarditis, but patient reports significant claustrophobia.  Coronary CTA today rule out ischemic HD as cause of his cardiomyopathy  - May transition from losartan to entresto prior to discharge - Started carvedilol 12.5 mg daily - Continue to monitor strict I&Os and daily weights - Continue to monitor electrolytes and replete as needed to maintain K >4, Mg >2   2. Elevated troponins: Trop trend [phone]. No complaints of chest pain but patient has been experiencing DOE for the past month. CT Chest with evidence of CHF. BP significantly elevated this admission. No prior ischemic evaluation. Possible this is demand ischemia in the setting of  acute CHF and hypertensive urgency. Also possible this represents anginal equivalent. Risk factors for CAD include HTN, HLD, and family history of CAD. Given echo findings of reduced EF 40-45% would anticipate additional ischemic evaluation - Coronary CTA today  3. Hypertensive urgency: BP significantly elevated on arrival. Improved but remains elevated above goal.  He reports being on losartan alone for the past few months after his HCTZ was not refilled (unclear reason). Possible elevated BP's could be related to frequent Nyquill use. - To be managed in the context of #1 - Started carvedilol - Continue losartan - will consider transition to entresto prior to discharge.   4. HLD: LDL 92 this admission; has been as high as 134 in the past. He reports taking fish oil daily (not on his home medications) - Started atorvastatin  5. Thoracic  aortic aneurysm: noted to have a 4.2cm dilation of the ascending aorta.  - Continue routine annual monitoring with CTA or MRA     For questions or updates, please contact [place] Please consult [email or web address] for contact info under     Signed, Donato Heinz, MD  01/23/2019, 10:45 AM

## 2019-01-23 NOTE — Progress Notes (Signed)
[place] for heparin Indication: pulmonary embolus and DVT  Allergies  Allergen Reactions  . Shrimp [Shellfish Allergy] Nausea Only  . Erythromycin Rash    Patient Measurements: Height: 6' (182.9 cm) Weight: 237 lb 14 oz (107.9 kg) IBW/kg (Calculated) : 77.6 Heparin Dosing Weight: 102kg  Vital Signs: Temp: 98.4 F (36.9 C) (09/01 2037) Temp Source: Oral (09/01 2037) BP: 135/83 (09/01 2037) Pulse Rate: 69 (09/01 2037)  Labs: Recent Labs    01/21/19 1620  01/21/19 2317 01/22/19 0103 01/22/19 0307 01/22/19 1131 01/23/19 0332  HGB 12.8*  --   --   --   --   --   --   HCT 40.8  --   --   --   --   --   --   PLT 216  --   --   --   --   --   --   HEPARINUNFRC  --   --   --   --  0.29* 0.49 0.79*  CREATININE 1.10  --   --   --  0.95  --  1.13  TROPONINIHS 36*   < > 43* 43* 53*  --   --    < > = values in this interval not displayed.    Estimated Creatinine Clearance: 87.1 mL/min (by C-G formula based on SCr of 1.13 mg/dL).  Assessment: 63 y.o. male with PE/DVT fro heparin  Goal of Therapy:  Heparin level 0.3-0.7 units/ml Monitor platelets by anticoagulation protocol: Yes   Plan:  Decrease Heparin 1750 units/hr  Phillis Knack, PharmD, BCPS  01/23/2019 4:59 AM

## 2019-01-23 NOTE — Progress Notes (Signed)
O2 sats while sleeping drops down in the low 80's with good wave form. Immed. Comes back up in the mid to upper 90's when awaken.

## 2019-01-24 DIAGNOSIS — I2510 Atherosclerotic heart disease of native coronary artery without angina pectoris: Secondary | ICD-10-CM

## 2019-01-24 LAB — BASIC METABOLIC PANEL
Anion gap: 12 (ref 5–15)
BUN: 27 mg/dL — ABNORMAL HIGH (ref 8–23)
CO2: 24 mmol/L (ref 22–32)
Calcium: 9 mg/dL (ref 8.9–10.3)
Chloride: 101 mmol/L (ref 98–111)
Creatinine, Ser: 1.12 mg/dL (ref 0.61–1.24)
GFR calc Af Amer: >60 mL/min (ref >60)
GFR calc non Af Amer: >60 mL/min (ref >60)
Glucose, Bld: 94 mg/dL (ref 70–99)
Potassium: 4.1 mmol/L (ref 3.5–5.1)
Sodium: 137 mmol/L (ref 135–145)

## 2019-01-24 LAB — CBC
HCT: 38 % — ABNORMAL LOW (ref 39.0–52.0)
Hemoglobin: 12.3 g/dL — ABNORMAL LOW (ref 13.0–17.0)
MCH: 28.4 pg (ref 26.0–34.0)
MCHC: 32.4 g/dL (ref 30.0–36.0)
MCV: 87.8 fL (ref 80.0–100.0)
Platelets: 195 10*3/uL (ref 150–400)
RBC: 4.33 MIL/uL (ref 4.22–5.81)
RDW: 13.8 % (ref 11.5–15.5)
WBC: 9 10*3/uL (ref 4.0–10.5)
nRBC: 0 % (ref 0.0–0.2)

## 2019-01-24 LAB — HEPARIN LEVEL (UNFRACTIONATED): Heparin Unfractionated: 0.65 IU/mL (ref 0.30–0.70)

## 2019-01-24 MED ORDER — SACUBITRIL-VALSARTAN 24-26 MG PO TABS: 1.0000 | ORAL_TABLET | Freq: Two times a day (BID) | ORAL | 1 refills | Status: DC

## 2019-01-24 MED ORDER — FUROSEMIDE 40 MG PO TABS: 40.0000 mg | ORAL_TABLET | Freq: Every day | ORAL | 1 refills | Status: DC

## 2019-01-24 MED ORDER — APIXABAN 5 MG PO TABS
10.0000 mg | ORAL_TABLET | Freq: Two times a day (BID) | ORAL | Status: DC
  Administered 2019-01-24: 10 mg via ORAL
  Filled 2019-01-24: qty 2

## 2019-01-24 MED ORDER — ATORVASTATIN CALCIUM 40 MG PO TABS: 40.0000 mg | ORAL_TABLET | Freq: Every day | ORAL | 1 refills | Status: DC

## 2019-01-24 MED ORDER — FUROSEMIDE 40 MG PO TABS
40.0000 mg | ORAL_TABLET | Freq: Every day | ORAL | Status: DC
  Administered 2019-01-24: 40 mg via ORAL
  Filled 2019-01-24: qty 1

## 2019-01-24 MED ORDER — CARVEDILOL 12.5 MG PO TABS: 12.5000 mg | ORAL_TABLET | Freq: Two times a day (BID) | ORAL | 1 refills | Status: DC

## 2019-01-24 MED ORDER — ELIQUIS 5 MG VTE STARTER PACK: ORAL_TABLET | ORAL | 0 refills | Status: DC

## 2019-01-24 MED FILL — CARVEDILOL 12.5 MG TABLET: 12.5 | 30 days supply | Qty: 60 | Fill #0

## 2019-01-24 MED FILL — ATORVASTATIN CALCIUM 40 MG: 40 | 30 days supply | Qty: 30 | Fill #0

## 2019-01-24 MED FILL — ENTRESTO 24 MG-26 MG TABLET: 24-26 | 30 days supply | Qty: 60 | Fill #0

## 2019-01-24 MED FILL — FUROSEMIDE 40 MG TABLET: 40 | 30 days supply | Qty: 30 | Fill #0

## 2019-01-24 MED FILL — ELIQUIS STARTER PACK 5 MG T: 5 | 30 days supply | Qty: 74 | Fill #0

## 2019-01-24 NOTE — Progress Notes (Signed)
Progress Note  Patient Name: Andrew Vaughn Date of Encounter: 01/24/2019  Primary Cardiologist: Donato Heinz, MD   Subjective   Net negative 900cc yesterday (5.1L on admission) on IV lasix 20 mg, with stable Cr (1.1).  Reports dyspnea significantly improved, states that he walked halls yesterday without any dyspnea.    Inpatient Medications    Scheduled Meds:  atorvastatin  40 mg Oral q1800   carvedilol  12.5 mg Oral BID WC   furosemide  40 mg Oral Daily   sacubitril-valsartan  1 tablet Oral BID   sodium chloride flush  10-40 mL Intracatheter Q12H   sodium chloride flush  3 mL Intravenous Q12H   Travoprost (BAK Free)  1 drop Both Eyes QHS   Continuous Infusions:  sodium chloride     heparin 1,750 Units/hr (01/24/19 0208)   PRN Meds: sodium chloride, acetaminophen, albuterol, dextromethorphan-guaiFENesin, hydrALAZINE, metoprolol tartrate, morphine injection, nitroGLYCERIN, ondansetron (ZOFRAN) IV, sodium chloride flush, sodium chloride flush   Vital Signs    Vitals:   01/23/19 1716 01/23/19 1941 01/24/19 0323 01/24/19 0800  BP: 131/90 124/80 (!) 131/91 (!) 150/91  Pulse: 67 68 66 68  Resp:  19 14   Temp:  98.4 F (36.9 C) 98 F (36.7 C) 98.5 F (36.9 C)  TempSrc:  Oral Oral Oral  SpO2:  96% 96% 100%  Weight:   106.2 kg   Height:        Intake/Output Summary (Last 24 hours) at 01/24/2019 0829 Last data filed at 01/23/2019 1859 Gross per 24 hour  Intake 420 ml  Output 1450 ml  Net -1030 ml   Last 3 Weights 01/24/2019 01/23/2019 01/22/2019  Weight (lbs) 234 lb 3.2 oz 234 lb 1.6 oz 237 lb 14 oz  Weight (kg) 106.232 kg 106.187 kg 107.9 kg      Telemetry    Sinus rhythm with rates 60-70s, PVCs- Personally Reviewed  ECG    No new ECG  Physical Exam   GEN: No acute distress.   Neck: No JVD Cardiac: RRR, no murmurs, rubs, or gallops.  Respiratory: Clear to auscultation bilaterally. GI: Soft, nontender, non-distended  MS: 1+ BLE edema, L>R;  No deformity. Neuro:  Nonfocal  Psych: Normal affect   Labs    High Sensitivity Troponin:   Recent Labs  Lab 01/21/19 1620 01/21/19 1815 01/21/19 2317 01/22/19 0103 01/22/19 0307  TROPONINIHS 36* 39* 43* 43* 53*      Chemistry Recent Labs  Lab 01/21/19 1620 01/22/19 0307 01/23/19 0332 01/24/19 0308  NA 139 137 136 137  K 4.0 3.5 4.1 4.1  CL 103 102 101 101  CO2 25 23 24 24   GLUCOSE 126* 114* 97 94  BUN 20 13 17  27*  CREATININE 1.10 0.95 1.13 1.12  CALCIUM 9.1 9.2 9.1 9.0  PROT 7.0  --   --   --   ALBUMIN 4.1  --   --   --   AST 30  --   --   --   ALT 25  --   --   --   ALKPHOS 102  --   --   --   BILITOT 1.7*  --   --   --   GFRNONAA >60 >60 >60 >60  GFRAA >60 >60 >60 >60  ANIONGAP 11 12 11 12      Hematology Recent Labs  Lab 01/21/19 1620 01/24/19 0308  WBC 9.0 9.0  RBC 4.60 4.33  HGB 12.8* 12.3*  HCT 40.8 38.0*  MCV 88.7 87.8  MCH 27.8 28.4  MCHC 31.4 32.4  RDW 14.1 13.8  PLT 216 195    BNP Recent Labs  Lab 01/21/19 1620  BNP 341.4*     DDimer No results for input(s): DDIMER in the last 168 hours.   Radiology    Ct Coronary Morph W/cta Cor W/score W/ca W/cm &/or Wo/cm  Addendum Date: 01/23/2019   ADDENDUM REPORT: 01/23/2019 23:02 CLINICAL DATA:  45M with HTN presents with new HFrEF EXAM: Cardiac/Coronary  CTA TECHNIQUE: The patient was scanned on a [employer]. FINDINGS: A 100 kV prospective scan was triggered in the descending thoracic aorta at 111 HU's. Axial non-contrast 3 mm slices were carried out through the heart. The data set was analyzed on a dedicated work station and scored using the [employer]. Gantry rotation speed was 250 msecs and collimation was .6 mm. No beta blockade and 0.8 mg of sl NTG was given. The 3D data set was reconstructed in 5% intervals of the 67-82 % of the R-R cycle. Diastolic phases were analyzed on a dedicated work station using MPR, MIP and VRT modes. The patient received 80 cc of contrast. Aorta:  Ectatic, measures 19mm in tubular portion. No calcifications. No dissection. Aortic Valve:  Trileaflet.  No calcifications. Coronary Arteries:  Normal coronary origin.  Right dominance. RCA is a large dominant artery that gives rise to PDA and PLA. There is noncalcified plaque in the proximal RCA causing ~50% stenosis. There is noncalcified plaque in the mid RCA causing 25-49% stenosis. CTFFR across these lesions is 0.88. Left main is a large artery that gives rise to LAD and LCX arteries. LAD is a large vessel that has no plaque. Two small diagonal branches LCX is a non-dominant artery. There is noncalcified plaque in the proximal LCX causing minimal (1-24%) stenosis Other findings: Normal pulmonary vein drainage into the left atrium. Normal left atrial appendage without a thrombus. Normal size of the pulmonary artery. IMPRESSION: 1. Coronary calcium score of 0. This was 0 percentile for age and sex matched control. 2. Normal coronary origin with right dominance. 3. Nonobstructive CAD. There is noncalcified plaque in the proximal RCA causing ~50% stenosis. CTFFR across this lesion is 0.88, suggesting lesion is not functionally significant. CAD-RADS 3. Moderate stenosis. Consider symptom-guided anti-ischemic pharmacotherapy as well as risk factor modification per guideline directed care. Additional analysis with CT FFR will be submitted. Electronically Signed   By: Oswaldo Milian MD   On: 01/23/2019 23:02   Result Date: 01/23/2019 EXAM: OVER-READ INTERPRETATION  CT CHEST The following report is an over-read performed by radiologist Dr. Abigail Miyamoto of [facility], [place] on 01/23/2019. This over-read does not include interpretation of cardiac or coronary anatomy or pathology. The coronary CTA interpretation by the cardiologist is attached. COMPARISON:  CTA of 01/21/2019 FINDINGS: Vascular: Normal imaged aortic caliber. Aortic atherosclerosis. No central pulmonary embolism, on this non-dedicated study.  Mediastinum/Nodes: No imaged thoracic adenopathy Lungs/Pleura: Small, left greater than right pleural effusions. Minimally decreased. Bibasilar atelectasis. The 1 cm meter right lower lobe nodule described on the prior CT has resolved and was likely an area of atelectasis. Congestive heart failure has resolved. Upper Abdomen: Normal imaged portions of the liver, spleen, stomach. Artifact degradation from patient's right arm not being raised above the head. Musculoskeletal: No acute osseous abnormality. IMPRESSION: 1. Resolved pulmonary edema and decreased pleural effusions since the CTA of 01/21/2019. 2.  Aortic Atherosclerosis (ICD10-I70.0). Electronically Signed: By: Abigail Miyamoto M.D. On: 01/23/2019 13:21  Ct Coronary Fractional Flow Reserve Fluid Analysis  Result Date: 01/23/2019 EXAM: FFRCT ANALYSIS FINDINGS: FFRct analysis was performed on the original cardiac CT angiogram dataset. Diagrammatic representation of the FFRct analysis is provided in a separate PDF document in PACS. This dictation was created using the PDF document and an interactive 3D model of the results. 3D model is not available in the EMR/PACS. Normal FFR range is >0.80. 1. Left Main: No stenosis 2. LAD: No stenosis 3. LCX: CTFFR is 0.92 across proximal lesion, suggesting no functional significance 4. RCA: CTFFR across proximal and mid lesions is 0.88, suggesting no functional significance IMPRESSION: 1.  Nonobstructive CAD 2. CTFFR is 0.92 across proximal lesion in LCX, suggesting no functional significance 3. CTFFR across proximal and mid RCA lesions is 0.88, suggesting no functional significance Electronically Signed   By: Oswaldo Milian MD   On: 01/23/2019 23:15    Cardiac Studies   Echocardiogram 01/22/2019:  1. The left ventricle has mild-moderately reduced systolic function, with an ejection fraction of 40-45%. The cavity size was normal. There is mild concentric left ventricular hypertrophy. Left ventricular diastolic  Doppler parameters are consistent  with restrictive filling. Left ventrical global hypokinesis without regional wall motion abnormalities. 2. The right ventricle has mildly reduced systolic function. The cavity was mildly enlarged. There is no increase in right ventricular wall thickness. Right ventricular systolic pressure is mildly elevated with an estimated pressure of 41.0 mmHg. 3. Left atrial size was mildly dilated. 4. Right atrial size was mildly dilated. 5. Mitral valve regurgitation is mild to moderate by color flow Doppler. The MR jet is centrally-directed. 6. The aorta is normal unless otherwise noted. 7. The inferior vena cava was dilated in size with <50% respiratory variability.  Coronary CTA 01/23/19: nonobstructive CAD Patient Profile     63 y.o. male with a PMH of HTN and glaucoma who is being seen today for the evaluation of CHF and elevated troponin   Assessment & Plan    Acute combined CHF: patient presents with DOE, cough, and LE edema for the past 2 months. Recently started on doxycycline without improvement in SOB. CT Chest with bilateral pleural effusions. BNP elevated to 300s. Echo with EF 40-45%, G3DD, diffuse LV hypokinesis without RWMA, mild concentric LV hypertrophy, mildly reduced RV systolic dysfunction, mild biatrial enlargement, and mild-moderate MR. He was started on IV lasix. Coronary CTA with nonobstructive CAD.  Nonischemic cardiomyopathy, differential includes viral myocarditis, hypertension mediated  - Switch to PO lasix 40 mg daily - Discussed a cardiac MR to evaluate for myocarditis, but patient reports significant claustrophobia - Transition from losartan to entresto today - Continue carvedilol 12.5 mg daily  Nonobstructive CAD: calcium score 0, but noncalcified plaques causing ~50% RCA stenosis (CTFFR 0.88, suggesting lesion is not functionally significant) - Atorvastatin 40 mg daily - On Eliquis for DVT/PE, does not need daily  ASA  Hypertensive urgency: BP significantly elevated on arrival. Improved.  He reports being on losartan alone for the past few months after his HCTZ was not refilled (unclear reason). Possible elevated BP's could be related to frequent Nyquill use. - Started carvedilol - Transition from losartan to entresto today  HLD: LDL 92 this admission; has been as high as 134 in the past. He reports taking fish oil daily (not on his home medications) - Started atorvastatin as above.  Goal LDL <70 given CAD  Thoracic aortic aneurysm: noted to have a 4.2cm dilation of the ascending aorta.  - Continue routine annual monitoring with  CTA or MRA     [facility] will sign off.   Medication Recommendations:  Atorvastatin 40 mg, carvedilol 12.5 mg BID, entresto 24-26 mg BID, lasix 40 mg daily Other recommendations (labs, testing, etc):  BMET in 1 week Follow up as an outpatient:  Will schedule f/u appointment for next week   For questions or updates, please contact [place] Please consult [email or web address] for contact info under     Signed, Donato Heinz, MD  01/24/2019, 8:29 AM

## 2019-01-24 NOTE — Progress Notes (Addendum)
[place] for heparin to apixaban Indication: pulmonary embolus and DVT  Allergies  Allergen Reactions  . Shrimp [Shellfish Allergy] Nausea Only  . Erythromycin Rash    Patient Measurements: Height: 6' (182.9 cm) Weight: 234 lb 3.2 oz (106.2 kg)(scale B) IBW/kg (Calculated) : 77.6 Heparin Dosing Weight: 102kg  Vital Signs: Temp: 98.5 F (36.9 C) (09/03 0800) Temp Source: Oral (09/03 0800) BP: 150/91 (09/03 0800) Pulse Rate: 68 (09/03 0800)  Labs: Recent Labs    01/21/19 1620  01/21/19 2317 01/22/19 0103  01/22/19 0307 01/22/19 1131 01/23/19 0332 01/24/19 0308  HGB 12.8*  --   --   --   --   --   --   --  12.3*  HCT 40.8  --   --   --   --   --   --   --  38.0*  PLT 216  --   --   --   --   --   --   --  195  HEPARINUNFRC  --   --   --   --    < > 0.29* 0.49 0.79* 0.65  CREATININE 1.10  --   --   --   --  0.95  --  1.13 1.12  TROPONINIHS 36*   < > 43* 43*  --  53*  --   --   --    < > = values in this interval not displayed.    Estimated Creatinine Clearance: 87.2 mL/min (by C-G formula based on SCr of 1.12 mg/dL).  Assessment: 63 y.o. male with PE/DVT continues on heparin Heparin level therapeutic  CBC stable  To transition to Eliquis today  Goal of Therapy:  Heparin level 0.3-0.7 units/ml Monitor platelets by anticoagulation protocol: Yes   Plan:  DC heparin and heparin labs Eliquis 10 mg po BID x 7 days Will plan to obtain Eliquis discharge kit from [facility]  Thank you Anette Guarneri, PharmD [phone]  01/24/2019 8:51 AM

## 2019-01-24 NOTE — Discharge Summary (Signed)
Physician Discharge Summary  Andrew Vaughn [id number] DOB: 1956-03-12 DOA: 01/21/2019  PCP: Andrew Morale, MD  Admit date: 01/21/2019 Discharge date: 01/24/2019  Time spent: 45 minutes  Recommendations for Outpatient Follow-up:  Patient will be discharged to home.  Patient will need to follow up with primary care provider within one week of discharge, repeat BMP and LFTs.  Follow up with Dr. Gardiner Vaughn, cardiology. Patient should continue medications as prescribed.  Patient should follow a heart healthy diet.   Discharge Diagnoses:  Acute right lung pulmonary embolism/acute left lower extremity DVT Bilateral pleural effusions with acute CHF Hypertensive urgency Nodular opacity right lower lobe Dilated ascending aorta  Discharge Condition: Stable  Diet recommendation: heart healthy  Filed Weights   01/22/19 0534 01/23/19 0539 01/24/19 0323  Weight: 107.9 kg 106.2 kg 106.2 kg    History of present illness:  on 01/21/2019 by Dr. Vonita Vaughn [name] a 63 y.o.malewith medical history significant ofhypertension,glaucoma, erectiledysfunction,whopresents with shortness of breath.  Pt states that he has been havingshortness of breath for more than 1 month. He has dry cough and intermittent fever recently,which has resolved. No chest pain. Hasalso has bilateral leg edema (theleft legisworse thantheright).Pt was seen by PCP andhad negative COVID-19 test on 01/07/2019.Patient was treated empirically with10-daycourse of doxycycline by PCP. He just finished takingdoxycycline yesterday, but no significantimprovementof his SOB.Patient denies any nausea, vomiting, diarrhea, abdominal pain, symptoms of UTI or unilateral weakness.  Hospital Course:  Acute right lung pulmonary embolism/acute left lower extremity DVT -Patient states he has been somewhat sedentary in the last few months and working long hours as a Engineer, technical sales -CTA chest: Nonocclusive  pulmonary embolism involving the right lower lobe -Lower extremity DVT showed nonocclusive DVT Left posterior tibial vein -was placed on IV heparin -Echocardiogram EF of 40 to 45%.  Mild concentric LV hypertrophy.  LV diastolic Doppler parameters consistent with restrictive filling.  LV global hypokinesis without regional wall motion abnormalities. -Patient would like to move forward with Eliquis when possible -Case management consulted -Coronary CTA showed nonobstructive CAD  Bilateral pleural effusions with acute combined CHF -BNP 341, patient with bilateral lower extremity edema and pulmonary edema on chest x-ray consistent with CHF -Echocardiogram EF 40-45%, G3DD, diffuse LV hypokinesis without RWMA -Troponins mildly elevated likely due to demand ischemia secondary to PE -Continue aspirin -Monitor intake and output, daily weights -Was placed on IV Lasix and transitioned to oral Lasix, 40 mg daily -Transition from losartan to Andrew Vaughn today by cardiology -Continue Coreg 12.5 mg daily -Cardiology consulted and appreciated  Hypertensive urgency -BP 179/116 at the time of admission -Continue Lasix, Entresto, Coreg  Nodular opacity right lower lobe -CTA showed 1 cm rounded nodular opacity involving right lower lobe.  May represent a focus of atelectasis, infiltrate or pulmonary nodule -Will need repeat CT scan in 3 months-discussed this with patient  Dilated ascending aorta -CTA showed dilated ascending aorta measuring 4.2 cm -Patient will need to follow-up with PCP as an outpatient-discussed with patient  Hyperlipidemia -Lipid panel shows total cholesterol 130, HDL 29, LDL 92, triglycerides 43 -patient started on statin  Consultants Cardiology  Procedures  Echocardiogram Lower extremity Doppler Coronary CTA  Discharge Exam: Vitals:   01/24/19 0323 01/24/19 0800  BP: (!) 131/91 (!) 150/91  Pulse: 66 68  Resp: 14   Temp: 98 F (36.7 C) 98.5 F (36.9 C)  SpO2:  96% 100%     General: Well developed, well nourished, NAD, appears stated age  HEENT: NCAT, mucous membranes  moist.  Cardiovascular: S1 S2 auscultated, no rubs, murmurs or gallops. Regular rate and rhythm.  Respiratory: Clear to auscultation bilaterally with equal chest rise  Abdomen: Soft, nontender, nondistended, + bowel sounds  Extremities: warm dry without cyanosis clubbing. LLE edema > right (+1)  Neuro: AAOx3, nonfocal  Psych: pleasant, appropriate mood and affect  Discharge Instructions Discharge Instructions    Discharge instructions   Complete by: As directed    Patient will be discharged to home.  Patient will need to follow up with primary care provider within one week of discharge, repeat BMP and LFTs.  Follow up with Dr. Gardiner Vaughn, cardiology. Patient should continue medications as prescribed.  Patient should follow a heart healthy diet.     Allergies as of 01/24/2019      Reactions   Shrimp [shellfish Allergy] Nausea Only   Erythromycin Rash      Medication List    STOP taking these medications   doxycycline 100 MG capsule Commonly known as: VIBRAMYCIN   hydrochlorothiazide 12.5 MG capsule Commonly known as: Microzide   losartan 50 MG tablet Commonly known as: COZAAR   losartan-hydrochlorothiazide 50-12.5 MG tablet Commonly known as: HYZAAR   Viagra 100 MG tablet Generic drug: sildenafil     TAKE these medications   atorvastatin 40 MG tablet Commonly known as: LIPITOR Take 1 tablet (40 mg total) by mouth daily at 6 PM.   carvedilol 12.5 MG tablet Commonly known as: COREG Take 1 tablet (12.5 mg total) by mouth 2 (two) times daily with a meal.   Eliquis DVT/PE Starter Pack 5 MG Tabs Take as directed on package: start with two-5mg  tablets twice daily for 7 days. On day 8, switch to one-5mg  tablet twice daily.   Fish Oil 1200 MG Caps Take 1,200 mg by mouth daily.   furosemide 40 MG tablet Commonly known as: LASIX Take 1 tablet (40 mg total)  by mouth daily. Start taking on: January 25, 2019   multivitamin capsule Take 1 capsule by mouth daily.   sacubitril-valsartan 24-26 MG Commonly known as: ENTRESTO Take 1 tablet by mouth 2 (two) times daily.   Travatan 0.004 % ophthalmic solution Generic drug: travoprost (benzalkonium) Place 1 drop into both eyes daily.   Turmeric 1053 MG Tabs Take 1,053 mg by mouth daily.   vitamin C 1000 MG tablet Take 1,000 mg by mouth daily.      Allergies  Allergen Reactions   Shrimp [Shellfish Allergy] Nausea Only   Erythromycin Rash   Follow-up Information    Andrew Morale, MD. Schedule an appointment as soon as possible for a visit in 1 week(s).   Specialty: Family Medicine Why: Hospital follow up Contact information: [place] [address] [phone]        Donato Heinz, MD .   Specialty: Cardiology Contact information: [address] [place] [address] [phone]            The results of significant diagnostics from this hospitalization (including imaging, microbiology, ancillary and laboratory) are listed below for reference.    Significant Diagnostic Studies: Dg Chest 2 View  Result Date: 01/21/2019 CLINICAL DATA:  Cough, shortness of breath EXAM: CHEST - 2 VIEW COMPARISON:  None. FINDINGS: Cardiomegaly. Mild diffuse interstitial pulmonary opacity and small bilateral pleural effusions. The visualized skeletal structures are unremarkable. IMPRESSION: Cardiomegaly. Mild diffuse interstitial pulmonary opacity and small bilateral pleural effusions, most consistent with edema. No focal airspace opacity. Electronically Signed   By: Cristie Hem  Laqueta Carina M.D.   On: 01/21/2019 15:47   Ct Angio Chest Pe W And/or Wo Contrast  Result Date: 01/21/2019 CLINICAL DATA:  Concern for PE.  Positive DVT study. EXAM: CT ANGIOGRAPHY CHEST WITH CONTRAST TECHNIQUE: Multidetector CT imaging of the chest was performed using the standard  protocol during bolus administration of intravenous contrast. Multiplanar CT image reconstructions and MIPs were obtained to evaluate the vascular anatomy. CONTRAST:  166mL OMNIPAQUE IOHEXOL 350 MG/ML SOLN COMPARISON:  None. FINDINGS: Cardiovascular: Contrast injection is sufficient to demonstrate satisfactory opacification of the pulmonary arteries to the segmental level.Findings are concerning for a small pulmonary embolus involving a segmental branch of the right lower lobe (axial series 6, image 183). No additional pulmonary emboli identified. The main pulmonary artery is within normal limits for size. There is no CT evidence of acute right heart strain. The ascending aorta is dilated measuring approximately 4.2 cm in diameter. The heart size is enlarged. There is a trace pericardial effusion. There is reflux of contrast into the IVC. Mediastinum/Nodes: --there are enlarged mediastinal and hilar lymph nodes. For example there is a right paratracheal lymph node measuring approximately 1.3 cm. --No axillary lymphadenopathy. --No supraclavicular lymphadenopathy. --Normal thyroid gland. --The esophagus is unremarkable Lungs/Pleura: There are moderate bilateral pleural effusions. There is diffuse interlobular septal thickening. There are areas of atelectasis at the right lung base. There is a rounded opacity involving the right lower lobe measuring approximately 1 cm (axial series 5, image 74). There is no pneumothorax. Upper Abdomen: No acute abnormality. Musculoskeletal: No chest wall abnormality. No acute or significant osseous findings. Review of the MIP images confirms the above findings. IMPRESSION: 1. Very tiny nonocclusive pulmonary embolus involving the right lower lobe as detailed above. 2. Overall findings most concerning for congestive heart failure including moderate-sized bilateral pleural effusions. 3. Mediastinal adenopathy, presumably reactive. 4. There is a 1 cm rounded nodular opacity involving the  right lower lobe as detailed above. This may represent a focus of atelectasis, infiltrate, or pulmonary nodule. A three-month follow-up CT of the chest is recommended to confirm resolution of this finding. 5. Dilated ascending aorta measuring 4.2 cm. Recommend annual imaging followup by CTA or MRA. This recommendation follows 2010 ACCF/AHA/AATS/ACR/ASA/SCA/SCAI/SIR/STS/SVM Guidelines for the Diagnosis and Management of Patients with Thoracic Aortic Disease. Circulation. 2010; [id number]. Aortic aneurysm NOS (ICD10-I71.9) These results were called by telephone at the time of interpretation on 01/21/2019 at 8:32 pm to the ED attending working with Carlisle Cater , who verbally acknowledged these results. Electronically Signed   By: Constance Holster M.D.   On: 01/21/2019 20:35   US Venous Img Lower  Left (dvt Study)  Result Date: 01/21/2019 CLINICAL DATA:  Left lower extremity pain EXAM: LEFT LOWER EXTREMITY VENOUS DOPPLER ULTRASOUND TECHNIQUE: Gray-scale sonography with graded compression, as well as color Doppler and duplex ultrasound were performed to evaluate the lower extremity deep venous systems from the level of the common femoral vein and including the common femoral, femoral, profunda femoral, popliteal and calf veins including the posterior tibial, peroneal and gastrocnemius veins when visible. The superficial great saphenous vein was also interrogated. Spectral Doppler was utilized to evaluate flow at rest and with distal augmentation maneuvers in the common femoral, femoral and popliteal veins. COMPARISON:  None. FINDINGS: Contralateral Common Femoral Vein: Respiratory phasicity is normal and symmetric with the symptomatic side. No evidence of thrombus. Normal compressibility. Common Femoral Vein: No evidence of thrombus. Normal compressibility, respiratory phasicity and response to augmentation. Saphenofemoral Junction: No evidence of thrombus.  Normal compressibility and flow on color Doppler  imaging. Profunda Femoral Vein: No evidence of thrombus. Normal compressibility and flow on color Doppler imaging. Femoral Vein: No evidence of thrombus. Normal compressibility, respiratory phasicity and response to augmentation. Popliteal Vein: No evidence of thrombus. Normal compressibility, respiratory phasicity and response to augmentation. Calf Veins: There appears to be nonocclusive thrombus in the posterior tibial vein. The peroneal vein was not visualized. Superficial Great Saphenous Vein: No evidence of thrombus. Normal compressibility. Venous Reflux:  None. Other Findings:  None. IMPRESSION: Study positive for a nonocclusive DVT in the posterior tibial vein. Electronically Signed   By: Constance Holster M.D.   On: 01/21/2019 19:43   Ct Coronary Morph W/cta Cor W/score W/ca W/cm &/or Wo/cm  Addendum Date: 01/23/2019   ADDENDUM REPORT: 01/23/2019 23:02 CLINICAL DATA:  89M with HTN presents with new HFrEF EXAM: Cardiac/Coronary  CTA TECHNIQUE: The patient was scanned on a [employer]. FINDINGS: A 100 kV prospective scan was triggered in the descending thoracic aorta at 111 HU's. Axial non-contrast 3 mm slices were carried out through the heart. The data set was analyzed on a dedicated work station and scored using the [place]. Gantry rotation speed was 250 msecs and collimation was .6 mm. No beta blockade and 0.8 mg of sl NTG was given. The 3D data set was reconstructed in 5% intervals of the 67-82 % of the R-R cycle. Diastolic phases were analyzed on a dedicated work station using MPR, MIP and VRT modes. The patient received 80 cc of contrast. Aorta: Ectatic, measures 36mm in tubular portion. No calcifications. No dissection. Aortic Valve:  Trileaflet.  No calcifications. Coronary Arteries:  Normal coronary origin.  Right dominance. RCA is a large dominant artery that gives rise to PDA and PLA. There is noncalcified plaque in the proximal RCA causing ~50% stenosis. There is noncalcified  plaque in the mid RCA causing 25-49% stenosis. CTFFR across these lesions is 0.88. Left main is a large artery that gives rise to LAD and LCX arteries. LAD is a large vessel that has no plaque. Two small diagonal branches LCX is a non-dominant artery. There is noncalcified plaque in the proximal LCX causing minimal (1-24%) stenosis Other findings: Normal pulmonary vein drainage into the left atrium. Normal left atrial appendage without a thrombus. Normal size of the pulmonary artery. IMPRESSION: 1. Coronary calcium score of 0. This was 0 percentile for age and sex matched control. 2. Normal coronary origin with right dominance. 3. Nonobstructive CAD. There is noncalcified plaque in the proximal RCA causing ~50% stenosis. CTFFR across this lesion is 0.88, suggesting lesion is not functionally significant. CAD-RADS 3. Moderate stenosis. Consider symptom-guided anti-ischemic pharmacotherapy as well as risk factor modification per guideline directed care. Additional analysis with CT FFR will be submitted. Electronically Signed   By: Oswaldo Milian MD   On: 01/23/2019 23:02   Result Date: 01/23/2019 EXAM: OVER-READ INTERPRETATION  CT CHEST The following report is an over-read performed by radiologist Dr. Abigail Miyamoto of [facility], [place] on 01/23/2019. This over-read does not include interpretation of cardiac or coronary anatomy or pathology. The coronary CTA interpretation by the cardiologist is attached. COMPARISON:  CTA of 01/21/2019 FINDINGS: Vascular: Normal imaged aortic caliber. Aortic atherosclerosis. No central pulmonary embolism, on this non-dedicated study. Mediastinum/Nodes: No imaged thoracic adenopathy Lungs/Pleura: Small, left greater than right pleural effusions. Minimally decreased. Bibasilar atelectasis. The 1 cm meter right lower lobe nodule described on the prior CT has resolved and was likely an area  of atelectasis. Congestive heart failure has resolved. Upper Abdomen: Normal imaged  portions of the liver, spleen, stomach. Artifact degradation from patient's right arm not being raised above the head. Musculoskeletal: No acute osseous abnormality. IMPRESSION: 1. Resolved pulmonary edema and decreased pleural effusions since the CTA of 01/21/2019. 2.  Aortic Atherosclerosis (ICD10-I70.0). Electronically Signed: By: Abigail Miyamoto M.D. On: 01/23/2019 13:21   Ct Coronary Fractional Flow Reserve Fluid Analysis  Result Date: 01/23/2019 EXAM: FFRCT ANALYSIS FINDINGS: FFRct analysis was performed on the original cardiac CT angiogram dataset. Diagrammatic representation of the FFRct analysis is provided in a separate PDF document in PACS. This dictation was created using the PDF document and an interactive 3D model of the results. 3D model is not available in the EMR/PACS. Normal FFR range is >0.80. 1. Left Main: No stenosis 2. LAD: No stenosis 3. LCX: CTFFR is 0.92 across proximal lesion, suggesting no functional significance 4. RCA: CTFFR across proximal and mid lesions is 0.88, suggesting no functional significance IMPRESSION: 1.  Nonobstructive CAD 2. CTFFR is 0.92 across proximal lesion in LCX, suggesting no functional significance 3. CTFFR across proximal and mid RCA lesions is 0.88, suggesting no functional significance Electronically Signed   By: Oswaldo Milian MD   On: 01/23/2019 23:15    Microbiology: Recent Results (from the past 240 hour(s))  SARS Coronavirus 2 [facility] order, Performed in [facility] hospital lab) Nasopharyngeal Nasopharyngeal Swab     Status: None   Collection Time: 01/21/19  6:50 PM   Specimen: Nasopharyngeal Swab  Result Value Ref Range Status   SARS Coronavirus 2 NEGATIVE NEGATIVE Final    Comment: (NOTE) If result is NEGATIVE SARS-CoV-2 target nucleic acids are NOT DETECTED. The SARS-CoV-2 RNA is generally detectable in upper and lower  respiratory specimens during the acute phase of infection. The lowest  concentration of SARS-CoV-2 viral copies  this assay can detect is 250  copies / mL. A negative result does not preclude SARS-CoV-2 infection  and should not be used as the sole basis for treatment or other  patient management decisions.  A negative result may occur with  improper specimen collection / handling, submission of specimen other  than nasopharyngeal swab, presence of viral mutation(s) within the  areas targeted by this assay, and inadequate number of viral copies  (<250 copies / mL). A negative result must be combined with clinical  observations, patient history, and epidemiological information. If result is POSITIVE SARS-CoV-2 target nucleic acids are DETECTED. The SARS-CoV-2 RNA is generally detectable in upper and lower  respiratory specimens dur ing the acute phase of infection.  Positive  results are indicative of active infection with SARS-CoV-2.  Clinical  correlation with patient history and other diagnostic information is  necessary to determine patient infection status.  Positive results do  not rule out bacterial infection or co-infection with other viruses. If result is PRESUMPTIVE POSTIVE SARS-CoV-2 nucleic acids MAY BE PRESENT.   A presumptive positive result was obtained on the submitted specimen  and confirmed on repeat testing.  While 2019 novel coronavirus  (SARS-CoV-2) nucleic acids may be present in the submitted sample  additional confirmatory testing may be necessary for epidemiological  and / or clinical management purposes  to differentiate between  SARS-CoV-2 and other Sarbecovirus currently known to infect humans.  If clinically indicated additional testing with an alternate test  methodology [id number]) is advised. The SARS-CoV-2 RNA is generally  detectable in upper and lower respiratory sp ecimens during the acute  phase of  infection. The expected result is Negative. Fact Sheet for Patients:  StrictlyIdeas.no Fact Sheet for Healthcare  Providers: BankingDealers.co.za This test is not yet approved or cleared by the [place] FDA and has been authorized for detection and/or diagnosis of SARS-CoV-2 by FDA under an Emergency Use Authorization (EUA).  This EUA will remain in effect (meaning this test can be used) for the duration of the COVID-19 declaration under Section 564(b)(1) of the Act, 21 U.S.C. section 360bbb-3(b)(1), unless the authorization is terminated or revoked sooner. Performed at [facility], [address] [place], [place] [address]      Labs: Basic Metabolic Panel: Recent Labs  Lab 01/21/19 1620 01/22/19 0307 01/23/19 0332 01/24/19 0308  NA 139 137 136 137  K 4.0 3.5 4.1 4.1  CL 103 102 101 101  CO2 25 23 24 24   GLUCOSE 126* 114* 97 94  BUN 20 13 17  27*  CREATININE 1.10 0.95 1.13 1.12  CALCIUM 9.1 9.2 9.1 9.0   Liver Function Tests: Recent Labs  Lab 01/21/19 1620  AST 30  ALT 25  ALKPHOS 102  BILITOT 1.7*  PROT 7.0  ALBUMIN 4.1   No results for input(s): LIPASE, AMYLASE in the last 168 hours. No results for input(s): AMMONIA in the last 168 hours. CBC: Recent Labs  Lab 01/21/19 1620 01/24/19 0308  WBC 9.0 9.0  NEUTROABS 7.4  --   HGB 12.8* 12.3*  HCT 40.8 38.0*  MCV 88.7 87.8  PLT 216 195   Cardiac Enzymes: No results for input(s): CKTOTAL, CKMB, CKMBINDEX, TROPONINI in the last 168 hours. BNP: BNP (last 3 results) Recent Labs    01/21/19 1620  BNP 341.4*    ProBNP (last 3 results) No results for input(s): PROBNP in the last 8760 hours.  CBG: No results for input(s): GLUCAP in the last 168 hours.     Signed:  Cristal Ford  [facility] 01/24/2019, 11:48 AM

## 2019-01-24 NOTE — TOC Transition Note (Signed)
Transition of Care [facility]) - CM/SW Discharge Note Marvetta Gibbons RN, BSN Transitions of Care Unit 4E- RN Case Manager [phone]   Patient Details  Name: Andrew Vaughn MRN: [id number] Date of Birth: [birth year]  Transition of Care [facility]) CM/SW Contact:  Dawayne Patricia, RN Phone Number: 01/24/2019, 3:29 PM   Clinical Narrative:    Pt stable for transition home today, has been started on Eliquis- copay cost $30, CM spoke with pt at bedside informed of coverage for drug- per pt he would like to use [facility] pharmacy for discharge- meds to be delivered to bedside prior to transition home. Pt also provided with copay assist card for further re-fills.    Final next level of care: Home/Self Care Barriers to Discharge: No Barriers Identified   Patient Goals and CMS Choice Patient states their goals for this hospitalization and ongoing recovery are:: return home      Discharge Placement  Home                     Discharge Plan and Services                DME Arranged: N/A DME Agency: NA         HH Agency: NA        Social Determinants of Health (SDOH) Interventions     Readmission Risk Interventions No flowsheet data found.

## 2019-01-24 NOTE — Progress Notes (Signed)
Patient with discharge order. Discharge instructions given with verbal understanding received from patient and wife Ashok Norris). Handouts on new medications given with emphasis placed on side-effects and what the patient needed to know about medication. IVs removed with catheters intact. Telemetry removed. Personal belongings packed by patient and spouse. Patient and spouse very appreciative of care. To receive medications through [facility].  All questions answered.

## 2019-01-29 DIAGNOSIS — Z85828 Personal history of other malignant neoplasm of skin: Secondary | ICD-10-CM | POA: Diagnosis not present

## 2019-01-29 DIAGNOSIS — L821 Other seborrheic keratosis: Secondary | ICD-10-CM | POA: Diagnosis not present

## 2019-01-29 DIAGNOSIS — L812 Freckles: Secondary | ICD-10-CM | POA: Diagnosis not present

## 2019-01-29 DIAGNOSIS — D1801 Hemangioma of skin and subcutaneous tissue: Secondary | ICD-10-CM | POA: Diagnosis not present

## 2019-01-29 DIAGNOSIS — L570 Actinic keratosis: Secondary | ICD-10-CM | POA: Diagnosis not present

## 2019-01-30 ENCOUNTER — Ambulatory Visit: Payer: BC Managed Care – PPO | Admitting: Cardiology

## 2019-01-30 ENCOUNTER — Other Ambulatory Visit: Payer: Self-pay

## 2019-01-30 VITALS — BP 122/80 | HR 68 | Temp 97.0°F | Ht 72.0 in | Wt 228.8 lb

## 2019-01-30 DIAGNOSIS — I10 Essential (primary) hypertension: Secondary | ICD-10-CM

## 2019-01-30 DIAGNOSIS — I2510 Atherosclerotic heart disease of native coronary artery without angina pectoris: Secondary | ICD-10-CM

## 2019-01-30 DIAGNOSIS — I5041 Acute combined systolic (congestive) and diastolic (congestive) heart failure: Secondary | ICD-10-CM

## 2019-01-30 DIAGNOSIS — E785 Hyperlipidemia, unspecified: Secondary | ICD-10-CM | POA: Diagnosis not present

## 2019-01-30 NOTE — Progress Notes (Signed)
Cardiology Office Note:    Date:  01/30/2019   ID:  Andrew Vaughn, DOB 10/18/1955, MRN [id number]  PCP:  [name], MD  Cardiologist:  Donato Heinz, MD  Electrophysiologist:  None   Referring MD: [name], MD   Chief complaint: heart failure  History of Present Illness:    Andrew Vaughn is a 64 y.o. male with a hx of hypertension and glaucoma who presents for hospital follow-up after recent admission for acute systolic and diastolic heart failure.  He was admitted to [facility] from 01/20/2022 to 01/24/2019.  He had presented with shortness of breath for the few months prior to admission.  In addition, he has been having intermittent fevers throughout the month of July, for which he completed a 9-day course of doxycycline.  Fevers resolved but he continued to have shortness of breath prompting his ED visit.  TTE on 01/22/2020 showed EF 40 to 45%, mild LVH, grade 3 diastolic dysfunction.  He was also found to have a PE.  Coronary CT showed nonobstructive CAD.  He reports that he has been doing well.  Weight has decreased from 245 lbs on discharge to 228 lbs today.  BP has ranged from 110/69-130/79.  Pulse 60-70s.  Denies any dyspnea, chest pain.  Continues to have some LE edema.  Denies any palpitations.  Denies any bleeding issues.  Has been going for 20 minute walks twice per day and denies any exertional symptoms.   Past Medical History:  Diagnosis Date  . Glaucoma    per Dr. Ellie Lunch  . Hypertension     Past Surgical History:  Procedure Laterality Date  . dislocated left shoulder  1993  . right knee giant cell bone tumor  1998   per Dr. Leonides Schanz at [facility]  . ruptured lumbar disc  1976  . torn meniscus left knee  8/08   Dr. French Ana    Current Medications: Current Meds  Medication Sig  . Ascorbic Acid (VITAMIN C) 1000 MG tablet Take 1,000 mg by mouth daily.  Marland Kitchen atorvastatin (LIPITOR) 40 MG tablet Take 1 tablet (40 mg total) by mouth daily at 6 PM.  .  carvedilol (COREG) 12.5 MG tablet Take 1 tablet (12.5 mg total) by mouth 2 (two) times daily with a meal.  . Eliquis DVT/PE Starter Pack (ELIQUIS STARTER PACK) 5 MG TABS Take as directed on package: start with two-5mg  tablets twice daily for 7 days. On day 8, switch to one-5mg  tablet twice daily.  . furosemide (LASIX) 40 MG tablet Take 1 tablet (40 mg total) by mouth daily.  . Multiple Vitamin (MULTIVITAMIN) capsule Take 1 capsule by mouth daily.  . Omega-3 Fatty Acids (FISH OIL) 1200 MG CAPS Take 1,200 mg by mouth daily.  . sacubitril-valsartan (ENTRESTO) 24-26 MG Take 1 tablet by mouth 2 (two) times daily.  . travoprost, benzalkonium, (TRAVATAN) 0.004 % ophthalmic solution Place 1 drop into both eyes daily.    . Turmeric 1053 MG TABS Take 1,053 mg by mouth daily.     Allergies:   Shrimp [shellfish allergy] and Erythromycin   Social History   Socioeconomic History  . Marital status: Married    Spouse name: Not on file  . Number of children: Not on file  . Years of education: Not on file  . Highest education level: Not on file  Occupational History  . Not on file  Social Needs  . Financial resource strain: Not on file  . Food insecurity  Worry: Not on file    Inability: Not on file  . Transportation needs    Medical: Not on file    Non-medical: Not on file  Tobacco Use  . Smoking status: Never Smoker  . Smokeless tobacco: Never Used  Substance and Sexual Activity  . Alcohol use: Yes    Alcohol/week: 0.0 standard drinks    Comment: occ  . Drug use: No  . Sexual activity: Not on file  Lifestyle  . Physical activity    Days per week: Not on file    Minutes per session: Not on file  . Stress: Not on file  Relationships  . Social Herbalist on phone: Not on file    Gets together: Not on file    Attends religious service: Not on file    Active member of club or organization: Not on file    Attends meetings of clubs or organizations: Not on file    Relationship  status: Not on file  Other Topics Concern  . Not on file  Social History Narrative  . Not on file     Family History: The patient's family history includes Atrial fibrillation in his father; CAD in his father; Dementia in his mother; Glaucoma in an other family member; Heart disease in an other family member; Heart failure in his father; Hypertension in an other family member; Stroke in an other family member.  ROS:   Please see the history of present illness.     All other systems reviewed and are negative.  EKGs/Labs/Other Studies Reviewed:    The following studies were reviewed today:   EKG:  EKG is not ordered today.    Recent Labs: 01/21/2019: ALT 25; B Natriuretic Peptide 341.4 01/24/2019: BUN 27; Creatinine, Ser 1.12; Hemoglobin 12.3; Platelets 195; Potassium 4.1; Sodium 137  Recent Lipid Panel    Component Value Date/Time   CHOL 130 01/22/2019 0307   TRIG 43 01/22/2019 0307   HDL 29 (L) 01/22/2019 0307   CHOLHDL 4.5 01/22/2019 0307   VLDL 9 01/22/2019 0307   LDLCALC 92 01/22/2019 0307    Physical Exam:    VS:  BP 122/80   Pulse 68   Temp (!) 97 F (36.1 C) (Temporal)   Ht 6' (1.829 m)   Wt 228 lb 12.8 oz (103.8 kg)   SpO2 97%   BMI 31.03 kg/m     Wt Readings from Last 3 Encounters:  01/30/19 228 lb 12.8 oz (103.8 kg)  01/24/19 234 lb 3.2 oz (106.2 kg)  09/19/17 249 lb 9.6 oz (113.2 kg)     GEN: Well nourished, well developed in no acute distress HEENT: Normal NECK: No JVD; No carotid bruits LYMPHATICS: No lymphadenopathy CARDIAC: RRR, no murmurs, rubs, gallops RESPIRATORY:  Clear to auscultation without rales, wheezing or rhonchi  ABDOMEN: Soft, non-tender, non-distended MUSCULOSKELETAL:  1+ LE edema, L>R SKIN: Warm and dry NEUROLOGIC:  Alert and oriented x 3 PSYCHIATRIC:  Normal affect   ASSESSMENT:    1. Acute combined systolic (congestive) and diastolic (congestive) heart failure ([place])   2. Essential hypertension   3. Coronary artery  disease involving native coronary artery of native heart without angina pectoris   4. Hyperlipidemia, unspecified hyperlipidemia type    PLAN:    In order of problems listed above:  Acute combined systolic and diastolic heart failure: NICM, unclear etiology, possibly related to uncontrolled HTN.  Given reported fevers for month prior to presentation, myocarditis also on differential.  Echo with EF 40-45%, G3DD, diffuse LV hypokinesis without RWMA, mild concentric LV hypertrophy, mildly reduced RV systolic dysfunction, and mild-moderate MR. Coronary CTA with nonobstructive CAD.   - Lasix 40 mg daily -Discusseda cardiac MR to evaluate for myocarditis, but patient reports significant claustrophobia. - Continue Entresto 24-26 mg.  Will check chemistry and if stable Cr and K+, increase dose of entresto -Continue carvedilol 12.5 mg daily - Repeat TTE in 3 months  Nonobstructive CAD: calcium score 0, but noncalcified plaques causing ~50% RCA stenosis (CTFFR 0.88, suggesting lesion is not functionally significant) - Atorvastatin 40 mg daily - On Eliquis for DVT/PE, does not need daily ASA  Hypertension:Appears controlled -Continue Entresto and carvedilol  HLD:LDL 92, started atorvastatin as above.  Goal LDL <70 given CAD  Thoracic aortic aneurysm:noted to have a 4.2cm dilation of the ascending aorta.  - Continue routine annual monitoring with CTA or MRA  RTC in 3 months   Medication Adjustments/Labs and Tests Ordered: Current medicines are reviewed at length with the patient today.  Concerns regarding medicines are outlined above.  Orders Placed This Encounter  Procedures  . Basic metabolic panel  . ECHOCARDIOGRAM COMPLETE   No orders of the defined types were placed in this encounter.   Patient Instructions  Medication Instructions:  The current medical regimen is effective;  continue present plan and medications.  If you need a refill on your cardiac medications before  your next appointment, please call your pharmacy.   Lab work: BMET today If you have labs (blood work) drawn today and your tests are completely normal, you will receive your results only by: Marland Kitchen MyChart Message (if you have MyChart) OR . A paper copy in the mail If you have any lab test that is abnormal or we need to change your treatment, we will call you to review the results.  Testing/Procedures: Echocardiogram (3 months) - Your physician has requested that you have an echocardiogram. Echocardiography is a painless test that uses sound waves to create images of your heart. It provides your doctor with information about the size and shape of your heart and how well your heart's chambers and valves are working. This procedure takes approximately one hour. There are no restrictions for this procedure. This will be performed at our [facility] location - [address], [address].   Follow-Up: At [facility], you and your health needs are our priority.  As part of our continuing mission to provide you with exceptional heart care, we have created designated Provider Care Teams.  These Care Teams include your primary Cardiologist (physician) and Advanced Practice Providers (APPs -  Physician Assistants and Nurse Practitioners) who all work together to provide you with the care you need, when you need it. . Follow up with Dr.Johnross Nabozny in 3 months.        Signed, Donato Heinz, MD  01/30/2019 5:11 PM    [facility]

## 2019-01-30 NOTE — Patient Instructions (Signed)
Medication Instructions:  The current medical regimen is effective;  continue present plan and medications.  If you need a refill on your cardiac medications before your next appointment, please call your pharmacy.   Lab work: BMET today If you have labs (blood work) drawn today and your tests are completely normal, you will receive your results only by: Marland Kitchen MyChart Message (if you have MyChart) OR . A paper copy in the mail If you have any lab test that is abnormal or we need to change your treatment, we will call you to review the results.  Testing/Procedures: Echocardiogram (3 months) - Your physician has requested that you have an echocardiogram. Echocardiography is a painless test that uses sound waves to create images of your heart. It provides your doctor with information about the size and shape of your heart and how well your heart's chambers and valves are working. This procedure takes approximately one hour. There are no restrictions for this procedure. This will be performed at our [facility] location - [address].   Follow-Up: At [facility], you and your health needs are our priority.  As part of our continuing mission to provide you with exceptional heart care, we have created designated Provider Care Teams.  These Care Teams include your primary Cardiologist (physician) and Advanced Practice Providers (APPs -  Physician Assistants and Nurse Practitioners) who all work together to provide you with the care you need, when you need it. . Follow up with Dr.Schumann in 3 months.

## 2019-01-31 LAB — BASIC METABOLIC PANEL
BUN/Creatinine Ratio: 16 (ref 10–24)
BUN: 17 mg/dL (ref 8–27)
CO2: 26 mmol/L (ref 20–29)
Calcium: 9.9 mg/dL (ref 8.6–10.2)
Chloride: 99 mmol/L (ref 96–106)
Creatinine, Ser: 1.06 mg/dL (ref 0.76–1.27)
GFR calc Af Amer: 87 mL/min/{1.73_m2} (ref >59)
GFR calc non Af Amer: 75 mL/min/{1.73_m2} (ref >59)
Glucose: 93 mg/dL (ref 65–99)
Potassium: 4.7 mmol/L (ref 3.5–5.2)
Sodium: 140 mmol/L (ref 134–144)

## 2019-02-01 ENCOUNTER — Other Ambulatory Visit: Payer: Self-pay

## 2019-02-01 DIAGNOSIS — Z79899 Other long term (current) drug therapy: Secondary | ICD-10-CM

## 2019-02-01 MED ORDER — ENTRESTO 49-51 MG PO TABS: 1.0000 | ORAL_TABLET | Freq: Two times a day (BID) | ORAL | 3 refills | Status: DC

## 2019-02-19 ENCOUNTER — Telehealth: Payer: Self-pay

## 2019-02-19 ENCOUNTER — Other Ambulatory Visit: Payer: Self-pay

## 2019-02-19 DIAGNOSIS — Z79899 Other long term (current) drug therapy: Secondary | ICD-10-CM

## 2019-02-19 MED ORDER — FUROSEMIDE 40 MG PO TABS: 40.0000 mg | ORAL_TABLET | Freq: Every day | ORAL | 3 refills | Status: DC

## 2019-02-19 MED ORDER — ATORVASTATIN CALCIUM 40 MG PO TABS: 40.0000 mg | ORAL_TABLET | Freq: Every day | ORAL | 3 refills | Status: DC

## 2019-02-19 MED ORDER — CARVEDILOL 12.5 MG PO TABS: 12.5000 mg | ORAL_TABLET | Freq: Two times a day (BID) | ORAL | 3 refills | Status: DC

## 2019-02-19 MED ORDER — APIXABAN 5 MG PO TABS: 5.0000 mg | ORAL_TABLET | Freq: Two times a day (BID) | ORAL | 1 refills | Status: DC

## 2019-02-19 NOTE — Telephone Encounter (Signed)
Spoke with pt. He state he is needing refill on all cardiac meds. New Rx sent.  Message sent to Pharm D for Eliquis refill.

## 2019-02-19 NOTE — Telephone Encounter (Signed)
73m 103.8kg Scr 1.06 01/30/19 lovw/schuman

## 2019-02-20 LAB — BASIC METABOLIC PANEL
BUN/Creatinine Ratio: 16 (ref 10–24)
BUN: 17 mg/dL (ref 8–27)
CO2: 25 mmol/L (ref 20–29)
Calcium: 9.8 mg/dL (ref 8.6–10.2)
Chloride: 98 mmol/L (ref 96–106)
Creatinine, Ser: 1.07 mg/dL (ref 0.76–1.27)
GFR calc Af Amer: 86 mL/min/{1.73_m2} (ref >59)
GFR calc non Af Amer: 75 mL/min/{1.73_m2} (ref >59)
Glucose: 82 mg/dL (ref 65–99)
Potassium: 4.5 mmol/L (ref 3.5–5.2)
Sodium: 139 mmol/L (ref 134–144)

## 2019-03-19 DIAGNOSIS — Z20828 Contact with and (suspected) exposure to other viral communicable diseases: Secondary | ICD-10-CM | POA: Diagnosis not present

## 2019-05-01 ENCOUNTER — Ambulatory Visit (HOSPITAL_COMMUNITY): Payer: BC Managed Care – PPO | Attending: Internal Medicine

## 2019-05-01 ENCOUNTER — Other Ambulatory Visit: Payer: Self-pay

## 2019-05-01 DIAGNOSIS — I5041 Acute combined systolic (congestive) and diastolic (congestive) heart failure: Secondary | ICD-10-CM

## 2019-05-05 NOTE — Progress Notes (Signed)
Cardiology Office Note:    Date:  05/06/2019   ID:  Andrew Vaughn, DOB [birth year], MRN [id number]  PCP:  [name], MD  Cardiologist:  Donato Heinz, MD  Electrophysiologist:  None   Referring MD: [name], MD    Chief complaint: heart failure  History of Present Illness:    [name] is a 63 y.o. male with a hx of hypertension and glaucoma who presents for follow-up.  He was admitted to [facility] from 01/20/2022 to 01/24/2019.  He had presented with shortness of breath for the few months prior to admission.  In addition, he has been having intermittent fevers throughout the month of July, for which he completed a 9-day course of doxycycline.  Fevers resolved but he continued to have shortness of breath prompting his ED visit.  TTE on 01/22/2020 showed EF 40 to 45%, mild LVH, grade 3 diastolic dysfunction.  He was also found to have a PE.  Coronary CT showed nonobstructive CAD.  He was started on Entresto and carvedilol.  Repeat TTE on 05/01/2019 showed normalization of LV function, but severe LVH and grade 3 diastolic dysfunction.  Since last clinic visit, he reports that he has been doing well.  His weight is down from 250 pounds on presentation to the hospital in August to 217 pounds currently.  Reports his BP has been well controlled, he brought his blood pressure log with him he has been running 100-130s/60-80s.  He denies any lightheadedness or dizziness.  Reports that he has been exercising every day, walking for 20 to 40 minutes on the weekdays and 1.5 to 2 hours on the weekend.  He denies any exertional chest pain or dyspnea.  Has been taking his Eliquis, denies any bleeding issues.   Past Medical History:  Diagnosis Date  . Glaucoma    per Dr. Ellie Lunch  . Hypertension     Past Surgical History:  Procedure Laterality Date  . dislocated left shoulder  1993  . right knee giant cell bone tumor  1998   per Dr. Leonides Schanz at [facility]  . ruptured lumbar disc   1976  . torn meniscus left knee  8/08   Dr. French Ana    Current Medications: Current Meds  Medication Sig  . apixaban (ELIQUIS) 5 MG TABS tablet Take 1 tablet (5 mg total) by mouth 2 (two) times daily.  . Ascorbic Acid (VITAMIN C) 1000 MG tablet Take 1,000 mg by mouth daily.  Marland Kitchen atorvastatin (LIPITOR) 40 MG tablet Take 1 tablet (40 mg total) by mouth daily at 6 PM.  . carvedilol (COREG) 12.5 MG tablet Take 1 tablet (12.5 mg total) by mouth 2 (two) times daily with a meal.  . furosemide (LASIX) 40 MG tablet Take 1 tablet (40 mg total) by mouth daily.  . Multiple Vitamin (MULTIVITAMIN) capsule Take 1 capsule by mouth daily.  . Omega-3 Fatty Acids (FISH OIL) 1200 MG CAPS Take 1,200 mg by mouth daily.  . sacubitril-valsartan (ENTRESTO) 49-51 MG Take 1 tablet by mouth 2 (two) times daily.  . travoprost, benzalkonium, (TRAVATAN) 0.004 % ophthalmic solution Place 1 drop into both eyes daily.    . Turmeric 1053 MG TABS Take 1,053 mg by mouth daily.     Allergies:   Shrimp [shellfish allergy] and Erythromycin   Social History   Socioeconomic History  . Marital status: Married    Spouse name: Not on file  . Number of children: Not on file  . Years of  education: Not on file  . Highest education level: Not on file  Occupational History  . Not on file  Tobacco Use  . Smoking status: Never Smoker  . Smokeless tobacco: Never Used  Substance and Sexual Activity  . Alcohol use: Yes    Alcohol/week: 0.0 standard drinks    Comment: occ  . Drug use: No  . Sexual activity: Not on file  Other Topics Concern  . Not on file  Social History Narrative  . Not on file   Social Determinants of Health   Financial Resource Strain:   . Difficulty of Paying Living Expenses: Not on file  Food Insecurity:   . Worried About Charity fundraiser in the Last Year: Not on file  . Ran Out of Food in the Last Year: Not on file  Transportation Needs:   . Lack of Transportation (Medical): Not on file  .  Lack of Transportation (Non-Medical): Not on file  Physical Activity:   . Days of Exercise per Week: Not on file  . Minutes of Exercise per Session: Not on file  Stress:   . Feeling of Stress : Not on file  Social Connections:   . Frequency of Communication with Friends and Family: Not on file  . Frequency of Social Gatherings with Friends and Family: Not on file  . Attends Religious Services: Not on file  . Active Member of Clubs or Organizations: Not on file  . Attends Archivist Meetings: Not on file  . Marital Status: Not on file     Family History: The patient's family history includes Atrial fibrillation in his father; CAD in his father; Dementia in his mother; Glaucoma in an other family member; Heart disease in an other family member; Heart failure in his father; Hypertension in an other family member; Stroke in an other family member.  ROS:   Please see the history of present illness.     All other systems reviewed and are negative.  EKGs/Labs/Other Studies Reviewed:    The following studies were reviewed today:   EKG:  EKG is not ordered today.    CTA chest 01/21/19: 1. Very tiny nonocclusive pulmonary embolus involving the right lower lobe as detailed above. 2. Overall findings most concerning for congestive heart failure including moderate-sized bilateral pleural effusions. 3. Mediastinal adenopathy, presumably reactive. 4. There is a 1 cm rounded nodular opacity involving the right lower lobe as detailed above. This may represent a focus of atelectasis, infiltrate, or pulmonary nodule. A three-month follow-up CT of the chest is recommended to confirm resolution of this finding. 5. Dilated ascending aorta measuring 4.2 cm. Recommend annual imaging followup by CTA or MRA. This recommendation follows 2010 ACCF/AHA/AATS/ACR/ASA/SCA/SCAI/SIR/STS/SVM Guidelines for the Diagnosis and Management of Patients with Thoracic Aortic Disease. Circulation. 2010; 121IM:6036419. Aortic aneurysm NOS (ICD10-I71.9)  TTE 01/22/19:  1. The left ventricle has mild-moderately reduced systolic function, with an ejection fraction of 40-45%. The cavity size was normal. There is mild concentric left ventricular hypertrophy. Left ventricular diastolic Doppler parameters are consistent  with restrictive filling. Left ventrical global hypokinesis without regional wall motion abnormalities.  2. The right ventricle has mildly reduced systolic function. The cavity was mildly enlarged. There is no increase in right ventricular wall thickness. Right ventricular systolic pressure is mildly elevated with an estimated pressure of 41.0 mmHg.  3. Left atrial size was mildly dilated.  4. Right atrial size was mildly dilated.  5. Mitral valve regurgitation is mild to moderate  by color flow Doppler. The MR jet is centrally-directed.  6. The aorta is normal unless otherwise noted.  7. The inferior vena cava was dilated in size with <50% respiratory variability.  Coronary CTA 01/23/19: 1. Coronary calcium score of 0. This was 0 percentile for age and sex matched control. 2. Normal coronary origin with right dominance. 3. Nonobstructive CAD. There is noncalcified plaque in the proximal RCA causing ~50% stenosis. CTFFR across this lesion is 0.88, suggesting lesion is not functionally significant. CAD-RADS 3. Moderate stenosis. Consider symptom-guided anti-ischemic pharmacotherapy as well as risk factor modification per guideline directed care. Additional analysis with CT FFR will be submitted.   TTE 05/01/19:  1. In the setting of severe concentric left ventricular hypertrophy, pattern of restrictive diastolic filling, and increased RV wall thickeness, cannot exclude a diagnosis of cardiac amyloidosis or other infiltrative process.  2. Left ventricular ejection fraction, by 3D calculation, is 57%. The left ventricle has normal function. There is severely increased left ventricular  hypertrophy.  3. Left ventricular diastolic parameters are consistent with Grade III diastolic dysfunction (restrictive).  4. Global right ventricle has normal systolic function.The right ventricular size is normal. Moderately increased right ventricular wall thickness.  5. Left atrial size was moderately dilated.  6. Right atrial size was mildly dilated.  7. The mitral valve is grossly normal. No evidence of mitral valve regurgitation. Mild mitral stenosis.  8. The tricuspid valve is normal in structure. Tricuspid valve regurgitation is trivial.  9. The aortic valve is tricuspid. Aortic valve regurgitation is trivial. No evidence of aortic valve sclerosis or stenosis. 10. The pulmonic valve was normal in structure. Pulmonic valve regurgitation is trivial. 11. Normal pulmonary artery systolic pressure. 12. The inferior vena cava is normal in size with <50% respiratory variability, suggesting right atrial pressure of 8 mmHg. 13. The left ventricle has no regional wall motion abnormalities.  Recent Labs: 01/21/2019: ALT 25; B Natriuretic Peptide 341.4 01/24/2019: Hemoglobin 12.3; Platelets 195 02/19/2019: BUN 17; Creatinine, Ser 1.07; Potassium 4.5; Sodium 139  Recent Lipid Panel    Component Value Date/Time   CHOL 130 01/22/2019 0307   TRIG 43 01/22/2019 0307   HDL 29 (L) 01/22/2019 0307   CHOLHDL 4.5 01/22/2019 0307   VLDL 9 01/22/2019 0307   LDLCALC 92 01/22/2019 0307    Physical Exam:    VS:  BP 124/82   Pulse 60   Temp (!) 97 F (36.1 C)   Ht 6' (1.829 m)   Wt 219 lb 3.2 oz (99.4 kg)   BMI 29.73 kg/m     Wt Readings from Last 3 Encounters:  05/06/19 219 lb 3.2 oz (99.4 kg)  01/30/19 228 lb 12.8 oz (103.8 kg)  01/24/19 234 lb 3.2 oz (106.2 kg)     GEN: Well nourished, well developed in no acute distress HEENT: Normal NECK: No JVD; No carotid bruits LYMPHATICS: No lymphadenopathy CARDIAC: RRR, no murmurs, rubs, gallops RESPIRATORY:  Clear to auscultation without rales,  wheezing or rhonchi  ABDOMEN: Soft, non-tender, non-distended MUSCULOSKELETAL:  Trace LLE edema SKIN: Warm and dry NEUROLOGIC:  Alert and oriented x 3 PSYCHIATRIC:  Normal affect   ASSESSMENT:    1. Chronic combined systolic and diastolic heart failure ([place])   2. Essential hypertension   3. Hyperlipidemia, unspecified hyperlipidemia type   4. Coronary artery disease involving native coronary artery of native heart without angina pectoris   5. Shortness of breath   6. PE (pulmonary thromboembolism) (HCC)   7. Lung nodule  PLAN:    In order of problems listed above:  Chronic combined systolic and diastolic heart failure: NICM, unclear etiology, possibly related to uncontrolled HTN.  Given reported fevers for month prior to presentation, myocarditis also on differential.  Echo 01/22/09 with EF 40-45%, G3DD, diffuse LV hypokinesis without RWMA, mild concentric LV hypertrophy, mildly reduced RV systolic dysfunction, and mild-moderate MR. Repeat Echo 05/01/19 showed normalization of LV systolic function, but severe concentric LVH.  Coronary CTA with nonobstructive CAD.   - Lasix 40 mg daily - Continue Entresto 49-51 mg.  Will check BMET -Continue carvedilol 12.5 mg daily - Given severe concentric hypertrophy and grade 3 diastolic dysfunction, will check cardiac MRI to evaluate for evidence of cardiac amyloidosis  Nonobstructive CAD: calcium score 0, but noncalcified plaques causing ~50% RCA stenosis (CTFFR 0.88, suggesting lesion is not functionally significant) - Atorvastatin 40 mg daily - On Eliquis for DVT/PE for planned 104-month course, does not need daily ASA  Hypertension:Appears controlled -Continue Entresto and carvedilol  HLD:LDL 92, started atorvastatin as above.  Goal LDL <70 given CAD, will recheck lipid panel  Thoracic aortic aneurysm:noted to have a 4.2cm dilation of the ascending aorta.  - Continue routine annual monitoring with CTA or MRA  Acute pulmonary  embolism/left lower extremity DVT: Diagnosed during hospitalization on 01/21/2019.  On Eliquis 5 mg twice daily  Pulmonary nodule: 1 cm rounded nodular opacity involving the right lower lobe noted on CT chest 01/21/2019, which could represent atelectasis, infiltrate, or pulmonary nodule.  54-month follow-up CT was recommended to confirm resolution.  Will repeat CT  RTC in 3 months   Medication Adjustments/Labs and Tests Ordered: Current medicines are reviewed at length with the patient today.  Concerns regarding medicines are outlined above.  Orders Placed This Encounter  Procedures  . MR Card Morphology Wo/W Cm  . CT Chest Wo Contrast  . Basic metabolic panel  . Lipid Profile   No orders of the defined types were placed in this encounter.   Patient Instructions  Medication Instructions:  Continue same medications   Lab Work: Bmet,Lipid panel today   Testing/Procedures: Schedule Cardiac MRI  Follow-Up: At [facility], you and your health needs are our priority.  As part of our continuing mission to provide you with exceptional heart care, we have created designated Provider Care Teams.  These Care Teams include your primary Cardiologist (physician) and Advanced Practice Providers (APPs -  Physician Assistants and Nurse Practitioners) who all work together to provide you with the care you need, when you need it.  Your next appointment:  3 months 07/2019  The format for your next appointment:  Office   Provider:  Dr.Shann Merrick      Signed, Donato Heinz, MD  05/06/2019 10:49 AM    [place]

## 2019-05-06 ENCOUNTER — Encounter: Payer: Self-pay | Admitting: Cardiology

## 2019-05-06 ENCOUNTER — Telehealth: Payer: Self-pay | Admitting: Cardiology

## 2019-05-06 ENCOUNTER — Other Ambulatory Visit: Payer: Self-pay

## 2019-05-06 ENCOUNTER — Ambulatory Visit: Payer: BC Managed Care – PPO | Admitting: Cardiology

## 2019-05-06 VITALS — BP 124/82 | HR 60 | Temp 97.0°F | Ht 72.0 in | Wt 219.2 lb

## 2019-05-06 DIAGNOSIS — I10 Essential (primary) hypertension: Secondary | ICD-10-CM

## 2019-05-06 DIAGNOSIS — I2699 Other pulmonary embolism without acute cor pulmonale: Secondary | ICD-10-CM

## 2019-05-06 DIAGNOSIS — I5042 Chronic combined systolic (congestive) and diastolic (congestive) heart failure: Secondary | ICD-10-CM

## 2019-05-06 DIAGNOSIS — R0602 Shortness of breath: Secondary | ICD-10-CM

## 2019-05-06 DIAGNOSIS — I2510 Atherosclerotic heart disease of native coronary artery without angina pectoris: Secondary | ICD-10-CM | POA: Diagnosis not present

## 2019-05-06 DIAGNOSIS — E785 Hyperlipidemia, unspecified: Secondary | ICD-10-CM | POA: Diagnosis not present

## 2019-05-06 DIAGNOSIS — R911 Solitary pulmonary nodule: Secondary | ICD-10-CM

## 2019-05-06 LAB — BASIC METABOLIC PANEL
BUN/Creatinine Ratio: 19 (ref 10–24)
BUN: 20 mg/dL (ref 8–27)
CO2: 26 mmol/L (ref 20–29)
Calcium: 9.8 mg/dL (ref 8.6–10.2)
Chloride: 100 mmol/L (ref 96–106)
Creatinine, Ser: 1.08 mg/dL (ref 0.76–1.27)
GFR calc Af Amer: 85 mL/min/{1.73_m2} (ref >59)
GFR calc non Af Amer: 73 mL/min/{1.73_m2} (ref >59)
Glucose: 93 mg/dL (ref 65–99)
Potassium: 4.4 mmol/L (ref 3.5–5.2)
Sodium: 140 mmol/L (ref 134–144)

## 2019-05-06 LAB — LIPID PANEL
Chol/HDL Ratio: 2.3 ratio (ref 0.0–5.0)
Cholesterol, Total: 91 mg/dL — ABNORMAL LOW (ref 100–199)
HDL: 40 mg/dL (ref >39)
LDL Chol Calc (NIH): 37 mg/dL (ref 0–99)
Triglycerides: 61 mg/dL (ref 0–149)
VLDL Cholesterol Cal: 14 mg/dL (ref 5–40)

## 2019-05-06 NOTE — Telephone Encounter (Signed)
Left message for patient to call and scheduled chest CT ordered by Dr. Gardiner Rhyme

## 2019-05-06 NOTE — Patient Instructions (Addendum)
Medication Instructions:  Continue same medications   Lab Work: Bmet,Lipid panel today   Testing/Procedures: Schedule Cardiac MRI  Follow-Up: At [facility], you and your health needs are our priority.  As part of our continuing mission to provide you with exceptional heart care, we have created designated Provider Care Teams.  These Care Teams include your primary Cardiologist (physician) and Advanced Practice Providers (APPs -  Physician Assistants and Nurse Practitioners) who all work together to provide you with the care you need, when you need it.  Your next appointment:  3 months 07/2019  The format for your next appointment:  Office   Provider:  Dr.Schumann

## 2019-05-07 ENCOUNTER — Telehealth: Payer: Self-pay | Admitting: Cardiology

## 2019-05-07 ENCOUNTER — Encounter: Payer: Self-pay | Admitting: Cardiology

## 2019-05-07 NOTE — Telephone Encounter (Signed)
Left message regarding appointment for Cardiac MRI scheduled 05/31/19 at 8:00 am at [facility]---arrival time 7:15 am 1st floor admissions office.  I will mail information to the patient and it is also available in My Chart.

## 2019-05-14 ENCOUNTER — Other Ambulatory Visit: Payer: Self-pay

## 2019-05-14 ENCOUNTER — Ambulatory Visit (INDEPENDENT_AMBULATORY_CARE_PROVIDER_SITE_OTHER)
Admission: RE | Admit: 2019-05-14 | Discharge: 2019-05-14 | Disposition: A | Discharge status: Home / Self Care | Payer: BC Managed Care – PPO | Source: Ambulatory Visit | Attending: Cardiology | Admitting: Cardiology

## 2019-05-14 DIAGNOSIS — J984 Other disorders of lung: Secondary | ICD-10-CM | POA: Diagnosis not present

## 2019-05-14 DIAGNOSIS — I700 Atherosclerosis of aorta: Secondary | ICD-10-CM | POA: Diagnosis not present

## 2019-05-14 DIAGNOSIS — R911 Solitary pulmonary nodule: Secondary | ICD-10-CM

## 2019-05-14 DIAGNOSIS — R0602 Shortness of breath: Secondary | ICD-10-CM

## 2019-05-14 DIAGNOSIS — I2510 Atherosclerotic heart disease of native coronary artery without angina pectoris: Secondary | ICD-10-CM

## 2019-05-31 ENCOUNTER — Inpatient Hospital Stay (HOSPITAL_COMMUNITY): Admission: RE | Admit: 2019-05-31 | Payer: BC Managed Care – PPO | Source: Ambulatory Visit

## 2019-06-05 DIAGNOSIS — H5202 Hypermetropia, left eye: Secondary | ICD-10-CM | POA: Diagnosis not present

## 2019-06-05 DIAGNOSIS — H401131 Primary open-angle glaucoma, bilateral, mild stage: Secondary | ICD-10-CM | POA: Diagnosis not present

## 2019-06-05 DIAGNOSIS — H5211 Myopia, right eye: Secondary | ICD-10-CM | POA: Diagnosis not present

## 2019-08-05 NOTE — Progress Notes (Signed)
Cardiology Office Note:    Date:  08/09/2019   ID:  Andrew Vaughn, DOB 1955/11/16, MRN [id number]  PCP:  [name], MD  Cardiologist:  Donato Heinz, MD  Electrophysiologist:  None   Referring MD: [name], MD    Chief complaint: heart failure  History of Present Illness:    Andrew Vaughn is a 64 y.o. male with a hx of hypertension and glaucoma who presents for follow-up.  He was admitted to [facility] from 01/20/2022 to 01/24/2019.  He had presented with shortness of breath for the few months prior to admission.  In addition, he has been having intermittent fevers throughout the month of July, for which he completed a 9-day course of doxycycline.  Fevers resolved but he continued to have shortness of breath prompting his ED visit.  TTE on 01/22/2020 showed EF 40 to 45%, mild LVH, grade 3 diastolic dysfunction.  He was also found to have a PE.  Coronary CT showed nonobstructive CAD.  He was started on Entresto and carvedilol.  Repeat TTE on 05/01/2019 showed normalization of LV function, but severe LVH and grade 3 diastolic dysfunction.  CMR was ordered to evaluate severe LVH, but has not been done.  Since last clinic visit, he reports that he has been doing well.  He brought his blood pressure log with him, has been well controlled.  Denies any dyspnea, lower extremity edema, chest pain.  He walks every night for 30 minutes to 1 hour on the weekends walks for 1.5 hours.  Denies any exertional symptoms.  He continues to take Eliquis, denies any bleeding issues.  States that he has lost 38 pounds since last summer.     Past Medical History:  Diagnosis Date  . Glaucoma    per Dr. Ellie Lunch  . Hypertension     Past Surgical History:  Procedure Laterality Date  . dislocated left shoulder  1993  . right knee giant cell bone tumor  1998   per Dr. Leonides Schanz at [facility]  . ruptured lumbar disc  1976  . torn meniscus left knee  8/08   Dr. French Ana    Current  Medications: Current Meds  Medication Sig  . Ascorbic Acid (VITAMIN C) 1000 MG tablet Take 1,000 mg by mouth daily.  Marland Kitchen atorvastatin (LIPITOR) 40 MG tablet Take 1 tablet (40 mg total) by mouth daily at 6 PM.  . carvedilol (COREG) 12.5 MG tablet Take 1 tablet (12.5 mg total) by mouth 2 (two) times daily with a meal.  . furosemide (LASIX) 40 MG tablet Take 1 tablet (40 mg total) by mouth daily.  . Multiple Vitamin (MULTIVITAMIN) capsule Take 1 capsule by mouth daily.  . Omega-3 Fatty Acids (FISH OIL) 1200 MG CAPS Take 1,200 mg by mouth daily.  . sacubitril-valsartan (ENTRESTO) 49-51 MG Take 1 tablet by mouth 2 (two) times daily.  . travoprost, benzalkonium, (TRAVATAN) 0.004 % ophthalmic solution Place 1 drop into both eyes daily.    . Turmeric 1053 MG TABS Take 1,053 mg by mouth daily.  . [DISCONTINUED] apixaban (ELIQUIS) 5 MG TABS tablet Take 1 tablet (5 mg total) by mouth 2 (two) times daily.     Allergies:   Shrimp [shellfish allergy] and Erythromycin   Social History   Socioeconomic History  . Marital status: Married    Spouse name: Not on file  . Number of children: Not on file  . Years of education: Not on file  . Highest education level: Not  on file  Occupational History  . Not on file  Tobacco Use  . Smoking status: Never Smoker  . Smokeless tobacco: Never Used  Substance and Sexual Activity  . Alcohol use: Yes    Alcohol/week: 0.0 standard drinks    Comment: occ  . Drug use: No  . Sexual activity: Not on file  Other Topics Concern  . Not on file  Social History Narrative  . Not on file   Social Determinants of Health   Financial Resource Strain:   . Difficulty of Paying Living Expenses:   Food Insecurity:   . Worried About Charity fundraiser in the Last Year:   . Arboriculturist in the Last Year:   Transportation Needs:   . Film/video editor (Medical):   Marland Kitchen Lack of Transportation (Non-Medical):   Physical Activity:   . Days of Exercise per Week:   .  Minutes of Exercise per Session:   Stress:   . Feeling of Stress :   Social Connections:   . Frequency of Communication with Friends and Family:   . Frequency of Social Gatherings with Friends and Family:   . Attends Religious Services:   . Active Member of Clubs or Organizations:   . Attends Archivist Meetings:   Marland Kitchen Marital Status:      Family History: The patient's family history includes Atrial fibrillation in his father; CAD in his father; Dementia in his mother; Glaucoma in an other family member; Heart disease in an other family member; Heart failure in his father; Hypertension in an other family member; Stroke in an other family member.  ROS:   Please see the history of present illness.     All other systems reviewed and are negative.  EKGs/Labs/Other Studies Reviewed:    The following studies were reviewed today:   EKG:  EKG is ordered today and shows sinus rhythm, rate 62, Q waves in V1/V2, I/aVL, T wave inversions in inferior leads  CTA chest 01/21/19: 1. Very tiny nonocclusive pulmonary embolus involving the right lower lobe as detailed above. 2. Overall findings most concerning for congestive heart failure including moderate-sized bilateral pleural effusions. 3. Mediastinal adenopathy, presumably reactive. 4. There is a 1 cm rounded nodular opacity involving the right lower lobe as detailed above. This may represent a focus of atelectasis, infiltrate, or pulmonary nodule. A three-month follow-up CT of the chest is recommended to confirm resolution of this finding. 5. Dilated ascending aorta measuring 4.2 cm. Recommend annual imaging followup by CTA or MRA. This recommendation follows 2010 ACCF/AHA/AATS/ACR/ASA/SCA/SCAI/SIR/STS/SVM Guidelines for the Diagnosis and Management of Patients with Thoracic Aortic Disease. Circulation. 2010; [id number]. Aortic aneurysm NOS (ICD10-I71.9)  TTE 01/22/19:  1. The left ventricle has mild-moderately reduced  systolic function, with an ejection fraction of 40-45%. The cavity size was normal. There is mild concentric left ventricular hypertrophy. Left ventricular diastolic Doppler parameters are consistent  with restrictive filling. Left ventrical global hypokinesis without regional wall motion abnormalities.  2. The right ventricle has mildly reduced systolic function. The cavity was mildly enlarged. There is no increase in right ventricular wall thickness. Right ventricular systolic pressure is mildly elevated with an estimated pressure of 41.0 mmHg.  3. Left atrial size was mildly dilated.  4. Right atrial size was mildly dilated.  5. Mitral valve regurgitation is mild to moderate by color flow Doppler. The MR jet is centrally-directed.  6. The aorta is normal unless otherwise noted.  7. The inferior vena cava  was dilated in size with <50% respiratory variability.  Coronary CTA 01/23/19: 1. Coronary calcium score of 0. This was 0 percentile for age and sex matched control. 2. Normal coronary origin with right dominance. 3. Nonobstructive CAD. There is noncalcified plaque in the proximal RCA causing ~50% stenosis. CTFFR across this lesion is 0.88, suggesting lesion is not functionally significant. CAD-RADS 3. Moderate stenosis. Consider symptom-guided anti-ischemic pharmacotherapy as well as risk factor modification per guideline directed care. Additional analysis with CT FFR will be submitted.   TTE 05/01/19:  1. In the setting of severe concentric left ventricular hypertrophy, pattern of restrictive diastolic filling, and increased RV wall thickeness, cannot exclude a diagnosis of cardiac amyloidosis or other infiltrative process.  2. Left ventricular ejection fraction, by 3D calculation, is 57%. The left ventricle has normal function. There is severely increased left ventricular hypertrophy.  3. Left ventricular diastolic parameters are consistent with Grade III diastolic dysfunction  (restrictive).  4. Global right ventricle has normal systolic function.The right ventricular size is normal. Moderately increased right ventricular wall thickness.  5. Left atrial size was moderately dilated.  6. Right atrial size was mildly dilated.  7. The mitral valve is grossly normal. No evidence of mitral valve regurgitation. Mild mitral stenosis.  8. The tricuspid valve is normal in structure. Tricuspid valve regurgitation is trivial.  9. The aortic valve is tricuspid. Aortic valve regurgitation is trivial. No evidence of aortic valve sclerosis or stenosis. 10. The pulmonic valve was normal in structure. Pulmonic valve regurgitation is trivial. 11. Normal pulmonary artery systolic pressure. 12. The inferior vena cava is normal in size with <50% respiratory variability, suggesting right atrial pressure of 8 mmHg. 13. The left ventricle has no regional wall motion abnormalities.  Recent Labs: 01/21/2019: ALT 25; B Natriuretic Peptide 341.4 01/24/2019: Hemoglobin 12.3; Platelets 195 08/07/2019: BUN 16; Creatinine, Ser 1.00; Magnesium 2.1; Potassium 4.2; Sodium 139  Recent Lipid Panel    Component Value Date/Time   CHOL 91 (L) 05/06/2019 0859   TRIG 61 05/06/2019 0859   HDL 40 05/06/2019 0859   CHOLHDL 2.3 05/06/2019 0859   CHOLHDL 4.5 01/22/2019 0307   VLDL 9 01/22/2019 0307   LDLCALC 37 05/06/2019 0859    Physical Exam:    VS:  BP 118/66   Pulse 62   Temp (!) 97.5 F (36.4 C) (Temporal)   Resp 15   Ht 6' (1.829 m)   Wt 215 lb 6.4 oz (97.7 kg)   SpO2 98%   BMI 29.21 kg/m     Wt Readings from Last 3 Encounters:  08/07/19 215 lb 6.4 oz (97.7 kg)  05/06/19 219 lb 3.2 oz (99.4 kg)  01/30/19 228 lb 12.8 oz (103.8 kg)     GEN: Well nourished, well developed in no acute distress HEENT: Normal NECK: No JVD LYMPHATICS: No lymphadenopathy CARDIAC: RRR, no murmurs, rubs, gallops RESPIRATORY:  Clear to auscultation without rales, wheezing or rhonchi  ABDOMEN: Soft,  non-tender, non-distended MUSCULOSKELETAL:  Trace LE edema SKIN: Warm and dry NEUROLOGIC:  Alert and oriented x 3 PSYCHIATRIC:  Normal affect   ASSESSMENT:    1. Chronic combined systolic and diastolic heart failure ([place])   2. Essential hypertension   3. Coronary artery disease involving native coronary artery of native heart without angina pectoris   4. Medication management   5. Dilatation of aorta (HCC)    PLAN:    Chronic combined systolic and diastolic heart failure: NICM, unclear etiology, possibly related to uncontrolled HTN.  Given  reported fevers for month prior to presentation, myocarditis also on differential.  Echo 01/22/09 with EF 40-45%, G3DD, diffuse LV hypokinesis without RWMA, mild concentric LV hypertrophy, mildly reduced RV systolic dysfunction, and mild-moderate MR. Repeat Echo 05/01/19 showed normalization of LV systolic function, but severe concentric LVH.  Coronary CTA with nonobstructive CAD.   - Lasix 40 mg daily.  Appears euvolemic - Continue Entresto 49-51 mg.  Will check BMET, magnesium -Continue carvedilol 12.5 mg daily - Given severe concentric hypertrophy and grade 3 diastolic dysfunction, recommend cardiac MRI to evaluate for evidence of cardiac amyloidosis.    Nonobstructive CAD: calcium score 0, but noncalcified plaques causing ~50% RCA stenosis (CTFFR 0.88, suggesting lesion is not functionally significant) - Atorvastatin 40 mg daily - On Eliquis for DVT/PE for planned 42-month course, does not need daily ASA  Hypertension:Appears controlled -Continue Entresto and carvedilol  HLD:LDL 37 on 05/06/19, on atorvastatin 40 mg daily  Thoracic aortic aneurysm:noted to have a 4.2cm dilation of the ascending aorta.  - Plan or MRA at time of CMR as above to f/u aortic dilatation  Acute pulmonary embolism/left lower extremity DVT: Diagnosed during hospitalization on 01/21/2019.  On Eliquis 5 mg twice daily  Pulmonary nodule: 1 cm rounded nodular  opacity involving the right lower lobe noted on CT chest 01/21/2019, which could represent atelectasis, infiltrate, or pulmonary nodule.  Follow-up CT on 05/14/2019 showed resolution of nodule  RTC in 6 months   Medication Adjustments/Labs and Tests Ordered: Current medicines are reviewed at length with the patient today.  Concerns regarding medicines are outlined above.  Orders Placed This Encounter  Procedures  . MR Angiogram Chest W Wo Contrast  . Basic metabolic panel  . Magnesium  . EKG 12-Lead   No orders of the defined types were placed in this encounter.   Patient Instructions  Medication Instructions:  Your physician recommends that you continue on your current medications as directed. Please refer to the Current Medication list given to you today.  *If you need a refill on your cardiac medications before your next appointment, please call your pharmacy*   Lab Work: Today (BMET, Engineer, materials) If you have labs (blood work) drawn today and your tests are completely normal, you will receive your results only by: Marland Kitchen MyChart Message (if you have MyChart) OR . A paper copy in the mail If you have any lab test that is abnormal or we need to change your treatment, we will call you to review the results.   Testing/Procedures: Your physician has requested that you have a cardiac MRI AND MRA Aorta in September. Cardiac MRI uses a computer to create images of your heart as its beating, producing both still and moving pictures of your heart and major blood vessels. For further information please visit [email or web address] Please follow the instruction sheet given to you today for more information.   Follow-Up: At [facility], you and your health needs are our priority.  As part of our continuing mission to provide you with exceptional heart care, we have created designated Provider Care Teams.  These Care Teams include your primary Cardiologist (physician) and Advanced Practice Providers  (APPs -  Physician Assistants and Nurse Practitioners) who all work together to provide you with the care you need, when you need it.  We recommend signing up for the patient portal called "MyChart".  Sign up information is provided on this After Visit Summary.  MyChart is used to connect with patients for Virtual Visits (Telemedicine).  Patients are able  to view lab/test results, encounter notes, upcoming appointments, etc.  Non-urgent messages can be sent to your provider as well.   To learn more about what you can do with MyChart, go to [email or web address].    Your next appointment:   6 month(s)  The format for your next appointment:   In Person  Provider:   Oswaldo Milian, MD        Signed, Donato Heinz, MD  08/09/2019 5:46 PM    [place]

## 2019-08-07 ENCOUNTER — Encounter: Payer: Self-pay | Admitting: Cardiology

## 2019-08-07 ENCOUNTER — Other Ambulatory Visit: Payer: Self-pay

## 2019-08-07 ENCOUNTER — Ambulatory Visit: Payer: Self-pay | Admitting: Cardiology

## 2019-08-07 VITALS — BP 118/66 | HR 62 | Temp 97.5°F | Resp 15 | Ht 72.0 in | Wt 215.4 lb

## 2019-08-07 DIAGNOSIS — I10 Essential (primary) hypertension: Secondary | ICD-10-CM

## 2019-08-07 DIAGNOSIS — I2510 Atherosclerotic heart disease of native coronary artery without angina pectoris: Secondary | ICD-10-CM | POA: Diagnosis not present

## 2019-08-07 DIAGNOSIS — I5042 Chronic combined systolic (congestive) and diastolic (congestive) heart failure: Secondary | ICD-10-CM

## 2019-08-07 DIAGNOSIS — Z79899 Other long term (current) drug therapy: Secondary | ICD-10-CM | POA: Diagnosis not present

## 2019-08-07 DIAGNOSIS — I77819 Aortic ectasia, unspecified site: Secondary | ICD-10-CM

## 2019-08-07 DIAGNOSIS — I2699 Other pulmonary embolism without acute cor pulmonale: Secondary | ICD-10-CM

## 2019-08-07 LAB — BASIC METABOLIC PANEL
BUN/Creatinine Ratio: 16 (ref 10–24)
BUN: 16 mg/dL (ref 8–27)
CO2: 27 mmol/L (ref 20–29)
Calcium: 9.6 mg/dL (ref 8.6–10.2)
Chloride: 98 mmol/L (ref 96–106)
Creatinine, Ser: 1 mg/dL (ref 0.76–1.27)
GFR calc Af Amer: 93 mL/min/{1.73_m2} (ref >59)
GFR calc non Af Amer: 80 mL/min/{1.73_m2} (ref >59)
Glucose: 112 mg/dL — ABNORMAL HIGH (ref 65–99)
Potassium: 4.2 mmol/L (ref 3.5–5.2)
Sodium: 139 mmol/L (ref 134–144)

## 2019-08-07 LAB — MAGNESIUM: Magnesium: 2.1 mg/dL (ref 1.6–2.3)

## 2019-08-07 NOTE — Patient Instructions (Addendum)
Medication Instructions:  Your physician recommends that you continue on your current medications as directed. Please refer to the Current Medication list given to you today.  *If you need a refill on your cardiac medications before your next appointment, please call your pharmacy*   Lab Work: Today (BMET, Engineer, materials) If you have labs (blood work) drawn today and your tests are completely normal, you will receive your results only by: Marland Kitchen MyChart Message (if you have MyChart) OR . A paper copy in the mail If you have any lab test that is abnormal or we need to change your treatment, we will call you to review the results.   Testing/Procedures: Your physician has requested that you have a cardiac MRI AND MRA Aorta in September. Cardiac MRI uses a computer to create images of your heart as its beating, producing both still and moving pictures of your heart and major blood vessels. For further information please visit [email or web address] Please follow the instruction sheet given to you today for more information.   Follow-Up: At [facility], you and your health needs are our priority.  As part of our continuing mission to provide you with exceptional heart care, we have created designated Provider Care Teams.  These Care Teams include your primary Cardiologist (physician) and Advanced Practice Providers (APPs -  Physician Assistants and Nurse Practitioners) who all work together to provide you with the care you need, when you need it.  We recommend signing up for the patient portal called "MyChart".  Sign up information is provided on this After Visit Summary.  MyChart is used to connect with patients for Virtual Visits (Telemedicine).  Patients are able to view lab/test results, encounter notes, upcoming appointments, etc.  Non-urgent messages can be sent to your provider as well.   To learn more about what you can do with MyChart, go to [email or web address].    Your next appointment:   6  month(s)  The format for your next appointment:   In Person  Provider:   Oswaldo Milian, MD

## 2019-08-08 ENCOUNTER — Other Ambulatory Visit: Payer: Self-pay | Admitting: Cardiology

## 2019-12-23 ENCOUNTER — Telehealth: Payer: Self-pay | Admitting: Cardiology

## 2019-12-23 NOTE — Telephone Encounter (Signed)
Left message for patient to call to discuss scheduling Cardiac MRI and MRA chest aorta and follow up appointment with Dr. Gardiner Rhyme (all due in Setptmber 2021)

## 2019-12-26 NOTE — Telephone Encounter (Signed)
Left message for patient to call and discuss scheduling Cardiac MRI and MRA chest (in September) ordered by Dr. Gardiner Rhyme

## 2019-12-31 DIAGNOSIS — H532 Diplopia: Secondary | ICD-10-CM | POA: Diagnosis not present

## 2019-12-31 DIAGNOSIS — H2511 Age-related nuclear cataract, right eye: Secondary | ICD-10-CM | POA: Diagnosis not present

## 2020-01-01 ENCOUNTER — Other Ambulatory Visit: Payer: Self-pay | Admitting: Ophthalmology

## 2020-01-01 ENCOUNTER — Other Ambulatory Visit (HOSPITAL_COMMUNITY): Payer: Self-pay | Admitting: Ophthalmology

## 2020-01-01 DIAGNOSIS — H532 Diplopia: Secondary | ICD-10-CM

## 2020-01-06 ENCOUNTER — Other Ambulatory Visit: Payer: Self-pay | Admitting: *Deleted

## 2020-01-06 DIAGNOSIS — I5042 Chronic combined systolic (congestive) and diastolic (congestive) heart failure: Secondary | ICD-10-CM

## 2020-01-06 NOTE — Telephone Encounter (Signed)
Spoke with patient regarding preferred weekdays and times for the Cardiac MRI ordered by Dr. Efrain Sella day/time is ok---pt is concerned about the length of the testing and is afraid he will not be able to complete the test.  He told me to proceed and we will see what happens---informed patient I will be in touch with the appointment as soon as we hear from his insurance regarding the prior authorization

## 2020-01-08 ENCOUNTER — Encounter: Payer: Self-pay | Admitting: Cardiology

## 2020-01-08 NOTE — Telephone Encounter (Signed)
Spoke with patient regarding appointment for Cardiac MRI scheduled Monday 02/10/20 at 11:00 am at [facility]---arrival time is 10:30 am--1st floor admissions office---Will mail information to patient and it is also in My Chart.  Patient voiced his understanding.

## 2020-01-10 NOTE — Telephone Encounter (Signed)
Received staff message that patient wanted to cancel the Cardiac MRI and MRA chest scheduled 02/12/20 at [facility] to see if he wanted to reschedule--he does not wish to reschedule and states he cannot do MRI's

## 2020-01-15 ENCOUNTER — Encounter (HOSPITAL_COMMUNITY): Payer: Self-pay

## 2020-01-15 ENCOUNTER — Ambulatory Visit (HOSPITAL_COMMUNITY): Payer: BC Managed Care – PPO

## 2020-01-15 ENCOUNTER — Ambulatory Visit (HOSPITAL_COMMUNITY): Admission: RE | Admit: 2020-01-15 | Payer: BC Managed Care – PPO | Source: Ambulatory Visit

## 2020-01-30 DIAGNOSIS — L812 Freckles: Secondary | ICD-10-CM | POA: Diagnosis not present

## 2020-01-30 DIAGNOSIS — Z85828 Personal history of other malignant neoplasm of skin: Secondary | ICD-10-CM | POA: Diagnosis not present

## 2020-01-30 DIAGNOSIS — L723 Sebaceous cyst: Secondary | ICD-10-CM | POA: Diagnosis not present

## 2020-01-30 DIAGNOSIS — L821 Other seborrheic keratosis: Secondary | ICD-10-CM | POA: Diagnosis not present

## 2020-01-30 DIAGNOSIS — L570 Actinic keratosis: Secondary | ICD-10-CM | POA: Diagnosis not present

## 2020-02-07 ENCOUNTER — Other Ambulatory Visit: Payer: Self-pay | Admitting: Cardiology

## 2020-02-07 NOTE — Telephone Encounter (Signed)
Prescription refill request for Eliquis received. Indication: DVT Last office visit: 08/07/2019 Gardiner Rhyme Scr: 1.0 07/2019 Age: 64 Weight: 97.7 kg  Prescription refilled

## 2020-02-10 ENCOUNTER — Other Ambulatory Visit (HOSPITAL_COMMUNITY): Payer: Self-pay

## 2020-02-25 NOTE — Progress Notes (Signed)
Cardiology Office Note:    Date:  02/26/2020   ID:  Andrew Vaughn, DOB 30-Oct-1955, MRN [id number]  PCP:  [name], MD  Cardiologist:  Donato Heinz, MD  Electrophysiologist:  None   Referring MD: [name], MD    Chief complaint: heart failure  History of Present Illness:    Andrew Vaughn is a 64 y.o. male with a hx of hypertension and glaucoma who presents for follow-up.  He was admitted to [facility] from 01/20/2022 to 01/24/2019.  He had presented with shortness of breath for the few months prior to admission.  In addition, he has been having intermittent fevers throughout the month of July, for which he completed a 9-day course of doxycycline.  Fevers resolved but he continued to have shortness of breath prompting his ED visit.  TTE on 01/22/2020 showed EF 40 to 45%, mild LVH, grade 3 diastolic dysfunction.  He was also found to have a PE.  Coronary CT showed nonobstructive CAD.  He was started on Entresto and carvedilol.  Repeat TTE on 05/01/2019 showed normalization of LV function, but severe LVH and grade 3 diastolic dysfunction.  CMR was ordered to evaluate severe LVH, but has not been done.  Since last clinic visit, he reports that he has been doing well.  States that he walks 5 days/week for 30 minutes to 1 hour.  Denies any exertional chest pain or dyspnea.  He denies any lightheadedness, syncope, palpitations, or lower extremity edema.  Reports he has gone a few days without taking Lasix and notices his weight will be up.  He has continued on Lasix 40 mg daily.  Wt Readings from Last 3 Encounters:  02/26/20 220 lb 9.6 oz (100.1 kg)  08/07/19 215 lb 6.4 oz (97.7 kg)  05/06/19 219 lb 3.2 oz (99.4 kg)      Past Medical History:  Diagnosis Date  . Glaucoma    per Dr. Ellie Lunch  . Hypertension     Past Surgical History:  Procedure Laterality Date  . dislocated left shoulder  1993  . right knee giant cell bone tumor  1998   [facility] Dr. Leonides Schanz [facility]  .  ruptured lumbar disc  1976  . torn meniscus left knee  8/08   Dr. French Ana    Current Medications: Current Meds  Medication Sig  . Ascorbic Acid (VITAMIN C) 1000 MG tablet Take 1,000 mg by mouth daily.  Marland Kitchen atorvastatin (LIPITOR) 40 MG tablet Take 1 tablet (40 mg total) by mouth daily at 6 PM.  . carvedilol (COREG) 12.5 MG tablet Take 1 tablet (12.5 mg total) by mouth 2 (two) times daily with a meal. Pt must keep upcoming appt in Oct with provider for further refills  . furosemide (LASIX) 40 MG tablet Take 1 tablet (40 mg total) by mouth daily.  . Multiple Vitamin (MULTIVITAMIN) capsule Take 1 capsule by mouth daily.  . Omega-3 Fatty Acids (FISH OIL) 1200 MG CAPS Take 1,200 mg by mouth daily.  . sacubitril-valsartan (ENTRESTO) 49-51 MG Take 1 tablet by mouth 2 (two) times daily. Pt must keep upcoming appt in Oct with provider for further refills  . travoprost, benzalkonium, (TRAVATAN) 0.004 % ophthalmic solution Place 1 drop into both eyes daily.    . Turmeric 1053 MG TABS Take 1,053 mg by mouth daily.  . [DISCONTINUED] ELIQUIS 5 MG TABS tablet TAKE 1 TABLET(5 MG) BY MOUTH TWICE DAILY     Allergies:   Shrimp [shellfish allergy] and  Erythromycin   Social History   Socioeconomic History  . Marital status: Married    Spouse name: Not on file  . Number of children: Not on file  . Years of education: Not on file  . Highest education level: Not on file  Occupational History  . Not on file  Tobacco Use  . Smoking status: Never Smoker  . Smokeless tobacco: Never Used  Substance and Sexual Activity  . Alcohol use: Yes    Alcohol/week: 0.0 standard drinks    Comment: occ  . Drug use: No  . Sexual activity: Not on file  Other Topics Concern  . Not on file  Social History Narrative  . Not on file   Social Determinants of Health   Financial Resource Strain:   . Difficulty of Paying Living Expenses: Not on file  Food Insecurity:   . Worried About Charity fundraiser in the Last  Year: Not on file  . Ran Out of Food in the Last Year: Not on file  Transportation Needs:   . Lack of Transportation (Medical): Not on file  . Lack of Transportation (Non-Medical): Not on file  Physical Activity:   . Days of Exercise per Week: Not on file  . Minutes of Exercise per Session: Not on file  Stress:   . Feeling of Stress : Not on file  Social Connections:   . Frequency of Communication with Friends and Family: Not on file  . Frequency of Social Gatherings with Friends and Family: Not on file  . Attends Religious Services: Not on file  . Active Member of Clubs or Organizations: Not on file  . Attends Archivist Meetings: Not on file  . Marital Status: Not on file     Family History: The patient's family history includes Atrial fibrillation in his father; CAD in his father; Dementia in his mother; Glaucoma in an other family member; Heart disease in an other family member; Heart failure in his father; Hypertension in an other family member; Stroke in an other family member.  ROS:   Please see the history of present illness.     All other systems reviewed and are negative.  EKGs/Labs/Other Studies Reviewed:    The following studies were reviewed today:   EKG:  EKG is ordered today and shows sinus rhythm, rate 58, right axis deviation, Q waves in V1/V2, I/aVL, T wave inversions in inferior leads  CTA chest 01/21/19: 1. Very tiny nonocclusive pulmonary embolus involving the right lower lobe as detailed above. 2. Overall findings most concerning for congestive heart failure including moderate-sized bilateral pleural effusions. 3. Mediastinal adenopathy, presumably reactive. 4. There is a 1 cm rounded nodular opacity involving the right lower lobe as detailed above. This may represent a focus of atelectasis, infiltrate, or pulmonary nodule. A three-month follow-up CT of the chest is recommended to confirm resolution of this finding. 5. Dilated ascending aorta  measuring 4.2 cm. Recommend annual imaging followup by CTA or MRA. This recommendation follows 2010 ACCF/AHA/AATS/ACR/ASA/SCA/SCAI/SIR/STS/SVM Guidelines for the Diagnosis and Management of Patients with Thoracic Aortic Disease. Circulation. 2010; 121: I951-O841. Aortic aneurysm NOS (ICD10-I71.9)  TTE 01/22/19:  1. The left ventricle has mild-moderately reduced systolic function, with an ejection fraction of 40-45%. The cavity size was normal. There is mild concentric left ventricular hypertrophy. Left ventricular diastolic Doppler parameters are consistent  with restrictive filling. Left ventrical global hypokinesis without regional wall motion abnormalities.  2. The right ventricle has mildly reduced systolic function. The cavity  was mildly enlarged. There is no increase in right ventricular wall thickness. Right ventricular systolic pressure is mildly elevated with an estimated pressure of 41.0 mmHg.  3. Left atrial size was mildly dilated.  4. Right atrial size was mildly dilated.  5. Mitral valve regurgitation is mild to moderate by color flow Doppler. The MR jet is centrally-directed.  6. The aorta is normal unless otherwise noted.  7. The inferior vena cava was dilated in size with <50% respiratory variability.  Coronary CTA 01/23/19: 1. Coronary calcium score of 0. This was 0 percentile for age and sex matched control. 2. Normal coronary origin with right dominance. 3. Nonobstructive CAD. There is noncalcified plaque in the proximal RCA causing ~50% stenosis. CTFFR across this lesion is 0.88, suggesting lesion is not functionally significant. CAD-RADS 3. Moderate stenosis. Consider symptom-guided anti-ischemic pharmacotherapy as well as risk factor modification per guideline directed care. Additional analysis with CT FFR will be submitted.   TTE 05/01/19:  1. In the setting of severe concentric left ventricular hypertrophy, pattern of restrictive diastolic filling, and increased RV  wall thickeness, cannot exclude a diagnosis of cardiac amyloidosis or other infiltrative process.  2. Left ventricular ejection fraction, by 3D calculation, is 57%. The left ventricle has normal function. There is severely increased left ventricular hypertrophy.  3. Left ventricular diastolic parameters are consistent with Grade III diastolic dysfunction (restrictive).  4. Global right ventricle has normal systolic function.The right ventricular size is normal. Moderately increased right ventricular wall thickness.  5. Left atrial size was moderately dilated.  6. Right atrial size was mildly dilated.  7. The mitral valve is grossly normal. No evidence of mitral valve regurgitation. Mild mitral stenosis.  8. The tricuspid valve is normal in structure. Tricuspid valve regurgitation is trivial.  9. The aortic valve is tricuspid. Aortic valve regurgitation is trivial. No evidence of aortic valve sclerosis or stenosis. 10. The pulmonic valve was normal in structure. Pulmonic valve regurgitation is trivial. 11. Normal pulmonary artery systolic pressure. 12. The inferior vena cava is normal in size with <50% respiratory variability, suggesting right atrial pressure of 8 mmHg. 13. The left ventricle has no regional wall motion abnormalities.  Recent Labs: 02/26/2020: BUN 15; Creatinine, Ser 1.10; Magnesium 2.1; Potassium 4.9; Sodium 141  Recent Lipid Panel    Component Value Date/Time   CHOL 90 (L) 02/26/2020 1119   TRIG 50 02/26/2020 1119   HDL 38 (L) 02/26/2020 1119   CHOLHDL 2.4 02/26/2020 1119   CHOLHDL 4.5 01/22/2019 0307   VLDL 9 01/22/2019 0307   LDLCALC 39 02/26/2020 1119    Physical Exam:    VS:  BP 126/82   Pulse (!) 58   Ht 6' (1.829 m)   Wt 220 lb 9.6 oz (100.1 kg)   SpO2 97%   BMI 29.92 kg/m     Wt Readings from Last 3 Encounters:  02/26/20 220 lb 9.6 oz (100.1 kg)  08/07/19 215 lb 6.4 oz (97.7 kg)  05/06/19 219 lb 3.2 oz (99.4 kg)     GEN: Well nourished, well  developed in no acute distress HEENT: Normal NECK: No JVD LYMPHATICS: No lymphadenopathy CARDIAC: RRR, no murmurs, rubs, gallops RESPIRATORY:  Clear to auscultation without rales, wheezing or rhonchi  ABDOMEN: Soft, non-tender, non-distended MUSCULOSKELETAL:  Trace LE edema SKIN: Warm and dry NEUROLOGIC:  Alert and oriented x 3 PSYCHIATRIC:  Normal affect   ASSESSMENT:    1. Chronic combined systolic and diastolic heart failure ([place])   2. Essential hypertension  3. Dilatation of aorta (HCC)   4. Coronary artery disease involving native coronary artery of native heart without angina pectoris   5. PE (pulmonary thromboembolism) (HCC)    PLAN:    Chronic combined systolic and diastolic heart failure: NICM, unclear etiology, possibly related to uncontrolled HTN.  Given reported fevers for month prior to presentation, myocarditis also on differential.  Echo 01/22/09 with EF 40-45%, G3DD, diffuse LV hypokinesis without RWMA, mild concentric LV hypertrophy, mildly reduced RV systolic dysfunction, and mild-moderate MR. Repeat Echo 05/01/19 showed normalization of LV systolic function, but severe concentric LVH.  Coronary CTA with nonobstructive CAD.   - Lasix 40 mg daily.  Appears euvolemic.  Will check BMET, magnesium - Continue Entresto 49-51 mg.   -Continue carvedilol 12.5 mg BID - Given severe concentric hypertrophy and grade 3 diastolic dysfunction, suspect due to previously uncontrolled hypertension but did discuss cardiac MRI to evaluate for amyloidosis.  Unable to get MRI due to claustrophobia.  Will check PYP scan and serum light chains  Nonobstructive CAD: calcium score 0, but noncalcified plaques causing ~50% RCA stenosis (CTFFR 0.88, suggesting lesion is not functionally significant) - Atorvastatin 40 mg daily - Aspirin 81 mg daily  Hypertension:Appears controlled -Continue Entresto and carvedilol  HLD:LDL 37 on 05/06/19, on atorvastatin 40 mg daily  Thoracic aortic  dilatationnoted to have a 4.2cm dilation of the ascending aorta.  -Will repeat CTA chest for monitoring  Acute pulmonary embolism/left lower extremity DVT: Diagnosed during hospitalization on 01/21/2019.  On Eliquis 5 mg twice daily.  PE thought to be provoked, due to sedentary lifestyle (working long hours as Optometrist, sitting at desk all day).  Completed 53-month course of anticoagulation, will discontinue.  Pulmonary nodule: 1 cm rounded nodular opacity involving the right lower lobe noted on CT chest 01/21/2019, which could represent atelectasis, infiltrate, or pulmonary nodule.  Follow-up CT on 05/14/2019 showed resolution of nodule  RTC in 6 months   Medication Adjustments/Labs and Tests Ordered: Current medicines are reviewed at length with the patient today.  Concerns regarding medicines are outlined above.  Orders Placed This Encounter  Procedures  . CT ANGIO CHEST AORTA W/CM & OR WO/CM  . Basic metabolic panel  . Magnesium  . Lipid panel  . Kappa/lambda light chains  . Basic metabolic panel  . Lipid panel  . Magnesium  . MYOCARDIAL AMYLOID IMAGING PLANAR AND SPECT  . EKG 12-Lead   Meds ordered this encounter  Medications  . aspirin EC 81 MG tablet    Sig: Take 1 tablet (81 mg total) by mouth daily. Swallow whole.    Dispense:  90 tablet    Refill:  3    Patient Instructions  Medication Instructions:  STOP Eliquis START Aspirin 81 mg daily  *If you need a refill on your cardiac medications before your next appointment, please call your pharmacy*   Lab Work: BMET, Mag, Lipid today, Kappa/lamda light chains  If you have labs (blood work) drawn today and your tests are completely normal, you will receive your results only by: Marland Kitchen MyChart Message (if you have MyChart) OR . A paper copy in the mail If you have any lab test that is abnormal or we need to change your treatment, we will call you to review the results.   Testing/Procedures: CTA  chest/aorta  Myocardial amyloid test  Follow-Up: At [facility], you and your health needs are our priority.  As part of our continuing mission to provide you with exceptional heart care, we  have created designated Provider Care Teams.  These Care Teams include your primary Cardiologist (physician) and Advanced Practice Providers (APPs -  Physician Assistants and Nurse Practitioners) who all work together to provide you with the care you need, when you need it.  We recommend signing up for the patient portal called "MyChart".  Sign up information is provided on this After Visit Summary.  MyChart is used to connect with patients for Virtual Visits (Telemedicine).  Patients are able to view lab/test results, encounter notes, upcoming appointments, etc.  Non-urgent messages can be sent to your provider as well.   To learn more about what you can do with MyChart, go to [email or web address].    Your next appointment:   6 month(s)  The format for your next appointment:   In Person  Provider:   Oswaldo Milian, MD        Signed, Donato Heinz, MD  02/26/2020 5:31 PM    [place]

## 2020-02-26 ENCOUNTER — Encounter: Payer: Self-pay | Admitting: Cardiology

## 2020-02-26 ENCOUNTER — Ambulatory Visit: Payer: BC Managed Care – PPO | Admitting: Cardiology

## 2020-02-26 ENCOUNTER — Other Ambulatory Visit: Payer: Self-pay

## 2020-02-26 VITALS — BP 126/82 | HR 58 | Ht 72.0 in | Wt 220.6 lb

## 2020-02-26 DIAGNOSIS — I2510 Atherosclerotic heart disease of native coronary artery without angina pectoris: Secondary | ICD-10-CM | POA: Diagnosis not present

## 2020-02-26 DIAGNOSIS — I77819 Aortic ectasia, unspecified site: Secondary | ICD-10-CM | POA: Diagnosis not present

## 2020-02-26 DIAGNOSIS — I2699 Other pulmonary embolism without acute cor pulmonale: Secondary | ICD-10-CM

## 2020-02-26 DIAGNOSIS — I10 Essential (primary) hypertension: Secondary | ICD-10-CM

## 2020-02-26 DIAGNOSIS — I5042 Chronic combined systolic (congestive) and diastolic (congestive) heart failure: Secondary | ICD-10-CM | POA: Diagnosis not present

## 2020-02-26 MED ORDER — ASPIRIN EC 81 MG PO TBEC: 81.0000 mg | DELAYED_RELEASE_TABLET | Freq: Every day | ORAL | 3 refills | Status: DC

## 2020-02-26 NOTE — Patient Instructions (Addendum)
Medication Instructions:  STOP Eliquis START Aspirin 81 mg daily  *If you need a refill on your cardiac medications before your next appointment, please call your pharmacy*   Lab Work: BMET, Mag, Lipid today, Kappa/lamda light chains  If you have labs (blood work) drawn today and your tests are completely normal, you will receive your results only by: Marland Kitchen MyChart Message (if you have MyChart) OR . A paper copy in the mail If you have any lab test that is abnormal or we need to change your treatment, we will call you to review the results.   Testing/Procedures: CTA chest/aorta  Myocardial amyloid test  Follow-Up: At [facility], you and your health needs are our priority.  As part of our continuing mission to provide you with exceptional heart care, we have created designated Provider Care Teams.  These Care Teams include your primary Cardiologist (physician) and Advanced Practice Providers (APPs -  Physician Assistants and Nurse Practitioners) who all work together to provide you with the care you need, when you need it.  We recommend signing up for the patient portal called "MyChart".  Sign up information is provided on this After Visit Summary.  MyChart is used to connect with patients for Virtual Visits (Telemedicine).  Patients are able to view lab/test results, encounter notes, upcoming appointments, etc.  Non-urgent messages can be sent to your provider as well.   To learn more about what you can do with MyChart, go to [email or web address].    Your next appointment:   6 month(s)  The format for your next appointment:   In Person  Provider:   Oswaldo Milian, MD

## 2020-02-27 LAB — KAPPA/LAMBDA LIGHT CHAINS
Ig Kappa Free Light Chain: 17.5 mg/L (ref 3.3–19.4)
Ig Lambda Free Light Chain: 8.7 mg/L (ref 5.7–26.3)
KAPPA/LAMBDA RATIO: 2.01 — ABNORMAL HIGH (ref 0.26–1.65)

## 2020-02-27 LAB — BASIC METABOLIC PANEL
BUN/Creatinine Ratio: 14 (ref 10–24)
BUN: 15 mg/dL (ref 8–27)
CO2: 27 mmol/L (ref 20–29)
Calcium: 10.1 mg/dL (ref 8.6–10.2)
Chloride: 102 mmol/L (ref 96–106)
Creatinine, Ser: 1.1 mg/dL (ref 0.76–1.27)
GFR calc Af Amer: 82 mL/min/{1.73_m2} (ref >59)
GFR calc non Af Amer: 71 mL/min/{1.73_m2} (ref >59)
Glucose: 93 mg/dL (ref 65–99)
Potassium: 4.9 mmol/L (ref 3.5–5.2)
Sodium: 141 mmol/L (ref 134–144)

## 2020-02-27 LAB — LIPID PANEL
Chol/HDL Ratio: 2.4 ratio (ref 0.0–5.0)
Cholesterol, Total: 90 mg/dL — ABNORMAL LOW (ref 100–199)
HDL: 38 mg/dL — ABNORMAL LOW (ref >39)
LDL Chol Calc (NIH): 39 mg/dL (ref 0–99)
Triglycerides: 50 mg/dL (ref 0–149)
VLDL Cholesterol Cal: 13 mg/dL (ref 5–40)

## 2020-02-27 LAB — MAGNESIUM: Magnesium: 2.1 mg/dL (ref 1.6–2.3)

## 2020-03-06 ENCOUNTER — Telehealth (HOSPITAL_COMMUNITY): Payer: Self-pay | Admitting: *Deleted

## 2020-03-06 NOTE — Telephone Encounter (Signed)
Close encounter 

## 2020-03-09 ENCOUNTER — Other Ambulatory Visit: Payer: Self-pay

## 2020-03-09 ENCOUNTER — Ambulatory Visit (INDEPENDENT_AMBULATORY_CARE_PROVIDER_SITE_OTHER)
Admission: RE | Admit: 2020-03-09 | Discharge: 2020-03-09 | Disposition: A | Discharge status: Home / Self Care | Payer: BC Managed Care – PPO | Source: Ambulatory Visit | Attending: Cardiology | Admitting: Cardiology

## 2020-03-09 DIAGNOSIS — M47814 Spondylosis without myelopathy or radiculopathy, thoracic region: Secondary | ICD-10-CM | POA: Diagnosis not present

## 2020-03-09 DIAGNOSIS — I77819 Aortic ectasia, unspecified site: Secondary | ICD-10-CM

## 2020-03-09 DIAGNOSIS — I517 Cardiomegaly: Secondary | ICD-10-CM | POA: Diagnosis not present

## 2020-03-09 MED ORDER — IOHEXOL 350 MG/ML SOLN
100.0000 mL | Freq: Once | INTRAVENOUS | Status: AC | PRN
  Administered 2020-03-09: 100 mL via INTRAVENOUS

## 2020-03-10 ENCOUNTER — Ambulatory Visit (HOSPITAL_COMMUNITY)
Admission: RE | Admit: 2020-03-10 | Discharge: 2020-03-10 | Disposition: A | Discharge status: Home / Self Care | Payer: BC Managed Care – PPO | Source: Ambulatory Visit | Attending: Cardiovascular Disease | Admitting: Cardiovascular Disease

## 2020-03-10 DIAGNOSIS — I5042 Chronic combined systolic (congestive) and diastolic (congestive) heart failure: Secondary | ICD-10-CM | POA: Insufficient documentation

## 2020-03-10 MED ORDER — TECHNETIUM TC 99M PYROPHOSPHATE
21.0000 | Freq: Once | INTRAVENOUS | Status: AC
  Administered 2020-03-10: 21 via INTRAVENOUS

## 2020-03-13 ENCOUNTER — Other Ambulatory Visit: Payer: Self-pay | Admitting: *Deleted

## 2020-03-13 DIAGNOSIS — E859 Amyloidosis, unspecified: Secondary | ICD-10-CM

## 2020-03-15 ENCOUNTER — Other Ambulatory Visit: Payer: Self-pay | Admitting: Cardiology

## 2020-03-16 NOTE — Telephone Encounter (Signed)
This is Dr. Schumann's pt 

## 2020-03-25 DIAGNOSIS — H35373 Puckering of macula, bilateral: Secondary | ICD-10-CM | POA: Diagnosis not present

## 2020-03-25 DIAGNOSIS — H532 Diplopia: Secondary | ICD-10-CM | POA: Diagnosis not present

## 2020-03-25 DIAGNOSIS — H26492 Other secondary cataract, left eye: Secondary | ICD-10-CM | POA: Diagnosis not present

## 2020-03-30 ENCOUNTER — Other Ambulatory Visit: Payer: Self-pay | Admitting: *Deleted

## 2020-03-30 DIAGNOSIS — E859 Amyloidosis, unspecified: Secondary | ICD-10-CM

## 2020-03-31 ENCOUNTER — Other Ambulatory Visit: Payer: Self-pay | Admitting: *Deleted

## 2020-04-01 ENCOUNTER — Other Ambulatory Visit: Payer: Self-pay | Admitting: *Deleted

## 2020-04-01 DIAGNOSIS — E859 Amyloidosis, unspecified: Secondary | ICD-10-CM

## 2020-04-09 ENCOUNTER — Telehealth: Payer: Self-pay | Admitting: Cardiology

## 2020-04-09 NOTE — Telephone Encounter (Signed)
Patient returning call for stress test results. 

## 2020-04-09 NOTE — Telephone Encounter (Signed)
Spoke with patient see result note 

## 2020-04-10 ENCOUNTER — Other Ambulatory Visit: Payer: Self-pay

## 2020-04-10 DIAGNOSIS — E859 Amyloidosis, unspecified: Secondary | ICD-10-CM | POA: Diagnosis not present

## 2020-04-14 ENCOUNTER — Encounter: Payer: Self-pay | Admitting: *Deleted

## 2020-04-15 LAB — PROTEIN ELECTROPHORESIS, URINE REFLEX
Albumin ELP, Urine: 0 %
Alpha-1-Globulin, U: 0 %
Alpha-2-Globulin, U: 0 %
Beta Globulin, U: 0 %
Gamma Globulin, U: 0 %
Protein, Ur: 6.8 mg/dL

## 2020-04-15 LAB — PE AND FLC, SERUM
A/G Ratio: 1.2 (ref 0.7–1.7)
Albumin ELP: 3.8 g/dL (ref 2.9–4.4)
Alpha 1: 0.3 g/dL (ref 0.0–0.4)
Alpha 2: 0.6 g/dL (ref 0.4–1.0)
Beta: 0.9 g/dL (ref 0.7–1.3)
Gamma Globulin: 1.2 g/dL (ref 0.4–1.8)
Globulin, Total: 3.1 g/dL (ref 2.2–3.9)
Ig Kappa Free Light Chain: 20.2 mg/L — ABNORMAL HIGH (ref 3.3–19.4)
Ig Lambda Free Light Chain: 11.2 mg/L (ref 5.7–26.3)
KAPPA/LAMBDA RATIO: 1.8 — ABNORMAL HIGH (ref 0.26–1.65)
Total Protein: 6.9 g/dL (ref 6.0–8.5)

## 2020-04-15 LAB — PROTEIN ELECTROPHORESIS, SERUM

## 2020-04-23 ENCOUNTER — Other Ambulatory Visit: Payer: Self-pay

## 2020-04-23 DIAGNOSIS — E859 Amyloidosis, unspecified: Secondary | ICD-10-CM

## 2020-04-23 DIAGNOSIS — H26492 Other secondary cataract, left eye: Secondary | ICD-10-CM | POA: Diagnosis not present

## 2020-04-27 ENCOUNTER — Other Ambulatory Visit: Payer: Self-pay | Admitting: Cardiology

## 2020-04-27 NOTE — Telephone Encounter (Signed)
This is Dr. Schumann's pt 

## 2020-04-30 ENCOUNTER — Telehealth: Payer: Self-pay | Admitting: *Deleted

## 2020-04-30 ENCOUNTER — Encounter: Payer: BC Managed Care – PPO | Admitting: Genetic Counselor

## 2020-04-30 ENCOUNTER — Telehealth: Payer: Self-pay | Admitting: Hematology and Oncology

## 2020-04-30 NOTE — Telephone Encounter (Signed)
Left message to call back  

## 2020-04-30 NOTE — Telephone Encounter (Signed)
Received a new pt referral from Dr. Gardiner Rhyme for amyloidosis. Mr. Andrew Vaughn has been cld and scheduled to see Dr. Lorenso Courier on 12/20 at 2pm. Pt aware to arrive 30 minutes early.

## 2020-04-30 NOTE — Telephone Encounter (Signed)
Spoke to patient-he will call hematology/oncology office back now to schedule appt.   Aware genetic testing still in process and will be in touch as soon as results received.   Patient verbalized understanding.

## 2020-05-07 DIAGNOSIS — Z20822 Contact with and (suspected) exposure to covid-19: Secondary | ICD-10-CM | POA: Diagnosis not present

## 2020-05-08 ENCOUNTER — Telehealth: Payer: Self-pay | Admitting: Hematology and Oncology

## 2020-05-08 NOTE — Telephone Encounter (Signed)
Andrew Vaughn cld to reschedule his new pt appt to 1/5 at 2pm w/Dr. Lorenso Courier.

## 2020-05-11 ENCOUNTER — Inpatient Hospital Stay: Payer: BC Managed Care – PPO | Admitting: Hematology and Oncology

## 2020-05-11 ENCOUNTER — Inpatient Hospital Stay: Payer: BC Managed Care – PPO

## 2020-05-21 ENCOUNTER — Telehealth: Payer: Self-pay | Admitting: Hematology and Oncology

## 2020-05-21 NOTE — Telephone Encounter (Signed)
Rescheduled appointment per 12/30 inbasket msg from [employer]. Patient had to quarantine after a positive covid test. Spoke to patient who is aware of updated appointment date and time.

## 2020-05-27 ENCOUNTER — Inpatient Hospital Stay: Payer: BC Managed Care – PPO | Admitting: Hematology and Oncology

## 2020-05-27 ENCOUNTER — Inpatient Hospital Stay: Payer: BC Managed Care – PPO

## 2020-05-29 ENCOUNTER — Encounter: Payer: BC Managed Care – PPO | Admitting: Hematology and Oncology

## 2020-05-29 ENCOUNTER — Telehealth: Payer: Self-pay | Admitting: *Deleted

## 2020-05-29 ENCOUNTER — Telehealth: Payer: Self-pay | Admitting: Hematology and Oncology

## 2020-05-29 ENCOUNTER — Other Ambulatory Visit: Payer: BC Managed Care – PPO

## 2020-05-29 NOTE — Telephone Encounter (Signed)
TCT patient as he did not show for his new patient appt today. Spoke with him and he states he had a conflict and could not make today's appt.  He thought we knew this.  He also states he has a cough. He prefers to wait 2 weeks before we schedule his appt with Dr. Lorenso Courier.  Scheduling message sent to Isaiah Blakes, New patient Scheduler

## 2020-05-29 NOTE — Progress Notes (Signed)
Rescheduled

## 2020-05-29 NOTE — Telephone Encounter (Signed)
Mr. Aburto has been rescheduled to see Dr. Lorenso Courier on 1/21 at 1pm. Letter mailed.

## 2020-06-10 ENCOUNTER — Telehealth (HOSPITAL_COMMUNITY): Payer: Self-pay | Admitting: Vascular Surgery

## 2020-06-10 NOTE — Telephone Encounter (Signed)
Called pt to make  make new chf appt w/ [facility] , pt VM was full unable to leave message

## 2020-06-12 ENCOUNTER — Inpatient Hospital Stay: Payer: BC Managed Care – PPO

## 2020-06-12 ENCOUNTER — Encounter: Payer: Self-pay | Admitting: Hematology and Oncology

## 2020-06-12 ENCOUNTER — Other Ambulatory Visit: Payer: Self-pay

## 2020-06-12 ENCOUNTER — Inpatient Hospital Stay: Payer: BC Managed Care – PPO | Attending: Hematology and Oncology | Admitting: Hematology and Oncology

## 2020-06-12 VITALS — BP 155/85 | HR 62 | Temp 97.0°F | Resp 20 | Ht 71.0 in | Wt 215.3 lb

## 2020-06-12 DIAGNOSIS — Z8249 Family history of ischemic heart disease and other diseases of the circulatory system: Secondary | ICD-10-CM | POA: Insufficient documentation

## 2020-06-12 DIAGNOSIS — R768 Other specified abnormal immunological findings in serum: Secondary | ICD-10-CM | POA: Diagnosis not present

## 2020-06-12 DIAGNOSIS — Z8349 Family history of other endocrine, nutritional and metabolic diseases: Secondary | ICD-10-CM | POA: Insufficient documentation

## 2020-06-12 DIAGNOSIS — Z7982 Long term (current) use of aspirin: Secondary | ICD-10-CM | POA: Diagnosis not present

## 2020-06-12 DIAGNOSIS — D472 Monoclonal gammopathy: Secondary | ICD-10-CM | POA: Diagnosis not present

## 2020-06-12 DIAGNOSIS — I5030 Unspecified diastolic (congestive) heart failure: Secondary | ICD-10-CM | POA: Insufficient documentation

## 2020-06-12 DIAGNOSIS — I5084 End stage heart failure: Secondary | ICD-10-CM | POA: Diagnosis not present

## 2020-06-12 DIAGNOSIS — I110 Hypertensive heart disease with heart failure: Secondary | ICD-10-CM | POA: Diagnosis not present

## 2020-06-12 DIAGNOSIS — I425 Other restrictive cardiomyopathy: Secondary | ICD-10-CM | POA: Diagnosis not present

## 2020-06-12 DIAGNOSIS — Z79899 Other long term (current) drug therapy: Secondary | ICD-10-CM | POA: Insufficient documentation

## 2020-06-12 LAB — CMP (CANCER CENTER ONLY)
ALT: 20 U/L (ref 0–44)
AST: 20 U/L (ref 15–41)
Albumin: 4 g/dL (ref 3.5–5.0)
Alkaline Phosphatase: 158 U/L — ABNORMAL HIGH (ref 38–126)
Anion gap: 10 (ref 5–15)
BUN: 17 mg/dL (ref 8–23)
CO2: 29 mmol/L (ref 22–32)
Calcium: 9.3 mg/dL (ref 8.9–10.3)
Chloride: 101 mmol/L (ref 98–111)
Creatinine: 1.03 mg/dL (ref 0.61–1.24)
GFR, Estimated: >60 mL/min (ref >60)
Glucose, Bld: 89 mg/dL (ref 70–99)
Potassium: 4.2 mmol/L (ref 3.5–5.1)
Sodium: 140 mmol/L (ref 135–145)
Total Bilirubin: 1.1 mg/dL (ref 0.3–1.2)
Total Protein: 7.7 g/dL (ref 6.5–8.1)

## 2020-06-12 LAB — CBC WITH DIFFERENTIAL (CANCER CENTER ONLY)
Abs Immature Granulocytes: 0.03 10*3/uL (ref 0.00–0.07)
Basophils Absolute: 0 10*3/uL (ref 0.0–0.1)
Basophils Relative: 0 %
Eosinophils Absolute: 0.5 10*3/uL (ref 0.0–0.5)
Eosinophils Relative: 6 %
HCT: 39.4 % (ref 39.0–52.0)
Hemoglobin: 12.8 g/dL — ABNORMAL LOW (ref 13.0–17.0)
Immature Granulocytes: 0 %
Lymphocytes Relative: 13 %
Lymphs Abs: 1.1 10*3/uL (ref 0.7–4.0)
MCH: 28.7 pg (ref 26.0–34.0)
MCHC: 32.5 g/dL (ref 30.0–36.0)
MCV: 88.3 fL (ref 80.0–100.0)
Monocytes Absolute: 0.7 10*3/uL (ref 0.1–1.0)
Monocytes Relative: 7 %
Neutro Abs: 6.5 10*3/uL (ref 1.7–7.7)
Neutrophils Relative %: 74 %
Platelet Count: 206 10*3/uL (ref 150–400)
RBC: 4.46 MIL/uL (ref 4.22–5.81)
RDW: 13.2 % (ref 11.5–15.5)
WBC Count: 8.9 10*3/uL (ref 4.0–10.5)
nRBC: 0 % (ref 0.0–0.2)

## 2020-06-12 LAB — LACTATE DEHYDROGENASE: LDH: 162 U/L (ref 98–192)

## 2020-06-12 NOTE — Progress Notes (Signed)
[facility] [phone]   Fax:([phone] [place] NOTE  Patient Care Team: [name], MD as PCP - General Donato Heinz, MD as PCP - Cardiology (Cardiology)  Hematological/Oncological History # Abnormal Light Chain Ratio in Setting of Heart Failure  02/26/2020: Kappa 17.5, Lambda 8.7, Ratio 2.01 04/10/2020: Kappa 20.2, Lambda 11.2, Ratio 1.80. SPEP and UPEP showed no monoclonal protein 06/12/2020: establish care with Dr. Lorenso Courier   CHIEF COMPLAINTS/PURPOSE OF CONSULTATION:  "Abnormal Light Chain Ratio "  HISTORY OF PRESENTING ILLNESS:  Andrew Vaughn 65 y.o. male with medical history significant for glaucoma, HTN, Giant Cell bone tumor (s/p resection 7741), and diastolic heart failure who presents for evaluation of a monoclonal gammopathy.    On review of the previous records Andrew Vaughn is currently being worked up by cardiology for a restrictive cardiomyopathy.  Based on the imaging there is a concern for amyloidosis.  As part of the patient's work-up he had a serum free light channel panel drawn on 02/26/2020 which showed a kappa of 17.5, lambda 8.7, and a ratio of 2.01.  Subsequently on 04/10/2020 the patient had a repeat panel which showed a kappa of 20.2, lambda of 11.2, and a ratio of 1.8.  An SPEP and UPEP ordered that time showed no evidence of a monoclonal protein.  Due to concerns for this light chain ratio abnormality in the setting of heart failure the patient was referred to hematology for further evaluation and management.  On exam today Andrew Vaughn reports that his problems began in September 2019.  He reports that at that time he developed a blood clot and that eventually led to his diagnosis of congestive heart failure.  For this blood clot it is thought to be provoked due to sedentary profession as a Engineer, technical sales and he was placed on Eliquis for 1 years time.  He notes that the clot was "strange" and that he did not  have immediate symptoms but a slow onset of cramping pain in the back of his leg.  He also reports that he had COVID before Christmas and had issues with a dry cough.  He does not have any issues with lying flat or shortness of breath in other scenarios.  He currently denies any fevers, chills, sweats, nausea, vomiting or diarrhea.  He has had no recent weight loss.  A full 10 point ROS is listed below.  MEDICAL HISTORY:  Past Medical History:  Diagnosis Date  . Glaucoma    per Dr. Ellie Lunch  . Hypertension     SURGICAL HISTORY: Past Surgical History:  Procedure Laterality Date  . dislocated left shoulder  1993  . right knee giant cell bone tumor  1998   per Dr. Leonides Schanz at [facility]  . ruptured lumbar disc  1976  . torn meniscus left knee  8/08   Dr. French Ana    SOCIAL HISTORY: Social History   Socioeconomic History  . Marital status: Married    Spouse name: Not on file  . Number of children: 3  . Years of education: Not on file  . Highest education level: Not on file  Occupational History  . Not on file  Tobacco Use  . Smoking status: Never Smoker  . Smokeless tobacco: Never Used  Substance and Sexual Activity  . Alcohol use: Yes    Alcohol/week: 0.0 standard drinks    Comment: occ  . Drug use: No  . Sexual activity: Not on file  Other Topics  Concern  . Not on file  Social History Narrative  . Not on file   Social Determinants of Health   Financial Resource Strain: Not on file  Food Insecurity: Not on file  Transportation Needs: Not on file  Physical Activity: Not on file  Stress: Not on file  Social Connections: Not on file  Intimate Partner Violence: Not on file    FAMILY HISTORY: Family History  Problem Relation Age of Onset  . Hypertension Other   . Stroke Other        first degree male relative   . Heart disease Other   . Glaucoma Other   . Dementia Mother   . CAD Father        MI in his 40s  . Heart failure Father   . Atrial fibrillation  Father   . Hypothyroidism Sister     ALLERGIES:  is allergic to shrimp [shellfish allergy] and erythromycin.  MEDICATIONS:  Current Outpatient Medications  Medication Sig Dispense Refill  . Ascorbic Acid (VITAMIN C) 1000 MG tablet Take 1,000 mg by mouth daily.    Marland Kitchen aspirin EC 81 MG tablet Take 1 tablet (81 mg total) by mouth daily. Swallow whole. 90 tablet 3  . atorvastatin (LIPITOR) 40 MG tablet TAKE 1 TABLET(40 MG) BY MOUTH DAILY AT 6 PM 90 tablet 3  . carvedilol (COREG) 12.5 MG tablet Take 1 tablet (12.5 mg total) by mouth 2 (two) times daily with a meal. Pt must keep upcoming appt in Oct with provider for further refills 180 tablet 1  . furosemide (LASIX) 40 MG tablet TAKE 1 TABLET(40 MG) BY MOUTH DAILY 90 tablet 3  . Multiple Vitamin (MULTIVITAMIN) capsule Take 1 capsule by mouth daily.    . sacubitril-valsartan (ENTRESTO) 49-51 MG Take 1 tablet by mouth 2 (two) times daily. Pt must keep upcoming appt in Oct with provider for further refills 180 tablet 1  . travoprost, benzalkonium, (TRAVATAN) 0.004 % ophthalmic solution Place 1 drop into both eyes daily.       No current facility-administered medications for this visit.    REVIEW OF SYSTEMS:   Constitutional: ( - ) fevers, ( - )  chills , ( - ) night sweats Eyes: ( - ) blurriness of vision, ( - ) double vision, ( - ) watery eyes Ears, nose, mouth, throat, and face: ( - ) mucositis, ( - ) sore throat Respiratory: ( - ) cough, ( - ) dyspnea, ( - ) wheezes Cardiovascular: ( - ) palpitation, ( - ) chest discomfort, ( - ) lower extremity swelling Gastrointestinal:  ( - ) nausea, ( - ) heartburn, ( - ) change in bowel habits Skin: ( - ) abnormal skin rashes Lymphatics: ( - ) new lymphadenopathy, ( - ) easy bruising Neurological: ( - ) numbness, ( - ) tingling, ( - ) new weaknesses Behavioral/Psych: ( - ) mood change, ( - ) new changes  All other systems were reviewed with the patient and are negative.  PHYSICAL  EXAMINATION:  Vitals:   06/12/20 1301  BP: (!) 155/85  Pulse: 62  Resp: 20  Temp: (!) 97 F (36.1 C)  SpO2: 100%   Filed Weights   06/12/20 1301  Weight: 215 lb 4.8 oz (97.7 kg)    GENERAL: well appearing middle aged Caucasian male in NAD  SKIN: skin color, texture, turgor are normal, no rashes or significant lesions EYES: conjunctiva are pink and non-injected, sclera clear LUNGS: clear to auscultation and percussion with  normal breathing effort HEART: regular rate & rhythm and no murmurs and no lower extremity edema Musculoskeletal: no cyanosis of digits and no clubbing  PSYCH: alert & oriented x 3, fluent speech NEURO: no focal motor/sensory deficits  LABORATORY DATA:  I have reviewed the data as listed CBC Latest Ref Rng & Units 06/12/2020 01/24/2019 01/21/2019  WBC 4.0 - 10.5 K/uL 8.9 9.0 9.0  Hemoglobin 13.0 - 17.0 g/dL 12.8(L) 12.3(L) 12.8(L)  Hematocrit 39.0 - 52.0 % 39.4 38.0(L) 40.8  Platelets 150 - 400 K/uL 206 195 216    CMP Latest Ref Rng & Units 06/12/2020 04/10/2020 02/26/2020  Glucose 70 - 99 mg/dL 89 - 93  BUN 8 - 23 mg/dL 17 - 15  Creatinine 0.61 - 1.24 mg/dL 1.03 - 1.10  Sodium 135 - 145 mmol/L 140 - 141  Potassium 3.5 - 5.1 mmol/L 4.2 - 4.9  Chloride 98 - 111 mmol/L 101 - 102  CO2 22 - 32 mmol/L 29 - 27  Calcium 8.9 - 10.3 mg/dL 9.3 - 10.1  Total Protein 6.5 - 8.1 g/dL 7.7 6.9 -  Total Bilirubin 0.3 - 1.2 mg/dL 1.1 - -  Alkaline Phos 38 - 126 U/L 158(H) - -  AST 15 - 41 U/L 20 - -  ALT 0 - 44 U/L 20 - -    RADIOGRAPHIC STUDIES: No results found.  ASSESSMENT & PLAN Andrew Vaughn 65 y.o. male with medical history significant for glaucoma, HTN, Giant Cell bone tumor (s/p resection 0347), and diastolic heart failure who presents for evaluation of a monoclonal gammopathy.  After review the labs, reviewed the records, discussed with the patient the findings are most consistent with an abnormal light chain ratio in the setting of heart failure.  It may  be due to mild kidney dysfunction that the patient has a slightly offset ratio.  Interestingly this blood work does not show any evidence of a monoclonal component.  Without a monoclonal component this is does not likely represent an AL amyloidosis.  As such we would recommend continued work-up for consideration of TTR amyloidosis.  We do not currently recommend pursuing a bone marrow biopsy, however tissue could help make the diagnosis if the affected organ or if the fat pad was biopsied.  We will repeat the monoclonal gammopathy work-up today and if there is no evidence of monoclonal protein we would recommend that he continue to follow-up with cardiology.  # Abnormal Light Chain Ratio in Setting of Heart Failure  -- Findings are most consistent with a mild free light chain elevation that is not likely related to the patient's cardiac findings. -- In order to be certain we will order repeat SPEP, UPEP, and serum free light chains today. -- If there is concern as to what the etiology of the patient's amyloid may be would recommend obtaining tissue biopsy of the affected organ (heart).  In the event this was not feasible could consider a fat pad biopsy to look for amyloid protein. -- Without a monoclonal component this amyloidosis would not likely represent an AL amyloidosis.  Recommend continued work-up for consideration of TTR amyloidosis. --No clear indication for a bone marrow biopsy at this time. -- Return to clinic if any of the above labs are concerning for AL amyloidosis.  Orders Placed This Encounter  Procedures  . DG Bone Survey Met    Standing Status:   Future    Standing Expiration Date:   06/12/2021    Order Specific Question:   Reason  for Exam (SYMPTOM  OR DIAGNOSIS REQUIRED)    Answer:   MGUS evaluation    Order Specific Question:   Preferred imaging location?    Answer:   [facility]  . CBC with Differential ([place] Only)    Standing Status:   Future    Number of  Occurrences:   1    Standing Expiration Date:   06/12/2021  . CMP ([place] only)    Standing Status:   Future    Number of Occurrences:   1    Standing Expiration Date:   06/12/2021  . Lactate dehydrogenase (LDH)    Standing Status:   Future    Number of Occurrences:   1    Standing Expiration Date:   06/12/2021  . Multiple Myeloma Panel (SPEP&IFE w/QIG)    Standing Status:   Future    Number of Occurrences:   1    Standing Expiration Date:   06/12/2021  . Kappa/lambda light chains    Standing Status:   Future    Number of Occurrences:   1    Standing Expiration Date:   06/12/2021  . 24-Hr Ur UPEP/UIFE/Light Chains/TP    Standing Status:   Future    Standing Expiration Date:   06/12/2021  . Beta 2 microglobulin    Standing Status:   Future    Number of Occurrences:   1    Standing Expiration Date:   06/12/2021    All questions were answered. The patient knows to call the clinic with any problems, questions or concerns.  A total of more than 60 minutes were spent on this encounter and over half of that time was spent on counseling and coordination of care as outlined above.   Ledell Peoples, MD Department of Hematology/Oncology [facility] Phone: [phone] Pager: [phone] Email: [email or web address]  06/14/2020 4:50 PM

## 2020-06-14 ENCOUNTER — Encounter: Payer: Self-pay | Admitting: Hematology and Oncology

## 2020-06-15 LAB — KAPPA/LAMBDA LIGHT CHAINS
Kappa free light chain: 36.8 mg/L — ABNORMAL HIGH (ref 3.3–19.4)
Kappa, lambda light chain ratio: 2.41 — ABNORMAL HIGH (ref 0.26–1.65)
Lambda free light chains: 15.3 mg/L (ref 5.7–26.3)

## 2020-06-15 LAB — BETA 2 MICROGLOBULIN, SERUM: Beta-2 Microglobulin: 1.7 mg/L (ref 0.6–2.4)

## 2020-06-16 ENCOUNTER — Encounter: Payer: Self-pay | Admitting: General Practice

## 2020-06-16 LAB — MULTIPLE MYELOMA PANEL, SERUM
Albumin SerPl Elph-Mcnc: 3.6 g/dL (ref 2.9–4.4)
Albumin/Glob SerPl: 1.1 (ref 0.7–1.7)
Alpha 1: 0.3 g/dL (ref 0.0–0.4)
Alpha2 Glob SerPl Elph-Mcnc: 0.8 g/dL (ref 0.4–1.0)
B-Globulin SerPl Elph-Mcnc: 1 g/dL (ref 0.7–1.3)
Gamma Glob SerPl Elph-Mcnc: 1.4 g/dL (ref 0.4–1.8)
Globulin, Total: 3.5 g/dL (ref 2.2–3.9)
IgA: 230 mg/dL (ref 61–437)
IgG (Immunoglobin G), Serum: 1225 mg/dL (ref 603–1613)
IgM (Immunoglobulin M), Srm: 99 mg/dL (ref 20–172)
Total Protein ELP: 7.1 g/dL (ref 6.0–8.5)

## 2020-06-18 ENCOUNTER — Telehealth: Payer: Self-pay | Admitting: Hematology and Oncology

## 2020-06-18 NOTE — Telephone Encounter (Signed)
Per 1/21 los, no changes made to pt schedule

## 2020-06-19 ENCOUNTER — Encounter (HOSPITAL_COMMUNITY): Payer: BC Managed Care – PPO | Admitting: Cardiology

## 2020-06-24 ENCOUNTER — Ambulatory Visit (HOSPITAL_COMMUNITY)
Admission: RE | Admit: 2020-06-24 | Discharge: 2020-06-24 | Disposition: A | Discharge status: Home / Self Care | Payer: BC Managed Care – PPO | Source: Ambulatory Visit | Attending: Hematology and Oncology | Admitting: Hematology and Oncology

## 2020-06-24 ENCOUNTER — Other Ambulatory Visit: Payer: Self-pay

## 2020-06-24 DIAGNOSIS — R768 Other specified abnormal immunological findings in serum: Secondary | ICD-10-CM

## 2020-06-24 DIAGNOSIS — D472 Monoclonal gammopathy: Secondary | ICD-10-CM | POA: Diagnosis not present

## 2020-07-03 ENCOUNTER — Telehealth: Payer: Self-pay | Admitting: *Deleted

## 2020-07-03 NOTE — Telephone Encounter (Signed)
TCT patient regarding recent lab results.  Spoke with patient and advised that his lab results do not support AL amyloidosis and therefore we do not need to follow him here.  Advised it still could be Cardiac TTR amyloidosis that would be followed by his cardiologist. Pt voiced understanding

## 2020-07-03 NOTE — Telephone Encounter (Signed)
-----   Message from Orson Slick, MD sent at 06/28/2020  5:05 PM EST ----- Please let Mr. Pevehouse know that we did not find any evidence that his congestive heart failure is being caused by AL amyloidosis (though he may still have the cardiac TTR amyloidosis). We do not need to routinely follow him up here. If there were to be new evidence that AL amyloidosis is causing his heart problems we would be happy to see him back.   ----- Message ----- From: Buel Ream, [facility] [place] Sent: 06/12/2020   2:46 PM EST To: Orson Slick, MD

## 2020-07-23 ENCOUNTER — Telehealth: Payer: Self-pay | Admitting: *Deleted

## 2020-07-23 NOTE — Telephone Encounter (Signed)
Attempt to call patient to schedule sooner follow up with Dr. Rubin Payor

## 2020-08-05 NOTE — Telephone Encounter (Signed)
Spoke to patient, appt scheduled 3/25 with Dr. Gardiner Rhyme

## 2020-08-06 NOTE — Telephone Encounter (Signed)
Awesome, thanks.

## 2020-08-14 ENCOUNTER — Ambulatory Visit: Payer: BC Managed Care – PPO | Admitting: Cardiology

## 2020-08-14 ENCOUNTER — Other Ambulatory Visit: Payer: Self-pay

## 2020-08-14 ENCOUNTER — Encounter: Payer: Self-pay | Admitting: Cardiology

## 2020-08-14 VITALS — BP 153/90 | HR 58 | Ht 72.0 in | Wt 218.2 lb

## 2020-08-14 DIAGNOSIS — I77819 Aortic ectasia, unspecified site: Secondary | ICD-10-CM

## 2020-08-14 DIAGNOSIS — I43 Cardiomyopathy in diseases classified elsewhere: Secondary | ICD-10-CM | POA: Diagnosis not present

## 2020-08-14 DIAGNOSIS — I10 Essential (primary) hypertension: Secondary | ICD-10-CM

## 2020-08-14 DIAGNOSIS — I2510 Atherosclerotic heart disease of native coronary artery without angina pectoris: Secondary | ICD-10-CM

## 2020-08-14 DIAGNOSIS — I5042 Chronic combined systolic (congestive) and diastolic (congestive) heart failure: Secondary | ICD-10-CM

## 2020-08-14 DIAGNOSIS — E854 Organ-limited amyloidosis: Secondary | ICD-10-CM | POA: Diagnosis not present

## 2020-08-14 NOTE — Progress Notes (Signed)
Cardiology Office Note:    Date:  08/15/2020   ID:  Andrew Vaughn, DOB 1955/12/29, MRN [id number]  PCP:  [name], MD  Cardiologist:  Donato Heinz, MD  Electrophysiologist:  None   Referring MD: [name], MD    Chief complaint: heart failure  History of Present Illness:    Andrew Vaughn is a 65 y.o. male with a hx of hypertension and glaucoma who presents for follow-up.  He was admitted to [facility] from 01/20/2022 to 01/24/2019.  He had presented with shortness of breath for the few months prior to admission.  In addition, he has been having intermittent fevers throughout the month of July, for which he completed a 9-day course of doxycycline.  Fevers resolved but he continued to have shortness of breath prompting his ED visit.  TTE on 01/22/2020 showed EF 40 to 45%, mild LVH, grade 3 diastolic dysfunction.  He was also found to have a PE.  Coronary CT showed nonobstructive CAD.  He was started on Entresto and carvedilol.  Repeat TTE on 05/01/2019 showed normalization of LV function, but severe LVH and grade 3 diastolic dysfunction.  PYP scan suggests cardiac amyloidosis.  Since last clinic visit, he reports that he is doing well.  Denies any chest pain, dyspnea, lightheadedness, syncope, lower extremity edema, or palpitations.  He has been exercising, denies any exertional symptoms.   Wt Readings from Last 3 Encounters:  08/14/20 218 lb 3.2 oz (99 kg)  06/12/20 215 lb 4.8 oz (97.7 kg)  03/10/20 220 lb (99.8 kg)      Past Medical History:  Diagnosis Date  . Glaucoma    per Dr. Ellie Lunch  . Hypertension     Past Surgical History:  Procedure Laterality Date  . dislocated left shoulder  1993  . right knee giant cell bone tumor  1998   per Dr. Leonides Schanz at [facility]  . ruptured lumbar disc  1976  . torn meniscus left knee  8/08   Dr. French Ana    Current Medications: Current Meds  Medication Sig  . Ascorbic Acid (VITAMIN C) 1000 MG tablet Take 1,000 mg by  mouth daily.  Marland Kitchen aspirin EC 81 MG tablet Take 1 tablet (81 mg total) by mouth daily. Swallow whole.  Marland Kitchen atorvastatin (LIPITOR) 40 MG tablet TAKE 1 TABLET(40 MG) BY MOUTH DAILY AT 6 PM  . carvedilol (COREG) 12.5 MG tablet Take 1 tablet (12.5 mg total) by mouth 2 (two) times daily with a meal. Pt must keep upcoming appt in Oct with provider for further refills  . furosemide (LASIX) 40 MG tablet TAKE 1 TABLET(40 MG) BY MOUTH DAILY  . Multiple Vitamin (MULTIVITAMIN) capsule Take 1 capsule by mouth daily.  . sacubitril-valsartan (ENTRESTO) 49-51 MG Take 1 tablet by mouth 2 (two) times daily. Pt must keep upcoming appt in Oct with provider for further refills  . travoprost, benzalkonium, (TRAVATAN) 0.004 % ophthalmic solution Place 1 drop into both eyes daily.     Allergies:   Shrimp [shellfish allergy] and Erythromycin   Social History   Socioeconomic History  . Marital status: Married    Spouse name: Not on file  . Number of children: 3  . Years of education: Not on file  . Highest education level: Not on file  Occupational History  . Not on file  Tobacco Use  . Smoking status: Never Smoker  . Smokeless tobacco: Never Used  Substance and Sexual Activity  . Alcohol use: Yes  Alcohol/week: 0.0 standard drinks    Comment: occ  . Drug use: No  . Sexual activity: Not on file  Other Topics Concern  . Not on file  Social History Narrative  . Not on file   Social Determinants of Health   Financial Resource Strain: Not on file  Food Insecurity: Not on file  Transportation Needs: Not on file  Physical Activity: Not on file  Stress: Not on file  Social Connections: Not on file     Family History: The patient's family history includes Atrial fibrillation in his father; CAD in his father; Dementia in his mother; Glaucoma in an other family member; Heart disease in an other family member; Heart failure in his father; Hypertension in an other family member; Hypothyroidism in his sister;  Stroke in an other family member.  ROS:   Please see the history of present illness.     All other systems reviewed and are negative.  EKGs/Labs/Other Studies Reviewed:    The following studies were reviewed today:   EKG:  EKG is ordered today and shows sinus rhythm, rate 58, right axis deviation, Q waves in V1-3, I/aVL, T wave inversions in inferior leads  CTA chest 01/21/19: 1. Very tiny nonocclusive pulmonary embolus involving the right lower lobe as detailed above. 2. Overall findings most concerning for congestive heart failure including moderate-sized bilateral pleural effusions. 3. Mediastinal adenopathy, presumably reactive. 4. There is a 1 cm rounded nodular opacity involving the right lower lobe as detailed above. This may represent a focus of atelectasis, infiltrate, or pulmonary nodule. A three-month follow-up CT of the chest is recommended to confirm resolution of this finding. 5. Dilated ascending aorta measuring 4.2 cm. Recommend annual imaging followup by CTA or MRA. This recommendation follows 2010 ACCF/AHA/AATS/ACR/ASA/SCA/SCAI/SIR/STS/SVM Guidelines for the Diagnosis and Management of Patients with Thoracic Aortic Disease. Circulation. 2010; 121: Z993-T701. Aortic aneurysm NOS (ICD10-I71.9)  TTE 01/22/19:  1. The left ventricle has mild-moderately reduced systolic function, with an ejection fraction of 40-45%. The cavity size was normal. There is mild concentric left ventricular hypertrophy. Left ventricular diastolic Doppler parameters are consistent  with restrictive filling. Left ventrical global hypokinesis without regional wall motion abnormalities.  2. The right ventricle has mildly reduced systolic function. The cavity was mildly enlarged. There is no increase in right ventricular wall thickness. Right ventricular systolic pressure is mildly elevated with an estimated pressure of 41.0 mmHg.  3. Left atrial size was mildly dilated.  4. Right atrial size was  mildly dilated.  5. Mitral valve regurgitation is mild to moderate by color flow Doppler. The MR jet is centrally-directed.  6. The aorta is normal unless otherwise noted.  7. The inferior vena cava was dilated in size with <50% respiratory variability.  Coronary CTA 01/23/19: 1. Coronary calcium score of 0. This was 0 percentile for age and sex matched control. 2. Normal coronary origin with right dominance. 3. Nonobstructive CAD. There is noncalcified plaque in the proximal RCA causing ~50% stenosis. CTFFR across this lesion is 0.88, suggesting lesion is not functionally significant. CAD-RADS 3. Moderate stenosis. Consider symptom-guided anti-ischemic pharmacotherapy as well as risk factor modification per guideline directed care. Additional analysis with CT FFR will be submitted.   TTE 05/01/19:  1. In the setting of severe concentric left ventricular hypertrophy, pattern of restrictive diastolic filling, and increased RV wall thickeness, cannot exclude a diagnosis of cardiac amyloidosis or other infiltrative process.  2. Left ventricular ejection fraction, by 3D calculation, is 57%. The left ventricle  has normal function. There is severely increased left ventricular hypertrophy.  3. Left ventricular diastolic parameters are consistent with Grade III diastolic dysfunction (restrictive).  4. Global right ventricle has normal systolic function.The right ventricular size is normal. Moderately increased right ventricular wall thickness.  5. Left atrial size was moderately dilated.  6. Right atrial size was mildly dilated.  7. The mitral valve is grossly normal. No evidence of mitral valve regurgitation. Mild mitral stenosis.  8. The tricuspid valve is normal in structure. Tricuspid valve regurgitation is trivial.  9. The aortic valve is tricuspid. Aortic valve regurgitation is trivial. No evidence of aortic valve sclerosis or stenosis. 10. The pulmonic valve was normal in structure. Pulmonic  valve regurgitation is trivial. 11. Normal pulmonary artery systolic pressure. 12. The inferior vena cava is normal in size with <50% respiratory variability, suggesting right atrial pressure of 8 mmHg. 13. The left ventricle has no regional wall motion abnormalities.  Recent Labs: 02/26/2020: Magnesium 2.1 06/12/2020: ALT 20; BUN 17; Creatinine 1.03; Hemoglobin 12.8; Platelet Count 206; Potassium 4.2; Sodium 140  Recent Lipid Panel    Component Value Date/Time   CHOL 90 (L) 02/26/2020 1119   TRIG 50 02/26/2020 1119   HDL 38 (L) 02/26/2020 1119   CHOLHDL 2.4 02/26/2020 1119   CHOLHDL 4.5 01/22/2019 0307   VLDL 9 01/22/2019 0307   LDLCALC 39 02/26/2020 1119    Physical Exam:    VS:  BP (!) 153/90   Pulse (!) 58   Ht 6' (1.829 m)   Wt 218 lb 3.2 oz (99 kg)   SpO2 99%   BMI 29.59 kg/m     Wt Readings from Last 3 Encounters:  08/14/20 218 lb 3.2 oz (99 kg)  06/12/20 215 lb 4.8 oz (97.7 kg)  03/10/20 220 lb (99.8 kg)     GEN: Well nourished, well developed in no acute distress HEENT: Normal NECK: No JVD CARDIAC: RRR, no murmurs, rubs, gallops RESPIRATORY:  Clear to auscultation without rales, wheezing or rhonchi  ABDOMEN: Soft, non-tender, non-distended MUSCULOSKELETAL:  Trace LE edema SKIN: Warm and dry NEUROLOGIC:  Alert and oriented x 3 PSYCHIATRIC:  Normal affect   ASSESSMENT:    1. Cardiac amyloidosis ([place])   2. Chronic combined systolic and diastolic heart failure ([place])   3. Coronary artery disease involving native coronary artery of native heart without angina pectoris   4. Essential hypertension   5. Dilatation of aorta ([facility])    PLAN:     Cardiac amyloidosis: severe concentric hypertrophy and grade 3 diastolic dysfunction noted on echo. Unable to get MRI due to claustrophobia.  PYP scan on 03/10/2020 strongly suggestive of amyloidosis.  SPEP/UPEP unremarkable but abnormal light chains (kappa/lambda ratio 1.8).  Referred to hematology, work-up unremarkable,  not suggestive of AL amyloidosis.  Findings consistent with ATTR amyloidosis.  Genetic testing was negative. -Start tafamidis 61 mg daily  Chronic combined systolic and diastolic heart failure: Echo 01/22/09 with EF 40-45%, G3DD, diffuse LV hypokinesis without RWMA, mild concentric LV hypertrophy, mildly reduced RV systolic dysfunction, and mild-moderate MR. Repeat Echo 05/01/19 showed normalization of LV systolic function, but severe concentric LVH.  Coronary CTA with nonobstructive CAD.    PYP scan consistent with amyloidosis as above - Lasix 40 mg daily.  Appears euvolemic.   - Continue Entresto 49-51 mg.   -Continue carvedilol 12.5 mg BID -Starting tafamidis for cardiac amyloidosis as above  Nonobstructive CAD: calcium score 0, but noncalcified plaques causing ~50% RCA stenosis (CTFFR 0.88, suggesting lesion  is not functionally significant) - Atorvastatin 40 mg daily - Aspirin 81 mg daily  Hypertension:Appears controlled -Continue Entresto and carvedilol  HLD:LDL 37 on 05/06/19, on atorvastatin 40 mg daily  Thoracic aortic dilatationnoted to have a 4.2cm dilation of the ascending aorta.  Repeat CTA chest on 03/09/2020 showed 3.9 cm ascending aorta.  Acute pulmonary embolism/left lower extremity DVT: Diagnosed during hospitalization on 01/21/2019.  On Eliquis 5 mg twice daily.  PE thought to be provoked, due to sedentary lifestyle (working long hours as Optometrist, sitting at desk all day).  Completed 85-month course of anticoagulation, have discontinued  Pulmonary nodule: 1 cm rounded nodular opacity involving the right lower lobe noted on CT chest 01/21/2019, which could represent atelectasis, infiltrate, or pulmonary nodule.  Follow-up CT on 05/14/2019 showed resolution of nodule  RTC in 6 months   Medication Adjustments/Labs and Tests Ordered: Current medicines are reviewed at length with the patient today.  Concerns regarding medicines are outlined above.  No orders of the  defined types were placed in this encounter.  No orders of the defined types were placed in this encounter.   Patient Instructions  Medication Instructions:  START tafamadis (Vyndamax) 61 mg daily --our pharmacist with be in touch regarding this medication once paperwork is processed  *If you need a refill on your cardiac medications before your next appointment, please call your pharmacy*  Follow-Up: At [facility], you and your health needs are our priority.  As part of our continuing mission to provide you with exceptional heart care, we have created designated Provider Care Teams.  These Care Teams include your primary Cardiologist (physician) and Advanced Practice Providers (APPs -  Physician Assistants and Nurse Practitioners) who all work together to provide you with the care you need, when you need it.  We recommend signing up for the patient portal called "MyChart".  Sign up information is provided on this After Visit Summary.  MyChart is used to connect with patients for Virtual Visits (Telemedicine).  Patients are able to view lab/test results, encounter notes, upcoming appointments, etc.  Non-urgent messages can be sent to your provider as well.   To learn more about what you can do with MyChart, go to [email or web address].    Your next appointment:   6/14 at 3:40 pm with Dr. Gardiner Rhyme     Signed, Donato Heinz, MD  08/15/2020 6:11 PM    [place]

## 2020-08-14 NOTE — Patient Instructions (Addendum)
Medication Instructions:  START tafamadis (Vyndamax) 61 mg daily --our pharmacist with be in touch regarding this medication once paperwork is processed  *If you need a refill on your cardiac medications before your next appointment, please call your pharmacy*  Follow-Up: At [facility], you and your health needs are our priority.  As part of our continuing mission to provide you with exceptional heart care, we have created designated Provider Care Teams.  These Care Teams include your primary Cardiologist (physician) and Advanced Practice Providers (APPs -  Physician Assistants and Nurse Practitioners) who all work together to provide you with the care you need, when you need it.  We recommend signing up for the patient portal called "MyChart".  Sign up information is provided on this After Visit Summary.  MyChart is used to connect with patients for Virtual Visits (Telemedicine).  Patients are able to view lab/test results, encounter notes, upcoming appointments, etc.  Non-urgent messages can be sent to your provider as well.   To learn more about what you can do with MyChart, go to [email or web address].    Your next appointment:   6/14 at 3:40 pm with Dr. Gardiner Rhyme

## 2020-09-24 ENCOUNTER — Ambulatory Visit: Payer: BC Managed Care – PPO | Admitting: Cardiology

## 2020-09-27 ENCOUNTER — Other Ambulatory Visit: Payer: Self-pay | Admitting: Cardiology

## 2020-09-28 NOTE — Telephone Encounter (Signed)
This is Dr. Schumann's pt 

## 2020-10-17 ENCOUNTER — Other Ambulatory Visit: Payer: Self-pay | Admitting: Cardiology

## 2020-11-01 NOTE — Progress Notes (Signed)
Cardiology Office Note:    Date:  11/03/2020   ID:  [name], DOB July 11, 1955, MRN [id number]  PCP:  [name], MD  Cardiologist:  Donato Heinz, MD  Electrophysiologist:  None   Referring MD: [name], MD    Chief complaint: heart failure  History of Present Illness:    Andrew Vaughn is a 65 y.o. male with a hx of hypertension and glaucoma who presents for follow-up.  He was admitted to [facility] from 01/20/2022 to 01/24/2019.  He had presented with shortness of breath for the few months prior to admission.  In addition, he has been having intermittent fevers throughout the month of July, for which he completed a 9-day course of doxycycline.  Fevers resolved but he continued to have shortness of breath prompting his ED visit.  TTE on 01/22/2020 showed EF 40 to 45%, mild LVH, grade 3 diastolic dysfunction.  He was also found to have a PE.  Coronary CT showed nonobstructive CAD.  He was started on Entresto and carvedilol.  Repeat TTE on 05/01/2019 showed normalization of LV function, but severe LVH and grade 3 diastolic dysfunction.  PYP scan suggests cardiac amyloidosis.  Since last clinic visit, he reports he has been doing well.  Has been on tafamadis for 2-3 weeks.  Denies any chest pain, dyspnea, lightheadedness, syncope, lower extremity edema, or palpitations.  Walks daily for at least 20-45 minutes.  Reports BP has been in 130s when checlks at home.      Wt Readings from Last 3 Encounters:  08/14/20 218 lb 3.2 oz (99 kg)  06/12/20 215 lb 4.8 oz (97.7 kg)  03/10/20 220 lb (99.8 kg)      Past Medical History:  Diagnosis Date   Glaucoma    per Dr. Ellie Lunch   Hypertension     Past Surgical History:  Procedure Laterality Date   dislocated left shoulder  1993   right knee giant cell bone tumor  1998   per Dr. Leonides Schanz at [facility]   ruptured lumbar disc  1976   torn meniscus left knee  8/08   Dr. French Ana    Current Medications: Current Meds   Medication Sig   Ascorbic Acid (VITAMIN C) 1000 MG tablet Take 1,000 mg by mouth daily.   aspirin EC 81 MG tablet Take 1 tablet (81 mg total) by mouth daily. Swallow whole.   atorvastatin (LIPITOR) 40 MG tablet TAKE 1 TABLET(40 MG) BY MOUTH DAILY AT 6 PM   carvedilol (COREG) 12.5 MG tablet Take 1 tablet (12.5 mg total) by mouth 2 (two) times daily with a meal. Pt must keep upcoming appt in Oct with provider for further refills   ENTRESTO 49-51 MG TAKE 1 TABLET BY MOUTH TWICE DAILY   furosemide (LASIX) 40 MG tablet TAKE 1 TABLET(40 MG) BY MOUTH DAILY   Multiple Vitamin (MULTIVITAMIN) capsule Take 1 capsule by mouth daily.   travoprost, benzalkonium, (TRAVATAN) 0.004 % ophthalmic solution Place 1 drop into both eyes daily.   VYNDAMAX 61 MG CAPS TAKE 1 CAPSULE ONCE DAILY     Allergies:   Shrimp [shellfish allergy] and Erythromycin   Social History   Socioeconomic History   Marital status: Married    Spouse name: Not on file   Number of children: 3   Years of education: Not on file   Highest education level: Not on file  Occupational History   Not on file  Tobacco Use   Smoking status: Never  Smokeless tobacco: Never  Substance and Sexual Activity   Alcohol use: Yes    Alcohol/week: 0.0 standard drinks    Comment: occ   Drug use: No   Sexual activity: Not on file  Other Topics Concern   Not on file  Social History Narrative   Not on file   Social Determinants of Health   Financial Resource Strain: Not on file  Food Insecurity: Not on file  Transportation Needs: Not on file  Physical Activity: Not on file  Stress: Not on file  Social Connections: Not on file     Family History: The patient's family history includes Atrial fibrillation in his father; CAD in his father; Dementia in his mother; Glaucoma in an other family member; Heart disease in an other family member; Heart failure in his father; Hypertension in an other family member; Hypothyroidism in his sister;  Stroke in an other family member.  ROS:   Please see the history of present illness.     All other systems reviewed and are negative.  EKGs/Labs/Other Studies Reviewed:    The following studies were reviewed today:   EKG:  EKG is ordered today and shows sinus rhythm, rate 62, right axis deviation, Q waves in V1-3, I/aVL, T wave inversions in inferior leads  CTA chest 01/21/19: 1. Very tiny nonocclusive pulmonary embolus involving the right lower lobe as detailed above. 2. Overall findings most concerning for congestive heart failure including moderate-sized bilateral pleural effusions. 3. Mediastinal adenopathy, presumably reactive. 4. There is a 1 cm rounded nodular opacity involving the right lower lobe as detailed above. This may represent a focus of atelectasis, infiltrate, or pulmonary nodule. A three-month follow-up CT of the chest is recommended to confirm resolution of this finding. 5. Dilated ascending aorta measuring 4.2 cm. Recommend annual imaging followup by CTA or MRA. This recommendation follows 2010 ACCF/AHA/AATS/ACR/ASA/SCA/SCAI/SIR/STS/SVM Guidelines for the Diagnosis and Management of Patients with Thoracic Aortic Disease. Circulation. 2010; 121: Y637-C588. Aortic aneurysm NOS (ICD10-I71.9)  TTE 01/22/19:  1. The left ventricle has mild-moderately reduced systolic function, with an ejection fraction of 40-45%. The cavity size was normal. There is mild concentric left ventricular hypertrophy. Left ventricular diastolic Doppler parameters are consistent  with restrictive filling. Left ventrical global hypokinesis without regional wall motion abnormalities.  2. The right ventricle has mildly reduced systolic function. The cavity was mildly enlarged. There is no increase in right ventricular wall thickness. Right ventricular systolic pressure is mildly elevated with an estimated pressure of 41.0 mmHg.  3. Left atrial size was mildly dilated.  4. Right atrial size was  mildly dilated.  5. Mitral valve regurgitation is mild to moderate by color flow Doppler. The MR jet is centrally-directed.  6. The aorta is normal unless otherwise noted.  7. The inferior vena cava was dilated in size with <50% respiratory variability.  Coronary CTA 01/23/19: 1. Coronary calcium score of 0. This was 0 percentile for age and sex matched control. 2. Normal coronary origin with right dominance. 3. Nonobstructive CAD. There is noncalcified plaque in the proximal RCA causing ~50% stenosis. CTFFR across this lesion is 0.88, suggesting lesion is not functionally significant. CAD-RADS 3. Moderate stenosis. Consider symptom-guided anti-ischemic pharmacotherapy as well as risk factor modification per guideline directed care. Additional analysis with CT FFR will be submitted.   TTE 05/01/19:  1. In the setting of severe concentric left ventricular hypertrophy, pattern of restrictive diastolic filling, and increased RV wall thickeness, cannot exclude a diagnosis of cardiac amyloidosis or other  infiltrative process.  2. Left ventricular ejection fraction, by 3D calculation, is 57%. The left ventricle has normal function. There is severely increased left ventricular hypertrophy.  3. Left ventricular diastolic parameters are consistent with Grade III diastolic dysfunction (restrictive).  4. Global right ventricle has normal systolic function.The right ventricular size is normal. Moderately increased right ventricular wall thickness.  5. Left atrial size was moderately dilated.  6. Right atrial size was mildly dilated.  7. The mitral valve is grossly normal. No evidence of mitral valve regurgitation. Mild mitral stenosis.  8. The tricuspid valve is normal in structure. Tricuspid valve regurgitation is trivial.  9. The aortic valve is tricuspid. Aortic valve regurgitation is trivial. No evidence of aortic valve sclerosis or stenosis. 10. The pulmonic valve was normal in structure. Pulmonic  valve regurgitation is trivial. 11. Normal pulmonary artery systolic pressure. 12. The inferior vena cava is normal in size with <50% respiratory variability, suggesting right atrial pressure of 8 mmHg. 13. The left ventricle has no regional wall motion abnormalities.  Recent Labs: 02/26/2020: Magnesium 2.1 06/12/2020: ALT 20; BUN 17; Creatinine 1.03; Hemoglobin 12.8; Platelet Count 206; Potassium 4.2; Sodium 140  Recent Lipid Panel    Component Value Date/Time   CHOL 90 (L) 02/26/2020 1119   TRIG 50 02/26/2020 1119   HDL 38 (L) 02/26/2020 1119   CHOLHDL 2.4 02/26/2020 1119   CHOLHDL 4.5 01/22/2019 0307   VLDL 9 01/22/2019 0307   LDLCALC 39 02/26/2020 1119    Physical Exam:    VS:  BP (!) 148/83   Pulse 62   Ht 6' (1.829 m)   SpO2 99%   BMI 29.59 kg/m     Wt Readings from Last 3 Encounters:  08/14/20 218 lb 3.2 oz (99 kg)  06/12/20 215 lb 4.8 oz (97.7 kg)  03/10/20 220 lb (99.8 kg)     GEN: Well nourished, well developed in no acute distress HEENT: Normal NECK: No JVD CARDIAC: RRR, no murmurs, rubs, gallops RESPIRATORY:  Clear to auscultation without rales, wheezing or rhonchi  ABDOMEN: Soft, non-tender, non-distended MUSCULOSKELETAL:  Trace LE edema SKIN: Warm and dry NEUROLOGIC:  Alert and oriented x 3 PSYCHIATRIC:  Normal affect   ASSESSMENT:    1. Cardiac amyloidosis ([place])   2. Chronic combined systolic and diastolic heart failure ([place])   3. Coronary artery disease involving native coronary artery of native heart without angina pectoris   4. Dilatation of aorta ([facility])   5. Essential hypertension     PLAN:     Cardiac amyloidosis: severe concentric hypertrophy and grade 3 diastolic dysfunction noted on echo. Unable to get MRI due to claustrophobia.  PYP scan on 03/10/2020 strongly suggestive of amyloidosis.  SPEP/UPEP unremarkable but abnormal light chains (kappa/lambda ratio 1.8).  Referred to hematology, work-up unremarkable, not suggestive of AL  amyloidosis.  Findings consistent with ATTR amyloidosis.  Genetic testing was negative. -Continue tafamidis 61 mg daily  Chronic combined systolic and diastolic heart failure: Echo 01/22/09 with EF 40-45%, G3DD, diffuse LV hypokinesis without RWMA, mild concentric LV hypertrophy, mildly reduced RV systolic dysfunction, and mild-moderate MR.  Repeat Echo 05/01/19 showed normalization of LV systolic function, but severe concentric LVH.  Coronary CTA with nonobstructive CAD.    PYP scan consistent with amyloidosis as above - Lasix 40 mg daily.  Appears euvolemic.  Will check BMET. - Continue Entresto 49-51 mg.   - Continue carvedilol 12.5 mg BID - Started tafamidis for cardiac amyloidosis as above   Nonobstructive CAD: calcium  score 0, but noncalcified plaques causing ~50% RCA stenosis (CTFFR 0.88, suggesting lesion is not functionally significant) - Atorvastatin 40 mg daily - Aspirin 81 mg daily   Hypertension: Appears controlled -Continue Entresto and carvedilol   HLD: LDL 39 on 02/26/20, on atorvastatin 40 mg daily   Thoracic aortic dilatation noted to have a 4.2cm dilation of the ascending aorta.  Repeat CTA chest on 03/09/2020 showed 3.9 cm ascending aorta.  Acute pulmonary embolism/left lower extremity DVT: Diagnosed during hospitalization on 01/21/2019.  On Eliquis 5 mg twice daily.  PE thought to be provoked, due to sedentary lifestyle (working long hours as Optometrist, sitting at desk all day).  Completed 18-month course of anticoagulation, have discontinued  Pulmonary nodule: 1 cm rounded nodular opacity involving the right lower lobe noted on CT chest 01/21/2019, which could represent atelectasis, infiltrate, or pulmonary nodule.  Follow-up CT on 05/14/2019 showed resolution of nodule  RTC in 6 months   Medication Adjustments/Labs and Tests Ordered: Current medicines are reviewed at length with the patient today.  Concerns regarding medicines are outlined above.  Orders Placed This  Encounter  Procedures   Basic metabolic panel   EKG [id number]    No orders of the defined types were placed in this encounter.   Patient Instructions  Medication Instructions:  Your physician recommends that you continue on your current medications as directed. Please refer to the Current Medication list given to you today.  *If you need a refill on your cardiac medications before your next appointment, please call your pharmacy*   Lab Work: BMET today  If you have labs (blood work) drawn today and your tests are completely normal, you will receive your results only by: [place] (if you have MyChart) OR A paper copy in the mail If you have any lab test that is abnormal or we need to change your treatment, we will call you to review the results.  Follow-Up: At [facility], you and your health needs are our priority.  As part of our continuing mission to provide you with exceptional heart care, we have created designated Provider Care Teams.  These Care Teams include your primary Cardiologist (physician) and Advanced Practice Providers (APPs -  Physician Assistants and Nurse Practitioners) who all work together to provide you with the care you need, when you need it.  We recommend signing up for the patient portal called "MyChart".  Sign up information is provided on this After Visit Summary.  MyChart is used to connect with patients for Virtual Visits (Telemedicine).  Patients are able to view lab/test results, encounter notes, upcoming appointments, etc.  Non-urgent messages can be sent to your provider as well.   To learn more about what you can do with MyChart, go to [email or web address].    Your next appointment:   6 month(s)  The format for your next appointment:   In Person  Provider:   Oswaldo Milian, MD      Signed, Donato Heinz, MD  11/03/2020 5:23 PM    [place]

## 2020-11-03 ENCOUNTER — Other Ambulatory Visit: Payer: Self-pay | Admitting: Cardiology

## 2020-11-03 ENCOUNTER — Ambulatory Visit: Payer: BC Managed Care – PPO | Admitting: Cardiology

## 2020-11-03 ENCOUNTER — Encounter: Payer: Self-pay | Admitting: Cardiology

## 2020-11-03 ENCOUNTER — Other Ambulatory Visit: Payer: Self-pay

## 2020-11-03 VITALS — BP 148/83 | HR 62 | Ht 72.0 in

## 2020-11-03 DIAGNOSIS — I5042 Chronic combined systolic (congestive) and diastolic (congestive) heart failure: Secondary | ICD-10-CM

## 2020-11-03 DIAGNOSIS — I2510 Atherosclerotic heart disease of native coronary artery without angina pectoris: Secondary | ICD-10-CM | POA: Diagnosis not present

## 2020-11-03 DIAGNOSIS — I43 Cardiomyopathy in diseases classified elsewhere: Secondary | ICD-10-CM

## 2020-11-03 DIAGNOSIS — I77819 Aortic ectasia, unspecified site: Secondary | ICD-10-CM

## 2020-11-03 DIAGNOSIS — E854 Organ-limited amyloidosis: Secondary | ICD-10-CM | POA: Diagnosis not present

## 2020-11-03 DIAGNOSIS — I10 Essential (primary) hypertension: Secondary | ICD-10-CM

## 2020-11-03 DIAGNOSIS — I5020 Unspecified systolic (congestive) heart failure: Secondary | ICD-10-CM | POA: Diagnosis not present

## 2020-11-03 NOTE — Patient Instructions (Signed)
Medication Instructions:  °Your physician recommends that you continue on your current medications as directed. Please refer to the Current Medication list given to you today. ° °*If you need a refill on your cardiac medications before your next appointment, please call your pharmacy* ° °Lab Work: °BMET today ° °If you have labs (blood work) drawn today and your tests are completely normal, you will receive your results only by: °• MyChart Message (if you have MyChart) OR °• A paper copy in the mail °If you have any lab test that is abnormal or we need to change your treatment, we will call you to review the results. ° °Follow-Up: °At [facility], you and your health needs are our priority.  As part of our continuing mission to provide you with exceptional heart care, we have created designated Provider Care Teams.  These Care Teams include your primary Cardiologist (physician) and Advanced Practice Providers (APPs -  Physician Assistants and Nurse Practitioners) who all work together to provide you with the care you need, when you need it. ° °We recommend signing up for the patient portal called "MyChart".  Sign up information is provided on this After Visit Summary.  MyChart is used to connect with patients for Virtual Visits (Telemedicine).  Patients are able to view lab/test results, encounter notes, upcoming appointments, etc.  Non-urgent messages can be sent to your provider as well.   °To learn more about what you can do with MyChart, go to [email or web address].   ° °Your next appointment:   °6 month(s) ° °The format for your next appointment:   °In Person ° °Provider:   °Christopher Schumann, MD ° ° ° ° °

## 2020-11-04 LAB — BASIC METABOLIC PANEL
BUN/Creatinine Ratio: 15 (ref 10–24)
BUN: 16 mg/dL (ref 8–27)
CO2: 26 mmol/L (ref 20–29)
Calcium: 9.7 mg/dL (ref 8.6–10.2)
Chloride: 99 mmol/L (ref 96–106)
Creatinine, Ser: 1.1 mg/dL (ref 0.76–1.27)
Glucose: 96 mg/dL (ref 65–99)
Potassium: 4.1 mmol/L (ref 3.5–5.2)
Sodium: 138 mmol/L (ref 134–144)
eGFR: 75 mL/min/{1.73_m2} (ref >59)

## 2020-11-25 ENCOUNTER — Other Ambulatory Visit: Payer: Self-pay | Admitting: Cardiology

## 2020-11-26 NOTE — Telephone Encounter (Signed)
This is Dr. Schumann's pt 

## 2021-02-01 DIAGNOSIS — Z85828 Personal history of other malignant neoplasm of skin: Secondary | ICD-10-CM | POA: Diagnosis not present

## 2021-02-01 DIAGNOSIS — L820 Inflamed seborrheic keratosis: Secondary | ICD-10-CM | POA: Diagnosis not present

## 2021-02-01 DIAGNOSIS — H43812 Vitreous degeneration, left eye: Secondary | ICD-10-CM | POA: Diagnosis not present

## 2021-02-01 DIAGNOSIS — L821 Other seborrheic keratosis: Secondary | ICD-10-CM | POA: Diagnosis not present

## 2021-02-01 DIAGNOSIS — L578 Other skin changes due to chronic exposure to nonionizing radiation: Secondary | ICD-10-CM | POA: Diagnosis not present

## 2021-02-01 DIAGNOSIS — D225 Melanocytic nevi of trunk: Secondary | ICD-10-CM | POA: Diagnosis not present

## 2021-03-02 DIAGNOSIS — H43812 Vitreous degeneration, left eye: Secondary | ICD-10-CM | POA: Diagnosis not present

## 2021-03-24 DIAGNOSIS — H401131 Primary open-angle glaucoma, bilateral, mild stage: Secondary | ICD-10-CM | POA: Diagnosis not present

## 2021-03-24 DIAGNOSIS — H5213 Myopia, bilateral: Secondary | ICD-10-CM | POA: Diagnosis not present

## 2021-03-24 DIAGNOSIS — H2511 Age-related nuclear cataract, right eye: Secondary | ICD-10-CM | POA: Diagnosis not present

## 2021-04-03 ENCOUNTER — Other Ambulatory Visit: Payer: Self-pay | Admitting: Cardiology

## 2021-04-21 NOTE — Progress Notes (Signed)
Cardiology Office Note:    Date:  04/22/2021   ID:  Andrew Vaughn, DOB Nov 30, 1955, MRN [id number]  PCP:  [name], MD  Cardiologist:  Donato Heinz, MD  Electrophysiologist:  None   Referring MD: [name], MD    Chief complaint: heart failure  History of Present Illness:    Andrew Vaughn is a 65 y.o. male with a hx of hypertension and glaucoma who presents for follow-up.  He was admitted to [facility] from 01/20/2022 to 01/24/2019.  He had presented with shortness of breath for the few months prior to admission.  In addition, he has been having intermittent fevers throughout the month of July, for which he completed a 9-day course of doxycycline.  Fevers resolved but he continued to have shortness of breath prompting his ED visit.  TTE on 01/22/2020 showed EF 40 to 45%, mild LVH, grade 3 diastolic dysfunction.  He was also found to have a PE.  Coronary CT showed nonobstructive CAD.  He was started on Entresto and carvedilol.  Repeat TTE on 05/01/2019 showed normalization of LV function, but severe LVH and grade 3 diastolic dysfunction.  PYP scan suggests cardiac amyloidosis.  Since last clinic visit, denies any chest pain, dyspnea, lightheadedness, syncope, lower extremity edema, or palpitations.  Walks 45-60 minutes every other day.  Goes hiking on the weekends. Reports BP 130/80s at home.    Wt Readings from Last 3 Encounters:  04/22/21 222 lb (100.7 kg)  08/14/20 218 lb 3.2 oz (99 kg)  06/12/20 215 lb 4.8 oz (97.7 kg)      Past Medical History:  Diagnosis Date   Glaucoma    per Dr. Ellie Lunch   Hypertension     Past Surgical History:  Procedure Laterality Date   dislocated left shoulder  1993   right knee giant cell bone tumor  1998   per Dr. Leonides Schanz at [facility]   ruptured lumbar disc  1976   torn meniscus left knee  8/08   Dr. French Ana    Current Medications: Current Meds  Medication Sig   Ascorbic Acid (VITAMIN C) 1000 MG tablet Take 1,000 mg by  mouth daily.   aspirin EC 81 MG tablet Take 1 tablet (81 mg total) by mouth daily. Swallow whole.   atorvastatin (LIPITOR) 40 MG tablet TAKE 1 TABLET(40 MG) BY MOUTH DAILY AT 6 PM   carvedilol (COREG) 12.5 MG tablet TAKE 1 TABLET(12.5 MG) BY MOUTH TWICE DAILY WITH A MEAL   ENTRESTO 49-51 MG TAKE 1 TABLET BY MOUTH TWICE DAILY   furosemide (LASIX) 40 MG tablet TAKE 1 TABLET(40 MG) BY MOUTH DAILY   Multiple Vitamin (MULTIVITAMIN) capsule Take 1 capsule by mouth daily.   travoprost, benzalkonium, (TRAVATAN) 0.004 % ophthalmic solution Place 1 drop into both eyes daily.   VYNDAMAX 61 MG CAPS TAKE 1 CAPSULE ONCE DAILY     Allergies:   Shrimp [shellfish allergy] and Erythromycin   Social History   Socioeconomic History   Marital status: Married    Spouse name: Not on file   Number of children: 3   Years of education: Not on file   Highest education level: Not on file  Occupational History   Not on file  Tobacco Use   Smoking status: Never   Smokeless tobacco: Never  Substance and Sexual Activity   Alcohol use: Yes    Alcohol/week: 0.0 standard drinks    Comment: occ   Drug use: No   Sexual activity:  Not on file  Other Topics Concern   Not on file  Social History Narrative   Not on file   Social Determinants of Health   Financial Resource Strain: Not on file  Food Insecurity: Not on file  Transportation Needs: Not on file  Physical Activity: Not on file  Stress: Not on file  Social Connections: Not on file     Family History: The patient's family history includes Atrial fibrillation in his father; CAD in his father; Dementia in his mother; Glaucoma in an other family member; Heart disease in an other family member; Heart failure in his father; Hypertension in an other family member; Hypothyroidism in his sister; Stroke in an other family member.  ROS:   Please see the history of present illness.     All other systems reviewed and are negative.  EKGs/Labs/Other Studies  Reviewed:    The following studies were reviewed today:   EKG:   04/22/21: Sinus rhythm, first-degree AV block, rate 67, right axis deviation, Q waves in V1-4, I, aVL, T wave inversions in leads III, aVF  CTA chest 01/21/19: 1. Very tiny nonocclusive pulmonary embolus involving the right lower lobe as detailed above. 2. Overall findings most concerning for congestive heart failure including moderate-sized bilateral pleural effusions. 3. Mediastinal adenopathy, presumably reactive. 4. There is a 1 cm rounded nodular opacity involving the right lower lobe as detailed above. This may represent a focus of atelectasis, infiltrate, or pulmonary nodule. A three-month follow-up CT of the chest is recommended to confirm resolution of this finding. 5. Dilated ascending aorta measuring 4.2 cm. Recommend annual imaging followup by CTA or MRA. This recommendation follows 2010 ACCF/AHA/AATS/ACR/ASA/SCA/SCAI/SIR/STS/SVM Guidelines for the Diagnosis and Management of Patients with Thoracic Aortic Disease. Circulation. 2010; 121: I458-K998. Aortic aneurysm NOS (ICD10-I71.9)  TTE 01/22/19:  1. The left ventricle has mild-moderately reduced systolic function, with an ejection fraction of 40-45%. The cavity size was normal. There is mild concentric left ventricular hypertrophy. Left ventricular diastolic Doppler parameters are consistent  with restrictive filling. Left ventrical global hypokinesis without regional wall motion abnormalities.  2. The right ventricle has mildly reduced systolic function. The cavity was mildly enlarged. There is no increase in right ventricular wall thickness. Right ventricular systolic pressure is mildly elevated with an estimated pressure of 41.0 mmHg.  3. Left atrial size was mildly dilated.  4. Right atrial size was mildly dilated.  5. Mitral valve regurgitation is mild to moderate by color flow Doppler. The MR jet is centrally-directed.  6. The aorta is normal unless  otherwise noted.  7. The inferior vena cava was dilated in size with <50% respiratory variability.  Coronary CTA 01/23/19: 1. Coronary calcium score of 0. This was 0 percentile for age and sex matched control. 2. Normal coronary origin with right dominance. 3. Nonobstructive CAD. There is noncalcified plaque in the proximal RCA causing ~50% stenosis. CTFFR across this lesion is 0.88, suggesting lesion is not functionally significant. CAD-RADS 3. Moderate stenosis. Consider symptom-guided anti-ischemic pharmacotherapy as well as risk factor modification per guideline directed care. Additional analysis with CT FFR will be submitted.   TTE 05/01/19:  1. In the setting of severe concentric left ventricular hypertrophy, pattern of restrictive diastolic filling, and increased RV wall thickeness, cannot exclude a diagnosis of cardiac amyloidosis or other infiltrative process.  2. Left ventricular ejection fraction, by 3D calculation, is 57%. The left ventricle has normal function. There is severely increased left ventricular hypertrophy.  3. Left ventricular diastolic parameters are  consistent with Grade III diastolic dysfunction (restrictive).  4. Global right ventricle has normal systolic function.The right ventricular size is normal. Moderately increased right ventricular wall thickness.  5. Left atrial size was moderately dilated.  6. Right atrial size was mildly dilated.  7. The mitral valve is grossly normal. No evidence of mitral valve regurgitation. Mild mitral stenosis.  8. The tricuspid valve is normal in structure. Tricuspid valve regurgitation is trivial.  9. The aortic valve is tricuspid. Aortic valve regurgitation is trivial. No evidence of aortic valve sclerosis or stenosis. 10. The pulmonic valve was normal in structure. Pulmonic valve regurgitation is trivial. 11. Normal pulmonary artery systolic pressure. 12. The inferior vena cava is normal in size with <50% respiratory  variability, suggesting right atrial pressure of 8 mmHg. 13. The left ventricle has no regional wall motion abnormalities.  Recent Labs: 06/12/2020: ALT 20; Hemoglobin 12.8; Platelet Count 206 11/03/2020: BUN 16; Creatinine, Ser 1.10; Potassium 4.1; Sodium 138  Recent Lipid Panel    Component Value Date/Time   CHOL 90 (L) 02/26/2020 1119   TRIG 50 02/26/2020 1119   HDL 38 (L) 02/26/2020 1119   CHOLHDL 2.4 02/26/2020 1119   CHOLHDL 4.5 01/22/2019 0307   VLDL 9 01/22/2019 0307   LDLCALC 39 02/26/2020 1119    Physical Exam:    VS:  BP 138/82   Pulse 67   Ht 5\' 11"  (1.803 m)   Wt 222 lb (100.7 kg)   SpO2 97%   BMI 30.96 kg/m     Wt Readings from Last 3 Encounters:  04/22/21 222 lb (100.7 kg)  08/14/20 218 lb 3.2 oz (99 kg)  06/12/20 215 lb 4.8 oz (97.7 kg)     GEN: Well nourished, well developed in no acute distress HEENT: Normal NECK: No JVD CARDIAC: RRR, no murmurs, rubs, gallops RESPIRATORY:  Clear to auscultation without rales, wheezing or rhonchi  ABDOMEN: Soft, non-tender, non-distended MUSCULOSKELETAL:  Trace LE edema SKIN: Warm and dry NEUROLOGIC:  Alert and oriented x 3 PSYCHIATRIC:  Normal affect   ASSESSMENT:    1. Cardiac amyloidosis ([place])   2. Chronic combined systolic and diastolic heart failure ([place])   3. Essential hypertension   4. Hyperlipidemia, unspecified hyperlipidemia type      PLAN:     Cardiac amyloidosis: severe concentric hypertrophy and grade 3 diastolic dysfunction noted on echo. Unable to get MRI due to claustrophobia.  PYP scan on 03/10/2020 strongly suggestive of amyloidosis.  SPEP/UPEP unremarkable but abnormal light chains (kappa/lambda ratio 1.8).  Referred to hematology, work-up unremarkable, not suggestive of AL amyloidosis.  Findings consistent with ATTR amyloidosis.  Genetic testing was negative. -Continue tafamidis 61 mg daily -Check echo to monitor for progression of disease  Chronic combined systolic and diastolic heart  failure: Echo 01/23/19 with EF 40-45%, G3DD, diffuse LV hypokinesis without RWMA, mild concentric LV hypertrophy, mildly reduced RV systolic dysfunction, and mild-moderate MR.  Repeat Echo 05/01/19 showed normalization of LV systolic function, but severe concentric LVH.  Coronary CTA with nonobstructive CAD.    PYP scan consistent with amyloidosis as above - Lasix 40 mg daily.  Appears euvolemic.  Will check BMET. - Continue Entresto 49-51 mg.   - Continue carvedilol 12.5 mg BID - Continue tafamidis for cardiac amyloidosis as above - Check echo as above   Nonobstructive CAD: calcium score 0, but noncalcified plaques causing ~50% RCA stenosis (CTFFR 0.88, suggesting lesion is not functionally significant) - Atorvastatin 40 mg daily - Aspirin 81 mg daily  Hypertension: Appears controlled -Continue Entresto and carvedilol   HLD: LDL 39 on 02/26/20, on atorvastatin 40 mg daily   Thoracic aortic dilatation noted to have a 4.2cm dilation of the ascending aorta.  Repeat CTA chest on 03/09/2020 showed 3.9 cm ascending aorta.  Pulmonary embolism/left lower extremity DVT: Diagnosed during hospitalization on 01/21/2019.  On Eliquis 5 mg twice daily.  PE thought to be provoked, due to sedentary lifestyle (working long hours as Optometrist, sitting at desk all day).  Completed 34-month course of anticoagulation, have discontinued  Pulmonary nodule: 1 cm rounded nodular opacity involving the right lower lobe noted on CT chest 01/21/2019, which could represent atelectasis, infiltrate, or pulmonary nodule.  Follow-up CT on 05/14/2019 showed resolution of nodule  RTC in 6 months   Medication Adjustments/Labs and Tests Ordered: Current medicines are reviewed at length with the patient today.  Concerns regarding medicines are outlined above.  Orders Placed This Encounter  Procedures   Comprehensive metabolic panel   CBC   Lipid panel   EKG 12-Lead   ECHOCARDIOGRAM COMPLETE     No orders of the defined  types were placed in this encounter.   Patient Instructions  Medication Instructions:  Your physician recommends that you continue on your current medications as directed. Please refer to the Current Medication list given to you today.  *If you need a refill on your cardiac medications before your next appointment, please call your pharmacy*   Lab Work: CMET, CBC, Lipid today  If you have labs (blood work) drawn today and your tests are completely normal, you will receive your results only by: [place] (if you have MyChart) OR A paper copy in the mail If you have any lab test that is abnormal or we need to change your treatment, we will call you to review the results.   Testing/Procedures: Your physician has requested that you have an echocardiogram. Echocardiography is a painless test that uses sound waves to create images of your heart. It provides your doctor with information about the size and shape of your heart and how well your heart's chambers and valves are working. This procedure takes approximately one hour. There are no restrictions for this procedure.  Follow-Up: At [facility], you and your health needs are our priority.  As part of our continuing mission to provide you with exceptional heart care, we have created designated Provider Care Teams.  These Care Teams include your primary Cardiologist (physician) and Advanced Practice Providers (APPs -  Physician Assistants and Nurse Practitioners) who all work together to provide you with the care you need, when you need it.  We recommend signing up for the patient portal called "MyChart".  Sign up information is provided on this After Visit Summary.  MyChart is used to connect with patients for Virtual Visits (Telemedicine).  Patients are able to view lab/test results, encounter notes, upcoming appointments, etc.  Non-urgent messages can be sent to your provider as well.   To learn more about what you can do with  MyChart, go to [email or web address].    Your next appointment:   6 month(s)  The format for your next appointment:   In Person  Provider:   Donato Heinz, MD        Signed, Donato Heinz, MD  04/22/2021 9:29 PM    [place]

## 2021-04-22 ENCOUNTER — Ambulatory Visit: Payer: BC Managed Care – PPO | Admitting: Cardiology

## 2021-04-22 ENCOUNTER — Other Ambulatory Visit: Payer: Self-pay

## 2021-04-22 ENCOUNTER — Encounter: Payer: Self-pay | Admitting: Cardiology

## 2021-04-22 VITALS — BP 138/82 | HR 67 | Ht 71.0 in | Wt 222.0 lb

## 2021-04-22 DIAGNOSIS — E854 Organ-limited amyloidosis: Secondary | ICD-10-CM | POA: Diagnosis not present

## 2021-04-22 DIAGNOSIS — I43 Cardiomyopathy in diseases classified elsewhere: Secondary | ICD-10-CM | POA: Diagnosis not present

## 2021-04-22 DIAGNOSIS — I5042 Chronic combined systolic (congestive) and diastolic (congestive) heart failure: Secondary | ICD-10-CM | POA: Diagnosis not present

## 2021-04-22 DIAGNOSIS — I10 Essential (primary) hypertension: Secondary | ICD-10-CM

## 2021-04-22 DIAGNOSIS — E785 Hyperlipidemia, unspecified: Secondary | ICD-10-CM | POA: Diagnosis not present

## 2021-04-22 NOTE — Patient Instructions (Signed)
Medication Instructions:  Your physician recommends that you continue on your current medications as directed. Please refer to the Current Medication list given to you today.  *If you need a refill on your cardiac medications before your next appointment, please call your pharmacy*   Lab Work: CMET, CBC, Lipid today  If you have labs (blood work) drawn today and your tests are completely normal, you will receive your results only by: [place] (if you have MyChart) OR A paper copy in the mail If you have any lab test that is abnormal or we need to change your treatment, we will call you to review the results.   Testing/Procedures: Your physician has requested that you have an echocardiogram. Echocardiography is a painless test that uses sound waves to create images of your heart. It provides your doctor with information about the size and shape of your heart and how well your heart's chambers and valves are working. This procedure takes approximately one hour. There are no restrictions for this procedure.  Follow-Up: At [facility], you and your health needs are our priority.  As part of our continuing mission to provide you with exceptional heart care, we have created designated Provider Care Teams.  These Care Teams include your primary Cardiologist (physician) and Advanced Practice Providers (APPs -  Physician Assistants and Nurse Practitioners) who all work together to provide you with the care you need, when you need it.  We recommend signing up for the patient portal called "MyChart".  Sign up information is provided on this After Visit Summary.  MyChart is used to connect with patients for Virtual Visits (Telemedicine).  Patients are able to view lab/test results, encounter notes, upcoming appointments, etc.  Non-urgent messages can be sent to your provider as well.   To learn more about what you can do with MyChart, go to [email or web address].    Your next appointment:    6 month(s)  The format for your next appointment:   In Person  Provider:   Donato Heinz, MD

## 2021-04-23 ENCOUNTER — Encounter: Payer: Self-pay | Admitting: *Deleted

## 2021-04-23 LAB — CBC
Hematocrit: 43.6 % (ref 37.5–51.0)
Hemoglobin: 14.3 g/dL (ref 13.0–17.7)
MCH: 28.4 pg (ref 26.6–33.0)
MCHC: 32.8 g/dL (ref 31.5–35.7)
MCV: 87 fL (ref 79–97)
Platelets: 173 10*3/uL (ref 150–450)
RBC: 5.03 x10E6/uL (ref 4.14–5.80)
RDW: 13 % (ref 11.6–15.4)
WBC: 6.3 10*3/uL (ref 3.4–10.8)

## 2021-04-23 LAB — COMPREHENSIVE METABOLIC PANEL
ALT: 21 IU/L (ref 0–44)
AST: 22 IU/L (ref 0–40)
Albumin/Globulin Ratio: 2 (ref 1.2–2.2)
Albumin: 4.5 g/dL (ref 3.8–4.8)
Alkaline Phosphatase: 121 IU/L (ref 44–121)
BUN/Creatinine Ratio: 17 (ref 10–24)
BUN: 17 mg/dL (ref 8–27)
Bilirubin Total: 1 mg/dL (ref 0.0–1.2)
CO2: 25 mmol/L (ref 20–29)
Calcium: 9.5 mg/dL (ref 8.6–10.2)
Chloride: 104 mmol/L (ref 96–106)
Creatinine, Ser: 0.98 mg/dL (ref 0.76–1.27)
Globulin, Total: 2.3 g/dL (ref 1.5–4.5)
Glucose: 97 mg/dL (ref 70–99)
Potassium: 4.5 mmol/L (ref 3.5–5.2)
Sodium: 141 mmol/L (ref 134–144)
Total Protein: 6.8 g/dL (ref 6.0–8.5)
eGFR: 86 mL/min/{1.73_m2} (ref >59)

## 2021-04-23 LAB — LIPID PANEL
Chol/HDL Ratio: 2.8 ratio (ref 0.0–5.0)
Cholesterol, Total: 101 mg/dL (ref 100–199)
HDL: 36 mg/dL — ABNORMAL LOW (ref >39)
LDL Chol Calc (NIH): 52 mg/dL (ref 0–99)
Triglycerides: 58 mg/dL (ref 0–149)
VLDL Cholesterol Cal: 13 mg/dL (ref 5–40)

## 2021-05-12 ENCOUNTER — Ambulatory Visit (HOSPITAL_COMMUNITY): Payer: BC Managed Care – PPO | Attending: Cardiology

## 2021-05-12 ENCOUNTER — Other Ambulatory Visit: Payer: Self-pay

## 2021-05-12 DIAGNOSIS — E854 Organ-limited amyloidosis: Secondary | ICD-10-CM | POA: Insufficient documentation

## 2021-05-12 DIAGNOSIS — I43 Cardiomyopathy in diseases classified elsewhere: Secondary | ICD-10-CM | POA: Diagnosis not present

## 2021-05-12 DIAGNOSIS — E8589 Other amyloidosis: Secondary | ICD-10-CM

## 2021-05-12 LAB — ECHOCARDIOGRAM COMPLETE: S' Lateral: 3.1 cm

## 2021-05-30 NOTE — Progress Notes (Signed)
Cardiology Office Note:    Date:  06/04/2021   ID:  Andrew Vaughn, DOB 01-18-1956, MRN [id number]  PCP:  [name], MD  Cardiologist:  Donato Heinz, MD  Electrophysiologist:  None   Referring MD: [name], MD    Chief complaint: heart failure  History of Present Illness:    Andrew Vaughn is a 66 y.o. male with a hx of hypertension and glaucoma who presents for follow-up.  He was admitted to [facility] from 01/20/2022 to 01/24/2019.  He had presented with shortness of breath for the few months prior to admission.  In addition, he has been having intermittent fevers throughout the month of July, for which he completed a 9-day course of doxycycline.  Fevers resolved but he continued to have shortness of breath prompting his ED visit.  TTE on 01/22/2020 showed EF 40 to 45%, mild LVH, grade 3 diastolic dysfunction.  He was also found to have a PE.  Coronary CT showed nonobstructive CAD.  He was started on Entresto and carvedilol.  Repeat TTE on 05/01/2019 showed normalization of LV function, but severe LVH and grade 3 diastolic dysfunction.  PYP scan consistent with cardiac amyloidosis.  Echocardiogram on 05/12/2021 showed EF 45 to 50%, severe LVH, normal RV function, severe left atrial dilatation, moderate right atrial dilatation, no significant valvular disease.  Since last clinic visit, he reports he is doing well.  Walking 30 minutes every other day.  Denies any chest pain, dyspnea, lightheadedness, syncope, or palpitations.     Wt Readings from Last 3 Encounters:  06/04/21 226 lb (102.5 kg)  04/22/21 222 lb (100.7 kg)  08/14/20 218 lb 3.2 oz (99 kg)      Past Medical History:  Diagnosis Date   Glaucoma    per Dr. Ellie Lunch   Hypertension     Past Surgical History:  Procedure Laterality Date   dislocated left shoulder  1993   right knee giant cell bone tumor  1998   per Dr. Leonides Schanz at [facility]   ruptured lumbar disc  1976   torn meniscus left knee  8/08    Dr. French Ana    Current Medications: Current Meds  Medication Sig   Ascorbic Acid (VITAMIN C) 1000 MG tablet Take 1,000 mg by mouth daily.   aspirin EC 81 MG tablet Take 1 tablet (81 mg total) by mouth daily. Swallow whole.   atorvastatin (LIPITOR) 40 MG tablet TAKE 1 TABLET(40 MG) BY MOUTH DAILY AT 6 PM   carvedilol (COREG) 12.5 MG tablet TAKE 1 TABLET(12.5 MG) BY MOUTH TWICE DAILY WITH A MEAL   ENTRESTO 49-51 MG TAKE 1 TABLET BY MOUTH TWICE DAILY   furosemide (LASIX) 40 MG tablet TAKE 1 TABLET(40 MG) BY MOUTH DAILY   Multiple Vitamin (MULTIVITAMIN) capsule Take 1 capsule by mouth daily.   travoprost, benzalkonium, (TRAVATAN) 0.004 % ophthalmic solution Place 1 drop into both eyes daily.   VYNDAMAX 61 MG CAPS TAKE 1 CAPSULE ONCE DAILY     Allergies:   Shrimp [shellfish allergy], Erythromycin, and Penicillins   Social History   Socioeconomic History   Marital status: Married    Spouse name: Not on file   Number of children: 3   Years of education: Not on file   Highest education level: Not on file  Occupational History   Not on file  Tobacco Use   Smoking status: Never   Smokeless tobacco: Never  Substance and Sexual Activity   Alcohol use: Yes  Alcohol/week: 0.0 standard drinks    Comment: occ   Drug use: No   Sexual activity: Not on file  Other Topics Concern   Not on file  Social History Narrative   Not on file   Social Determinants of Health   Financial Resource Strain: Not on file  Food Insecurity: Not on file  Transportation Needs: Not on file  Physical Activity: Not on file  Stress: Not on file  Social Connections: Not on file     Family History: The patient's family history includes Atrial fibrillation in his father; CAD in his father; Dementia in his mother; Glaucoma in an other family member; Heart disease in an other family member; Heart failure in his father; Hypertension in an other family member; Hypothyroidism in his sister; Stroke in an other  family member.  ROS:   Please see the history of present illness.     All other systems reviewed and are negative.  EKGs/Labs/Other Studies Reviewed:    The following studies were reviewed today:   EKG:   04/22/21: Sinus rhythm, first-degree AV block, rate 67, right axis deviation, Q waves in V1-4, I, aVL, T wave inversions in leads III, aVF  CTA chest 01/21/19: 1. Very tiny nonocclusive pulmonary embolus involving the right lower lobe as detailed above. 2. Overall findings most concerning for congestive heart failure including moderate-sized bilateral pleural effusions. 3. Mediastinal adenopathy, presumably reactive. 4. There is a 1 cm rounded nodular opacity involving the right lower lobe as detailed above. This may represent a focus of atelectasis, infiltrate, or pulmonary nodule. A three-month follow-up CT of the chest is recommended to confirm resolution of this finding. 5. Dilated ascending aorta measuring 4.2 cm. Recommend annual imaging followup by CTA or MRA. This recommendation follows 2010 ACCF/AHA/AATS/ACR/ASA/SCA/SCAI/SIR/STS/SVM Guidelines for the Diagnosis and Management of Patients with Thoracic Aortic Disease. Circulation. 2010; 121: Y694-W546. Aortic aneurysm NOS (ICD10-I71.9)  TTE 01/22/19:  1. The left ventricle has mild-moderately reduced systolic function, with an ejection fraction of 40-45%. The cavity size was normal. There is mild concentric left ventricular hypertrophy. Left ventricular diastolic Doppler parameters are consistent  with restrictive filling. Left ventrical global hypokinesis without regional wall motion abnormalities.  2. The right ventricle has mildly reduced systolic function. The cavity was mildly enlarged. There is no increase in right ventricular wall thickness. Right ventricular systolic pressure is mildly elevated with an estimated pressure of 41.0 mmHg.  3. Left atrial size was mildly dilated.  4. Right atrial size was mildly dilated.   5. Mitral valve regurgitation is mild to moderate by color flow Doppler. The MR jet is centrally-directed.  6. The aorta is normal unless otherwise noted.  7. The inferior vena cava was dilated in size with <50% respiratory variability.  Coronary CTA 01/23/19: 1. Coronary calcium score of 0. This was 0 percentile for age and sex matched control. 2. Normal coronary origin with right dominance. 3. Nonobstructive CAD. There is noncalcified plaque in the proximal RCA causing ~50% stenosis. CTFFR across this lesion is 0.88, suggesting lesion is not functionally significant. CAD-RADS 3. Moderate stenosis. Consider symptom-guided anti-ischemic pharmacotherapy as well as risk factor modification per guideline directed care. Additional analysis with CT FFR will be submitted.   TTE 05/01/19:  1. In the setting of severe concentric left ventricular hypertrophy, pattern of restrictive diastolic filling, and increased RV wall thickeness, cannot exclude a diagnosis of cardiac amyloidosis or other infiltrative process.  2. Left ventricular ejection fraction, by 3D calculation, is 57%. The left  ventricle has normal function. There is severely increased left ventricular hypertrophy.  3. Left ventricular diastolic parameters are consistent with Grade III diastolic dysfunction (restrictive).  4. Global right ventricle has normal systolic function.The right ventricular size is normal. Moderately increased right ventricular wall thickness.  5. Left atrial size was moderately dilated.  6. Right atrial size was mildly dilated.  7. The mitral valve is grossly normal. No evidence of mitral valve regurgitation. Mild mitral stenosis.  8. The tricuspid valve is normal in structure. Tricuspid valve regurgitation is trivial.  9. The aortic valve is tricuspid. Aortic valve regurgitation is trivial. No evidence of aortic valve sclerosis or stenosis. 10. The pulmonic valve was normal in structure. Pulmonic valve regurgitation  is trivial. 11. Normal pulmonary artery systolic pressure. 12. The inferior vena cava is normal in size with <50% respiratory variability, suggesting right atrial pressure of 8 mmHg. 13. The left ventricle has no regional wall motion abnormalities.  Recent Labs: 04/22/2021: ALT 21; BUN 17; Creatinine, Ser 0.98; Hemoglobin 14.3; Platelets 173; Potassium 4.5; Sodium 141  Recent Lipid Panel    Component Value Date/Time   CHOL 101 04/22/2021 1102   TRIG 58 04/22/2021 1102   HDL 36 (L) 04/22/2021 1102   CHOLHDL 2.8 04/22/2021 1102   CHOLHDL 4.5 01/22/2019 0307   VLDL 9 01/22/2019 0307   LDLCALC 52 04/22/2021 1102    Physical Exam:    VS:  BP 128/72    Pulse 63    Ht 6' (1.829 m)    Wt 226 lb (102.5 kg)    SpO2 98%    BMI 30.65 kg/m     Wt Readings from Last 3 Encounters:  06/04/21 226 lb (102.5 kg)  04/22/21 222 lb (100.7 kg)  08/14/20 218 lb 3.2 oz (99 kg)     GEN: Well nourished, well developed in no acute distress HEENT: Normal NECK: No JVD CARDIAC: RRR, no murmurs, rubs, gallops RESPIRATORY:  Clear to auscultation without rales, wheezing or rhonchi  ABDOMEN: Soft, non-tender, non-distended MUSCULOSKELETAL:  Trace LE edema SKIN: Warm and dry NEUROLOGIC:  Alert and oriented x 3 PSYCHIATRIC:  Normal affect   ASSESSMENT:    1. Cardiac amyloidosis ([place])   2. Chronic combined systolic and diastolic heart failure ([place])   3. Coronary artery disease involving native coronary artery of native heart without angina pectoris   4. Essential hypertension   5. Hyperlipidemia, unspecified hyperlipidemia type     PLAN:     Cardiac amyloidosis: severe concentric hypertrophy and grade 3 diastolic dysfunction noted on echo. Unable to get MRI due to claustrophobia.  PYP scan on 03/10/2020 strongly suggestive of amyloidosis.  SPEP/UPEP unremarkable but abnormal light chains (kappa/lambda ratio 1.8).  Referred to hematology, work-up unremarkable, not suggestive of AL amyloidosis.  Findings  consistent with ATTR amyloidosis.  Genetic testing was negative. -Continue tafamidis 61 mg daily -Refer to Advanced Heart Failure  Chronic combined systolic and diastolic heart failure: Echo 01/23/19 with EF 40-45%, G3DD, diffuse LV hypokinesis without RWMA, mild concentric LV hypertrophy, mildly reduced RV systolic dysfunction, and mild-moderate MR.  Repeat Echo 05/01/19 showed normalization of LV systolic function, but severe concentric LVH.  Coronary CTA with nonobstructive CAD.    PYP scan consistent with amyloidosis as above.  Echocardiogram on 05/12/2021 showed EF 45 to 50%, severe LVH, normal RV function, severe left atrial dilatation, moderate right atrial dilatation, no significant valvular disease. -Continue Lasix 40 mg daily.  Appears euvolemic.   - Continue Entresto 49-51 mg.   -  Continue carvedilol 12.5 mg BID - Continue tafamidis for cardiac amyloidosis as above   Nonobstructive CAD: calcium score 0, but noncalcified plaques causing ~50% RCA stenosis (CTFFR 0.88, suggesting lesion is not functionally significant) - Atorvastatin 40 mg daily - Aspirin 81 mg daily   Hypertension: Appears controlled -Continue Entresto and carvedilol   HLD: LDL 39 on 02/26/20, on atorvastatin 40 mg daily   Thoracic aortic dilatation noted to have a 4.2cm dilation of the ascending aorta.  Repeat CTA chest on 03/09/2020 showed 3.9 cm ascending aorta.  Pulmonary embolism/left lower extremity DVT: Diagnosed during hospitalization on 01/21/2019.  Started on Eliquis 5 mg twice daily.  PE thought to be provoked, due to sedentary lifestyle (working long hours as Optometrist, sitting at desk all day).  Completed 63-month course of anticoagulation, have discontinued  Pulmonary nodule: 1 cm rounded nodular opacity involving the right lower lobe noted on CT chest 01/21/2019, which could represent atelectasis, infiltrate, or pulmonary nodule.  Follow-up CT on 05/14/2019 showed resolution of nodule  RTC in 6  months   Medication Adjustments/Labs and Tests Ordered: Current medicines are reviewed at length with the patient today.  Concerns regarding medicines are outlined above.  Orders Placed This Encounter  Procedures   AMB referral to CHF clinic     No orders of the defined types were placed in this encounter.    Patient Instructions  Medication Instructions:  The current medical regimen is effective;  continue present plan and medications.  *If you need a refill on your cardiac medications before your next appointment, please call your pharmacy*   Follow-Up: At [facility], you and your health needs are our priority.  As part of our continuing mission to provide you with exceptional heart care, we have created designated Provider Care Teams.  These Care Teams include your primary Cardiologist (physician) and Advanced Practice Providers (APPs -  Physician Assistants and Nurse Practitioners) who all work together to provide you with the care you need, when you need it.  We recommend signing up for the patient portal called "MyChart".  Sign up information is provided on this After Visit Summary.  MyChart is used to connect with patients for Virtual Visits (Telemedicine).  Patients are able to view lab/test results, encounter notes, upcoming appointments, etc.  Non-urgent messages can be sent to your provider as well.   To learn more about what you can do with MyChart, go to [email or web address].    Your next appointment:   6 month(s)  The format for your next appointment:   In Person  Provider:   Donato Heinz, MD     Other Instructons Referral to advanced heart failure clinic- they will call you for an appointment.   Signed, Donato Heinz, MD  06/04/2021 12:38 PM    [place]

## 2021-06-04 ENCOUNTER — Ambulatory Visit: Payer: BC Managed Care – PPO | Admitting: Cardiology

## 2021-06-04 ENCOUNTER — Other Ambulatory Visit: Payer: Self-pay

## 2021-06-04 ENCOUNTER — Encounter: Payer: Self-pay | Admitting: Cardiology

## 2021-06-04 VITALS — BP 128/72 | HR 63 | Ht 72.0 in | Wt 226.0 lb

## 2021-06-04 DIAGNOSIS — E854 Organ-limited amyloidosis: Secondary | ICD-10-CM | POA: Diagnosis not present

## 2021-06-04 DIAGNOSIS — I5042 Chronic combined systolic (congestive) and diastolic (congestive) heart failure: Secondary | ICD-10-CM | POA: Diagnosis not present

## 2021-06-04 DIAGNOSIS — E785 Hyperlipidemia, unspecified: Secondary | ICD-10-CM

## 2021-06-04 DIAGNOSIS — I43 Cardiomyopathy in diseases classified elsewhere: Secondary | ICD-10-CM

## 2021-06-04 DIAGNOSIS — I10 Essential (primary) hypertension: Secondary | ICD-10-CM | POA: Diagnosis not present

## 2021-06-04 DIAGNOSIS — I2510 Atherosclerotic heart disease of native coronary artery without angina pectoris: Secondary | ICD-10-CM

## 2021-06-04 NOTE — Patient Instructions (Signed)
Medication Instructions:  The current medical regimen is effective;  continue present plan and medications.  *If you need a refill on your cardiac medications before your next appointment, please call your pharmacy*   Follow-Up: At [facility], you and your health needs are our priority.  As part of our continuing mission to provide you with exceptional heart care, we have created designated Provider Care Teams.  These Care Teams include your primary Cardiologist (physician) and Advanced Practice Providers (APPs -  Physician Assistants and Nurse Practitioners) who all work together to provide you with the care you need, when you need it.  We recommend signing up for the patient portal called "MyChart".  Sign up information is provided on this After Visit Summary.  MyChart is used to connect with patients for Virtual Visits (Telemedicine).  Patients are able to view lab/test results, encounter notes, upcoming appointments, etc.  Non-urgent messages can be sent to your provider as well.   To learn more about what you can do with MyChart, go to [email or web address].    Your next appointment:   6 month(s)  The format for your next appointment:   In Person  Provider:   Donato Heinz, MD     Other Instructons Referral to advanced heart failure clinic- they will call you for an appointment.

## 2021-07-17 ENCOUNTER — Other Ambulatory Visit: Payer: Self-pay | Admitting: Cardiology

## 2021-09-03 ENCOUNTER — Other Ambulatory Visit: Payer: Self-pay | Admitting: Cardiology

## 2021-09-22 DIAGNOSIS — H2511 Age-related nuclear cataract, right eye: Secondary | ICD-10-CM | POA: Diagnosis not present

## 2021-09-22 DIAGNOSIS — H401131 Primary open-angle glaucoma, bilateral, mild stage: Secondary | ICD-10-CM | POA: Diagnosis not present

## 2021-10-27 ENCOUNTER — Other Ambulatory Visit: Payer: Self-pay | Admitting: Cardiology

## 2021-10-31 NOTE — Progress Notes (Unsigned)
Cardiology Office Note:    Date:  11/02/2021   ID:  [name], DOB 04/26/1956, MRN [id number]  PCP:  [name], MD  Cardiologist:  Donato Heinz, MD  Electrophysiologist:  None   Referring MD: [name], MD    Chief complaint: heart failure  History of Present Illness:    Andrew Vaughn is a 66 y.o. male with a hx of hypertension and glaucoma who presents for follow-up.  He was admitted to [facility] from 01/20/2022 to 01/24/2019.  He had presented with shortness of breath for the few months prior to admission.  In addition, he has been having intermittent fevers throughout the month of July, for which he completed a 9-day course of doxycycline.  Fevers resolved but he continued to have shortness of breath prompting his ED visit.  TTE on 01/22/2020 showed EF 40 to 45%, mild LVH, grade 3 diastolic dysfunction.  He was also found to have a PE.  Coronary CT showed nonobstructive CAD.  He was started on Entresto and carvedilol.  Repeat TTE on 05/01/2019 showed normalization of LV function, but severe LVH and grade 3 diastolic dysfunction.  PYP scan consistent with cardiac amyloidosis.  Echocardiogram on 05/12/2021 showed EF 45 to 50%, severe LVH, normal RV function, severe left atrial dilatation, moderate right atrial dilatation, no significant valvular disease.  Since last clinic visit, he reports that he is doing well.  Denies any chest pain, dyspnea, lightheadedness, syncope, lower extremity edema, or palpitations.  Walking at least 30 minutes 4 days/week.  Denies any exertional symptoms.   Wt Readings from Last 3 Encounters:  11/02/21 227 lb (103 kg)  06/04/21 226 lb (102.5 kg)  04/22/21 222 lb (100.7 kg)      Past Medical History:  Diagnosis Date   Glaucoma    per Dr. Ellie Lunch   Hypertension     Past Surgical History:  Procedure Laterality Date   dislocated left shoulder  1993   right knee giant cell bone tumor  1998   per Dr. Leonides Schanz at [facility]    ruptured lumbar disc  1976   torn meniscus left knee  8/08   Dr. French Ana    Current Medications: Current Meds  Medication Sig   Ascorbic Acid (VITAMIN C) 1000 MG tablet Take 1,000 mg by mouth daily.   aspirin EC 81 MG tablet Take 1 tablet (81 mg total) by mouth daily. Swallow whole.   atorvastatin (LIPITOR) 40 MG tablet TAKE 1 TABLET(40 MG) BY MOUTH DAILY AT 6 PM   carvedilol (COREG) 12.5 MG tablet TAKE 1 TABLET(12.5 MG) BY MOUTH TWICE DAILY WITH A MEAL   ENTRESTO 49-51 MG TAKE 1 TABLET BY MOUTH TWICE DAILY   furosemide (LASIX) 40 MG tablet TAKE 1 TABLET(40 MG) BY MOUTH DAILY   Multiple Vitamin (MULTIVITAMIN) capsule Take 1 capsule by mouth daily.   travoprost, benzalkonium, (TRAVATAN) 0.004 % ophthalmic solution Place 1 drop into both eyes daily.   VYNDAMAX 61 MG CAPS TAKE 1 CAPSULE ONCE DAILY     Allergies:   Shrimp [shellfish allergy], Erythromycin, and Penicillins   Social History   Socioeconomic History   Marital status: Married    Spouse name: Not on file   Number of children: 3   Years of education: Not on file   Highest education level: Not on file  Occupational History   Not on file  Tobacco Use   Smoking status: Never   Smokeless tobacco: Never  Substance and Sexual Activity  Alcohol use: Yes    Alcohol/week: 0.0 standard drinks of alcohol    Comment: occ   Drug use: No   Sexual activity: Not on file  Other Topics Concern   Not on file  Social History Narrative   Not on file   Social Determinants of Health   Financial Resource Strain: Not on file  Food Insecurity: Not on file  Transportation Needs: Not on file  Physical Activity: Not on file  Stress: Not on file  Social Connections: Not on file     Family History: The patient's family history includes Atrial fibrillation in his father; CAD in his father; Dementia in his mother; Glaucoma in an other family member; Heart disease in an other family member; Heart failure in his father; Hypertension in an  other family member; Hypothyroidism in his sister; Stroke in an other family member.  ROS:   Please see the history of present illness.     All other systems reviewed and are negative.  EKGs/Labs/Other Studies Reviewed:    The following studies were reviewed today:   EKG:   11/02/2021: Sinus rhythm, first-degree AV block, rate 60, right axis deviation, Q waves in V1-3, I/aVL, T wave inversions in inferior leads 04/22/21: Sinus rhythm, first-degree AV block, rate 67, right axis deviation, Q waves in V1-4, I, aVL, T wave inversions in leads III, aVF  CTA chest 01/21/19: 1. Very tiny nonocclusive pulmonary embolus involving the right lower lobe as detailed above. 2. Overall findings most concerning for congestive heart failure including moderate-sized bilateral pleural effusions. 3. Mediastinal adenopathy, presumably reactive. 4. There is a 1 cm rounded nodular opacity involving the right lower lobe as detailed above. This may represent a focus of atelectasis, infiltrate, or pulmonary nodule. A three-month follow-up CT of the chest is recommended to confirm resolution of this finding. 5. Dilated ascending aorta measuring 4.2 cm. Recommend annual imaging followup by CTA or MRA. This recommendation follows 2010 ACCF/AHA/AATS/ACR/ASA/SCA/SCAI/SIR/STS/SVM Guidelines for the Diagnosis and Management of Patients with Thoracic Aortic Disease. Circulation. 2010; 121: T245-Y099. Aortic aneurysm NOS (ICD10-I71.9)  TTE 01/22/19:  1. The left ventricle has mild-moderately reduced systolic function, with an ejection fraction of 40-45%. The cavity size was normal. There is mild concentric left ventricular hypertrophy. Left ventricular diastolic Doppler parameters are consistent  with restrictive filling. Left ventrical global hypokinesis without regional wall motion abnormalities.  2. The right ventricle has mildly reduced systolic function. The cavity was mildly enlarged. There is no increase in right  ventricular wall thickness. Right ventricular systolic pressure is mildly elevated with an estimated pressure of 41.0 mmHg.  3. Left atrial size was mildly dilated.  4. Right atrial size was mildly dilated.  5. Mitral valve regurgitation is mild to moderate by color flow Doppler. The MR jet is centrally-directed.  6. The aorta is normal unless otherwise noted.  7. The inferior vena cava was dilated in size with <50% respiratory variability.  Coronary CTA 01/23/19: 1. Coronary calcium score of 0. This was 0 percentile for age and sex matched control. 2. Normal coronary origin with right dominance. 3. Nonobstructive CAD. There is noncalcified plaque in the proximal RCA causing ~50% stenosis. CTFFR across this lesion is 0.88, suggesting lesion is not functionally significant. CAD-RADS 3. Moderate stenosis. Consider symptom-guided anti-ischemic pharmacotherapy as well as risk factor modification per guideline directed care. Additional analysis with CT FFR will be submitted.   TTE 05/01/19:  1. In the setting of severe concentric left ventricular hypertrophy, pattern of restrictive diastolic  filling, and increased RV wall thickeness, cannot exclude a diagnosis of cardiac amyloidosis or other infiltrative process.  2. Left ventricular ejection fraction, by 3D calculation, is 57%. The left ventricle has normal function. There is severely increased left ventricular hypertrophy.  3. Left ventricular diastolic parameters are consistent with Grade III diastolic dysfunction (restrictive).  4. Global right ventricle has normal systolic function.The right ventricular size is normal. Moderately increased right ventricular wall thickness.  5. Left atrial size was moderately dilated.  6. Right atrial size was mildly dilated.  7. The mitral valve is grossly normal. No evidence of mitral valve regurgitation. Mild mitral stenosis.  8. The tricuspid valve is normal in structure. Tricuspid valve regurgitation is  trivial.  9. The aortic valve is tricuspid. Aortic valve regurgitation is trivial. No evidence of aortic valve sclerosis or stenosis. 10. The pulmonic valve was normal in structure. Pulmonic valve regurgitation is trivial. 11. Normal pulmonary artery systolic pressure. 12. The inferior vena cava is normal in size with <50% respiratory variability, suggesting right atrial pressure of 8 mmHg. 13. The left ventricle has no regional wall motion abnormalities.  Recent Labs: 04/22/2021: ALT 21; BUN 17; Creatinine, Ser 0.98; Hemoglobin 14.3; Platelets 173; Potassium 4.5; Sodium 141  Recent Lipid Panel    Component Value Date/Time   CHOL 101 04/22/2021 1102   TRIG 58 04/22/2021 1102   HDL 36 (L) 04/22/2021 1102   CHOLHDL 2.8 04/22/2021 1102   CHOLHDL 4.5 01/22/2019 0307   VLDL 9 01/22/2019 0307   LDLCALC 52 04/22/2021 1102    Physical Exam:    VS:  BP 128/80   Pulse 60   Ht '5\' 11"'$  (1.803 m)   Wt 227 lb (103 kg)   SpO2 97%   BMI 31.66 kg/m     Wt Readings from Last 3 Encounters:  11/02/21 227 lb (103 kg)  06/04/21 226 lb (102.5 kg)  04/22/21 222 lb (100.7 kg)     GEN: Well nourished, well developed in no acute distress HEENT: Normal NECK: No JVD CARDIAC: RRR, no murmurs, rubs, gallops RESPIRATORY:  Clear to auscultation without rales, wheezing or rhonchi  ABDOMEN: Soft, non-tender, non-distended MUSCULOSKELETAL:  Trace LE edema SKIN: Warm and dry NEUROLOGIC:  Alert and oriented x 3 PSYCHIATRIC:  Normal affect   ASSESSMENT:    1. Cardiac amyloidosis ([place])   2. Chronic combined systolic and diastolic heart failure ([place])   3. Essential hypertension   4. Coronary artery disease involving native coronary artery of native heart without angina pectoris   5. Hyperlipidemia, unspecified hyperlipidemia type      PLAN:     Cardiac amyloidosis: severe concentric hypertrophy and grade 3 diastolic dysfunction noted on echo. Unable to get MRI due to claustrophobia.  PYP scan on  03/10/2020 strongly suggestive of amyloidosis.  SPEP/UPEP unremarkable but abnormal light chains (kappa/lambda ratio 1.8).  Referred to hematology, work-up unremarkable, not suggestive of AL amyloidosis.  Findings consistent with ATTR amyloidosis.  Genetic testing was negative. -Continue tafamidis 61 mg daily  Chronic combined systolic and diastolic heart failure: Echo 01/23/19 with EF 40-45%, G3DD, diffuse LV hypokinesis without RWMA, mild concentric LV hypertrophy, mildly reduced RV systolic dysfunction, and mild-moderate MR.  Repeat Echo 05/01/19 showed normalization of LV systolic function, but severe concentric LVH.  Coronary CTA with nonobstructive CAD.    PYP scan consistent with amyloidosis as above.  Echocardiogram on 05/12/2021 showed EF 45 to 50%, severe LVH, normal RV function, severe left atrial dilatation, moderate right atrial dilatation, no  significant valvular disease. - Appears euvolemic.  Recommend checking BMET/magnesium.  If stable kidney function/electrolytes, plan to add Farxiga 10 mg daily and spironolactone 12.5 mg daily.  Will plan to switch Lasix to as needed after making this change, would monitor daily weights and take Lasix if gains more than 3 pounds in 1 day or 5 pounds in 1 week - Continue Entresto 49-51 mg.   - Continue carvedilol 12.5 mg BID - Continue tafamidis for cardiac amyloidosis as above   Nonobstructive CAD: CT 01/2019 showed calcium score 0, but noncalcified plaque causing ~50% RCA stenosis (CTFFR 0.88, suggesting lesion is not functionally significant).  Denies any anginal symptoms - Atorvastatin 40 mg daily - Aspirin 81 mg daily   Hypertension: Appears controlled -Continue Entresto and carvedilol   HLD: LDL 52 on 04/22/2021, on atorvastatin 40 mg daily   Thoracic aortic dilatation noted to have a 4.2cm dilation of the ascending aorta.  Repeat CTA chest on 03/09/2020 showed 3.9 cm ascending aorta.  Pulmonary embolism/left lower extremity DVT: Diagnosed  during hospitalization on 01/21/2019.  Started on Eliquis 5 mg twice daily.  PE thought to be provoked, due to sedentary lifestyle (working long hours as Optometrist, sitting at desk all day).  Completed 32-monthcourse of anticoagulation, have discontinued  Pulmonary nodule: 1 cm rounded nodular opacity involving the right lower lobe noted on CT chest 01/21/2019, which could represent atelectasis, infiltrate, or pulmonary nodule.  Follow-up CT on 05/14/2019 showed resolution of nodule  RTC in 3 months   Medication Adjustments/Labs and Tests Ordered: Current medicines are reviewed at length with the patient today.  Concerns regarding medicines are outlined above.  Orders Placed This Encounter  Procedures   Basic metabolic panel   Magnesium   EKG 12-Lead     No orders of the defined types were placed in this encounter.    Patient Instructions  Medication Instructions:  Your physician recommends that you continue on your current medications as directed. Please refer to the Current Medication list given to you today.  *If you need a refill on your cardiac medications before your next appointment, please call your pharmacy*   Lab Work: BMET, [email or web address]  If you have labs (blood work) drawn today and your tests are completely normal, you will receive your results only by: [place](if you have MyChart) OR A paper copy in the mail If you have any lab test that is abnormal or we need to change your treatment, we will call you to review the results.  Follow-Up: [facility] and your health needs are our priority.  As part of our continuing mission to provide you with exceptional heart care, we have created designated Provider Care Teams.  These Care Teams include your primary Cardiologist (physician) and Advanced Practice Providers (APPs -  Physician Assistants and Nurse Practitioners) who all work together to provide you with the care you need, when you need it.  We  recommend signing up for the patient portal called "MyChart".  Sign up information is provided on this After Visit Summary.  MyChart is used to connect with patients for Virtual Visits (Telemedicine).  Patients are able to view lab/test results, encounter notes, upcoming appointments, etc.  Non-urgent messages can be sent to your provider as well.   To learn more about what you can do with MyChart, go to [email or web address]    Your next appointment:   3 month(s)  The format for your next appointment:   In Person  Provider:  Donato Heinz, MD {   Important Information About Sugar         Signed, Donato Heinz, MD  11/02/2021 9:57 AM    [place]

## 2021-11-02 ENCOUNTER — Ambulatory Visit: Payer: BC Managed Care – PPO | Admitting: Cardiology

## 2021-11-02 ENCOUNTER — Encounter: Payer: Self-pay | Admitting: Cardiology

## 2021-11-02 VITALS — BP 128/80 | HR 60 | Ht 71.0 in | Wt 227.0 lb

## 2021-11-02 DIAGNOSIS — E785 Hyperlipidemia, unspecified: Secondary | ICD-10-CM

## 2021-11-02 DIAGNOSIS — I43 Cardiomyopathy in diseases classified elsewhere: Secondary | ICD-10-CM

## 2021-11-02 DIAGNOSIS — I2510 Atherosclerotic heart disease of native coronary artery without angina pectoris: Secondary | ICD-10-CM

## 2021-11-02 DIAGNOSIS — E854 Organ-limited amyloidosis: Secondary | ICD-10-CM

## 2021-11-02 DIAGNOSIS — I10 Essential (primary) hypertension: Secondary | ICD-10-CM

## 2021-11-02 DIAGNOSIS — I5042 Chronic combined systolic (congestive) and diastolic (congestive) heart failure: Secondary | ICD-10-CM | POA: Diagnosis not present

## 2021-11-02 LAB — BASIC METABOLIC PANEL
BUN/Creatinine Ratio: 20 (ref 10–24)
BUN: 21 mg/dL (ref 8–27)
CO2: 24 mmol/L (ref 20–29)
Calcium: 9.9 mg/dL (ref 8.6–10.2)
Chloride: 102 mmol/L (ref 96–106)
Creatinine, Ser: 1.04 mg/dL (ref 0.76–1.27)
Glucose: 91 mg/dL (ref 70–99)
Potassium: 4.2 mmol/L (ref 3.5–5.2)
Sodium: 141 mmol/L (ref 134–144)
eGFR: 80 mL/min/{1.73_m2} (ref >59)

## 2021-11-02 LAB — MAGNESIUM: Magnesium: 2 mg/dL (ref 1.6–2.3)

## 2021-11-02 NOTE — Patient Instructions (Signed)
Medication Instructions:  Your physician recommends that you continue on your current medications as directed. Please refer to the Current Medication list given to you today.  *If you need a refill on your cardiac medications before your next appointment, please call your pharmacy*   Lab Work: BMET, [place] today  If you have labs (blood work) drawn today and your tests are completely normal, you will receive your results only by: [place] (if you have MyChart) OR A paper copy in the mail If you have any lab test that is abnormal or we need to change your treatment, we will call you to review the results.  Follow-Up: At [facility], you and your health needs are our priority.  As part of our continuing mission to provide you with exceptional heart care, we have created designated Provider Care Teams.  These Care Teams include your primary Cardiologist (physician) and Advanced Practice Providers (APPs -  Physician Assistants and Nurse Practitioners) who all work together to provide you with the care you need, when you need it.  We recommend signing up for the patient portal called "MyChart".  Sign up information is provided on this After Visit Summary.  MyChart is used to connect with patients for Virtual Visits (Telemedicine).  Patients are able to view lab/test results, encounter notes, upcoming appointments, etc.  Non-urgent messages can be sent to your provider as well.   To learn more about what you can do with MyChart, go to [email or web address].    Your next appointment:   3 month(s)  The format for your next appointment:   In Person  Provider:   Donato Heinz, MD {   Important Information About Sugar

## 2021-11-04 ENCOUNTER — Other Ambulatory Visit: Payer: Self-pay | Admitting: *Deleted

## 2021-11-04 DIAGNOSIS — E854 Organ-limited amyloidosis: Secondary | ICD-10-CM

## 2021-11-04 DIAGNOSIS — I10 Essential (primary) hypertension: Secondary | ICD-10-CM

## 2021-11-04 DIAGNOSIS — I5042 Chronic combined systolic (congestive) and diastolic (congestive) heart failure: Secondary | ICD-10-CM

## 2021-11-04 MED ORDER — DAPAGLIFLOZIN PROPANEDIOL 10 MG PO TABS: 10.0000 mg | ORAL_TABLET | Freq: Every day | ORAL | 3 refills | Status: DC

## 2021-11-04 MED ORDER — SPIRONOLACTONE 25 MG PO TABS: 12.5000 mg | ORAL_TABLET | Freq: Every day | ORAL | 3 refills | Status: DC

## 2021-11-04 MED ORDER — FUROSEMIDE 40 MG PO TABS: 40.0000 mg | ORAL_TABLET | ORAL | 3 refills | Status: DC | PRN

## 2021-11-26 ENCOUNTER — Other Ambulatory Visit: Payer: Self-pay

## 2021-11-26 DIAGNOSIS — E854 Organ-limited amyloidosis: Secondary | ICD-10-CM

## 2021-11-26 DIAGNOSIS — I10 Essential (primary) hypertension: Secondary | ICD-10-CM | POA: Diagnosis not present

## 2021-11-26 DIAGNOSIS — I5042 Chronic combined systolic (congestive) and diastolic (congestive) heart failure: Secondary | ICD-10-CM

## 2021-11-26 DIAGNOSIS — I43 Cardiomyopathy in diseases classified elsewhere: Secondary | ICD-10-CM | POA: Diagnosis not present

## 2021-11-27 LAB — BASIC METABOLIC PANEL
BUN/Creatinine Ratio: 17 (ref 10–24)
BUN: 20 mg/dL (ref 8–27)
CO2: 24 mmol/L (ref 20–29)
Calcium: 9.6 mg/dL (ref 8.6–10.2)
Chloride: 96 mmol/L (ref 96–106)
Creatinine, Ser: 1.15 mg/dL (ref 0.76–1.27)
Glucose: 89 mg/dL (ref 70–99)
Potassium: 4.8 mmol/L (ref 3.5–5.2)
Sodium: 137 mmol/L (ref 134–144)
eGFR: 71 mL/min/{1.73_m2} (ref >59)

## 2021-11-27 LAB — MAGNESIUM: Magnesium: 2.1 mg/dL (ref 1.6–2.3)

## 2021-11-28 ENCOUNTER — Encounter (HOSPITAL_BASED_OUTPATIENT_CLINIC_OR_DEPARTMENT_OTHER): Payer: Self-pay

## 2021-11-28 ENCOUNTER — Emergency Department (HOSPITAL_BASED_OUTPATIENT_CLINIC_OR_DEPARTMENT_OTHER)
Admission: EM | Admit: 2021-11-28 | Discharge: 2021-11-28 | Disposition: A | Discharge status: Home / Self Care | Payer: BC Managed Care – PPO | Attending: Emergency Medicine | Admitting: Emergency Medicine

## 2021-11-28 ENCOUNTER — Emergency Department (HOSPITAL_BASED_OUTPATIENT_CLINIC_OR_DEPARTMENT_OTHER): Payer: BC Managed Care – PPO | Admitting: Radiology

## 2021-11-28 ENCOUNTER — Telehealth: Payer: Self-pay | Admitting: Home Health

## 2021-11-28 ENCOUNTER — Emergency Department (HOSPITAL_BASED_OUTPATIENT_CLINIC_OR_DEPARTMENT_OTHER): Payer: BC Managed Care – PPO

## 2021-11-28 ENCOUNTER — Other Ambulatory Visit: Payer: Self-pay

## 2021-11-28 DIAGNOSIS — Z7982 Long term (current) use of aspirin: Secondary | ICD-10-CM | POA: Diagnosis not present

## 2021-11-28 DIAGNOSIS — R7989 Other specified abnormal findings of blood chemistry: Secondary | ICD-10-CM | POA: Diagnosis not present

## 2021-11-28 DIAGNOSIS — R509 Fever, unspecified: Secondary | ICD-10-CM | POA: Diagnosis not present

## 2021-11-28 DIAGNOSIS — R079 Chest pain, unspecified: Secondary | ICD-10-CM | POA: Diagnosis not present

## 2021-11-28 DIAGNOSIS — I10 Essential (primary) hypertension: Secondary | ICD-10-CM | POA: Diagnosis not present

## 2021-11-28 DIAGNOSIS — D72829 Elevated white blood cell count, unspecified: Secondary | ICD-10-CM | POA: Diagnosis not present

## 2021-11-28 DIAGNOSIS — Z20822 Contact with and (suspected) exposure to covid-19: Secondary | ICD-10-CM | POA: Insufficient documentation

## 2021-11-28 DIAGNOSIS — R059 Cough, unspecified: Secondary | ICD-10-CM | POA: Insufficient documentation

## 2021-11-28 DIAGNOSIS — Z79899 Other long term (current) drug therapy: Secondary | ICD-10-CM | POA: Diagnosis not present

## 2021-11-28 LAB — BASIC METABOLIC PANEL
Anion gap: 12 (ref 5–15)
BUN: 19 mg/dL (ref 8–23)
CO2: 25 mmol/L (ref 22–32)
Calcium: 9.7 mg/dL (ref 8.9–10.3)
Chloride: 98 mmol/L (ref 98–111)
Creatinine, Ser: 1.04 mg/dL (ref 0.61–1.24)
GFR, Estimated: >60 mL/min (ref >60)
Glucose, Bld: 94 mg/dL (ref 70–99)
Potassium: 4.2 mmol/L (ref 3.5–5.1)
Sodium: 135 mmol/L (ref 135–145)

## 2021-11-28 LAB — CBC
HCT: 37.2 % — ABNORMAL LOW (ref 39.0–52.0)
Hemoglobin: 12.6 g/dL — ABNORMAL LOW (ref 13.0–17.0)
MCH: 29.4 pg (ref 26.0–34.0)
MCHC: 33.9 g/dL (ref 30.0–36.0)
MCV: 86.7 fL (ref 80.0–100.0)
Platelets: 256 10*3/uL (ref 150–400)
RBC: 4.29 MIL/uL (ref 4.22–5.81)
RDW: 12.9 % (ref 11.5–15.5)
WBC: 11.1 10*3/uL — ABNORMAL HIGH (ref 4.0–10.5)
nRBC: 0 % (ref 0.0–0.2)

## 2021-11-28 LAB — RESP PANEL BY RT-PCR (FLU A&B, COVID) ARPGX2
Influenza A by PCR: NEGATIVE
Influenza B by PCR: NEGATIVE
SARS Coronavirus 2 by RT PCR: NEGATIVE

## 2021-11-28 LAB — TROPONIN I (HIGH SENSITIVITY)
Troponin I (High Sensitivity): 29 ng/L — ABNORMAL HIGH (ref <18)
Troponin I (High Sensitivity): 29 ng/L — ABNORMAL HIGH (ref <18)

## 2021-11-28 LAB — D-DIMER, QUANTITATIVE: D-Dimer, Quant: 3.59 ug/mL-FEU — ABNORMAL HIGH (ref 0.00–0.50)

## 2021-11-28 MED ORDER — IOHEXOL 350 MG/ML SOLN
100.0000 mL | Freq: Once | INTRAVENOUS | Status: AC | PRN
  Administered 2021-11-28: 77 mL via INTRAVENOUS

## 2021-11-28 NOTE — Telephone Encounter (Signed)
Patient called after hour line, reporting fever 103 since November 19 2021, had recurrent fever 101 later until Sunday. He had some dry cough, feeling tired. Fever resolved initially and recurred on Friday up to 101. He feels "uneasy" in his chest. He is wondering if he has an infection. Advised the patient to go to nearest urgent care for evaluation as his fever is not resolving and he has now more symptoms.

## 2021-11-28 NOTE — ED Provider Notes (Signed)
[facility] EMERGENCY DEPT Provider Note   CSN: [id number] Arrival date & time: 11/28/21  1358     History  Chief Complaint  Patient presents with   Fever    Andrew Vaughn is a 65 y.o. male.   Fever Patient presents with fever.  Has had for about 2 weeks now.  Occasional cough without production.  Had improved and then returned again.  Felt as a little uneasy in his chest.  No dysuria.  States he feels a little is like when he had a PE in the past.  Not currently on anticoagulation.  Thought to be provoked previously and did a year of anticoagulation.  Is on treatment for amyloidosis but does not appear to be on immunosuppression.    Past Medical History:  Diagnosis Date   Glaucoma    per Dr. Ellie Lunch   Hypertension     Home Medications Prior to Admission medications   Medication Sig Start Date End Date Taking? Authorizing Provider  Ascorbic Acid (VITAMIN C) 1000 MG tablet Take 1,000 mg by mouth daily.    [provider]  aspirin EC 81 MG tablet Take 1 tablet (81 mg total) by mouth daily. Swallow whole. 02/26/20   Donato Heinz, MD  atorvastatin (LIPITOR) 40 MG tablet TAKE 1 TABLET(40 MG) BY MOUTH DAILY AT 6 PM 07/19/21   Donato Heinz, MD  carvedilol (COREG) 12.5 MG tablet TAKE 1 TABLET(12.5 MG) BY MOUTH TWICE DAILY WITH A MEAL 11/27/20   Donato Heinz, MD  dapagliflozin propanediol (FARXIGA) 10 MG TABS tablet Take 1 tablet (10 mg total) by mouth daily before breakfast. 11/04/21   Donato Heinz, MD  ENTRESTO 49-51 MG TAKE 1 TABLET BY MOUTH TWICE DAILY 10/28/21   Donato Heinz, MD  furosemide (LASIX) 40 MG tablet Take 1 tablet (40 mg total) by mouth as needed (weight increase of 3 lbs overnight or 5 lbs in 1 week). 11/04/21   Donato Heinz, MD  Multiple Vitamin (MULTIVITAMIN) capsule Take 1 capsule by mouth daily.    [provider]  spironolactone (ALDACTONE) 25 MG tablet Take 0.5 tablets  (12.5 mg total) by mouth daily. 11/04/21 10/30/22  Donato Heinz, MD  travoprost, benzalkonium, (TRAVATAN) 0.004 % ophthalmic solution Place 1 drop into both eyes daily.    [provider]  VYNDAMAX 61 MG CAPS TAKE 1 CAPSULE ONCE DAILY 09/03/21   Donato Heinz, MD      Allergies    Shrimp [shellfish allergy], Erythromycin, and Penicillins    Review of Systems   Review of Systems  Constitutional:  Positive for fever.    Physical Exam Updated Vital Signs BP 132/76   Pulse 71   Temp 98.9 F (37.2 C) (Oral)   Resp 19   Ht 6' (1.829 m)   Wt 99.8 kg   SpO2 100%   BMI 29.84 kg/m  Physical Exam Vitals and nursing note reviewed.  Cardiovascular:     Rate and Rhythm: Regular rhythm.  Pulmonary:     Breath sounds: No wheezing or rhonchi.     Comments: Occasional cough. Musculoskeletal:        General: No tenderness.     Cervical back: Neck supple.  Skin:    General: Skin is warm.     Capillary Refill: Capillary refill takes less than 2 seconds.  Neurological:     Mental Status: He is alert and oriented to person, place, and time.     ED Results /  Procedures / Treatments   Labs (all labs ordered are listed, but only abnormal results are displayed) Labs Reviewed  CBC - Abnormal; Notable for the following components:      Result Value   WBC 11.1 (*)    Hemoglobin 12.6 (*)    HCT 37.2 (*)    All other components within normal limits  D-DIMER, QUANTITATIVE - Abnormal; Notable for the following components:   D-Dimer, Quant 3.59 (*)    All other components within normal limits  TROPONIN I (HIGH SENSITIVITY) - Abnormal; Notable for the following components:   Troponin I (High Sensitivity) 29 (*)    All other components within normal limits  TROPONIN I (HIGH SENSITIVITY) - Abnormal; Notable for the following components:   Troponin I (High Sensitivity) 29 (*)    All other components within normal limits  RESP PANEL BY RT-PCR (FLU A&B, COVID) [id number]   BASIC METABOLIC PANEL    EKG EKG Interpretation  Date/Time:  Sunday November 28 2021 15:02:26 EDT Ventricular Rate:  71 PR Interval:  230 QRS Duration: 98 QT Interval:  414 QTC Calculation: 449 R Axis:   111 Text Interpretation: Sinus rhythm with 1st degree A-V block Right axis deviation Possible Anterior infarct (cited on or before 22-Jan-2019) T wave abnormality, consider inferior ischemia Abnormal ECG When compared with ECG of 22-Jan-2019 05:53, PR interval has increased Questionable change in initial forces of Lateral leads T wave inversion now evident in Lateral leads Confirmed by Davonna Belling [phone]) on 11/28/2021 4:21:06 PM  Radiology CT Angio Chest PE W and/or Wo Contrast  Result Date: 11/28/2021 CLINICAL DATA:  Uneasy feeling in chest elevated D-dimer EXAM: CT ANGIOGRAPHY CHEST WITH CONTRAST TECHNIQUE: Multidetector CT imaging of the chest was performed using the standard protocol during bolus administration of intravenous contrast. Multiplanar CT image reconstructions and MIPs were obtained to evaluate the vascular anatomy. RADIATION DOSE REDUCTION: This exam was performed according to the departmental dose-optimization program which includes automated exposure control, adjustment of the mA and/or kV according to patient size and/or use of iterative reconstruction technique. CONTRAST:  57m OMNIPAQUE IOHEXOL 350 MG/ML SOLN COMPARISON:  Chest x-ray 11/28/2021, CT chest 03/09/2020 FINDINGS: Cardiovascular: Satisfactory opacification of the pulmonary arteries to the segmental level. No evidence of pulmonary embolism. Mild aneurysmal dilatation of the ascending aorta measuring to 4.3 cm. Mild cardiomegaly. No pericardial effusion Mediastinum/Nodes: Borderline mediastinal lymph nodes. AP window lymph node measures 9 mm. Left precarinal node measures 14 mm. Thyroid gland, trachea, and esophagus demonstrate no significant findings. Lungs/Pleura: Lungs are clear. No pleural effusion or  pneumothorax. Upper Abdomen: No acute abnormality.  Gallstones Musculoskeletal: No chest wall abnormality. No acute or significant osseous findings. Review of the MIP images confirms the above findings. IMPRESSION: 1. Negative for acute pulmonary embolus. 2. Ascending aortic aneurysm measuring up to 4.3 cm. Recommend annual imaging followup by CTA or MRA. This recommendation follows 2010 ACCF/AHA/AATS/ACR/ASA/SCA/SCAI/SIR/STS/SVM Guidelines for the Diagnosis and Management of Patients with Thoracic Aortic Disease. Circulation. 2010; 121:: M196-Q229 Aortic aneurysm NOS (ICD10-I71.9) 3. Cardiomegaly 4. Gallstones Electronically Signed   By: KDonavan FoilM.D.   On: 11/28/2021 18:50   DG Chest 2 View  Result Date: 11/28/2021 CLINICAL DATA:  Chest pain.  Fever unknown origin.  Cough. EXAM: CHEST - 2 VIEW COMPARISON:  01/21/2019 FINDINGS: Lungs are hyperexpanded. The cardio pericardial silhouette is enlarged. The lungs are clear without focal pneumonia, edema, pneumothorax or pleural effusion. The visualized bony structures of the thorax are unremarkable. Telemetry leads overlie the chest.  IMPRESSION: Hyperexpansion without acute cardiopulmonary findings. Electronically Signed   By: Misty Stanley M.D.   On: 11/28/2021 16:34    Procedures Procedures    Medications Ordered in ED Medications  iohexol (OMNIPAQUE) 350 MG/ML injection 100 mL (77 mLs Intravenous Contrast Given 11/28/21 1830)    ED Course/ Medical Decision Making/ A&P                           Medical Decision Making Amount and/or Complexity of Data Reviewed Labs: ordered. Radiology: ordered.  Risk Prescription drug management.   Patient presents with fever.  Has had for the last couple weeks.  Occasional cough.  Some uneasiness in the chest almost palpitations.  Similar symptoms to when he had a PE in the past.  Not currently on anticoagulation.  Differential diagnosis includes pneumonia, arrhythmia, pulm embolism. Work-up reassuring.   White count just mildly elevated.  Doubt a severe sepsis.  D-dimer done due to previous PE and however is elevated.  CT angiography done and reassuring no pneumonia or pulmonary embolism.  Independently interpreted by me.  Does have troponin that is mildly elevated although it appears this is somewhat chronic and likely related to his amyloidosis.  Do not think that he needs admission to the hospital at this time.  Can follow-up with PCP and cardiology.  Will discharge home        Final Clinical Impression(s) / ED Diagnoses Final diagnoses:  Fever, unspecified fever cause    Rx / DC Orders ED Discharge Orders     None         Davonna Belling, MD 11/28/21 1945

## 2021-11-28 NOTE — ED Notes (Signed)
Patient transported to X-ray 

## 2021-11-28 NOTE — ED Notes (Signed)
Iv insertion attempted unsuccessful

## 2021-11-28 NOTE — ED Notes (Signed)
Discharge paperwork given and understood. Provider aware of Temp of 100.5 and Pt informed to take tylenol when he gets home.

## 2021-11-28 NOTE — ED Triage Notes (Signed)
Patient here POV from Home.  Endorses having a High Grade Fever (103.6) that began Late June. Fever has since subsided into a Low Grade Fever of 100.6 more Recently.   Symptoms include Dry Cough, Fatigue, and a General Uneasiness in Chest (No Discernable Pain).  Concerned as Patient had Similar Symptoms to 2020 Diagnosis of PE. No Discernable SOB. Currently only taking ASA.  NAD Noted during Triage. A&Ox4. GCS 15. Ambulatory.

## 2021-11-30 ENCOUNTER — Telehealth (INDEPENDENT_AMBULATORY_CARE_PROVIDER_SITE_OTHER): Payer: BC Managed Care – PPO | Admitting: Family Medicine

## 2021-11-30 DIAGNOSIS — R509 Fever, unspecified: Secondary | ICD-10-CM | POA: Diagnosis not present

## 2021-11-30 DIAGNOSIS — R059 Cough, unspecified: Secondary | ICD-10-CM | POA: Diagnosis not present

## 2021-11-30 DIAGNOSIS — R519 Headache, unspecified: Secondary | ICD-10-CM

## 2021-11-30 MED ORDER — DOXYCYCLINE HYCLATE 100 MG PO TABS: 100.0000 mg | ORAL_TABLET | Freq: Two times a day (BID) | ORAL | 0 refills | Status: DC

## 2021-11-30 NOTE — Patient Instructions (Signed)
-  I sent the medication(s) we discussed to your pharmacy: Meds ordered this encounter  Medications   doxycycline (VIBRA-TABS) 100 MG tablet    Sig: Take 1 tablet (100 mg total) by mouth 2 (two) times daily.    Dispense:  20 tablet    Refill:  0   Schedule follow up with PCP in 7-14 days.   I hope you are feeling better soon!  Seek in person care promptly if your symptoms worsen, new concerns arise or you are not improving with treatment.  It was nice to meet you today. I help [facility] out with telemedicine visits on Tuesdays and Thursdays and am happy to help if you need a virtual follow up visit on those days. Otherwise, if you have any concerns or questions following this visit please schedule a follow up visit with your Primary Care office or seek care at a local urgent care clinic to avoid delays in care. If you are having severe or life threatening symptoms please call 911 and/or go to the nearest emergency room.

## 2021-11-30 NOTE — Progress Notes (Signed)
Virtual Visit via Video Note  I connected with Andrew Vaughn  on 11/30/21 at 10:20 AM EDT by a video enabled telemedicine application and verified that I am speaking with the correct person using two identifiers.  Location patient: [place] Location provider:work or home office Persons participating in the virtual visit: patient, provider  I discussed the limitations and requested verbal permission for telemedicine visit. The patient expressed understanding and agreed to proceed.   HPI:  Acute telemedicine visit for cough: -Onset: started about 2 weeks ago -Symptoms include: high fever and flu like symptoms initially with cough and sinus congestion, some mild body aches in the back/neck, then improved after a few days but still felt a bit tired and had sinus congestion and persistent cough now with cough, frontal sinus discomfort and low grade fevers (around 100) the last few days -Denies:CP, SOB, NVD, rashes, weight loss, joint pains, abd pain, dysuria, insect bites, tick bites, mosquito bites, known sick contacts -has had workup at ER 2 days ago with neg CT angio, and has talked with his cardiologist who didn't feel anything of concerned regarding his heat, had covid/flu testing and labs a well - reviewed w/ pt -Has tried:nothing -Pertinent past medical history: see below, notes hx of sinus infections -reports told ER about the sinus issues, but reports they did not treat as were more concerned about ensuring was not a PE, told him to f/u w/ PCP -Pertinent medication allergies: Allergies  Allergen Reactions   Shrimp [Shellfish Allergy] Nausea Only   Erythromycin Rash   Penicillins Rash  -COVID-19 vaccine status:  There is no immunization history on file for this patient.   ROS: See pertinent positives and negatives per HPI.  Past Medical History:  Diagnosis Date   Glaucoma    per Dr. Ellie Lunch   Hypertension     Past Surgical History:  Procedure Laterality Date   dislocated left shoulder   1993   right knee giant cell bone tumor  1998   [facility] Dr. Leonides Schanz [facility]   ruptured lumbar disc  1976   torn meniscus left knee  8/08   Dr. French Ana     Current Outpatient Medications:    doxycycline (VIBRA-TABS) 100 MG tablet, Take 1 tablet (100 mg total) by mouth 2 (two) times daily., Disp: 20 tablet, Rfl: 0   Ascorbic Acid (VITAMIN C) 1000 MG tablet, Take 1,000 mg by mouth daily., Disp: , Rfl:    aspirin EC 81 MG tablet, Take 1 tablet (81 mg total) by mouth daily. Swallow whole., Disp: 90 tablet, Rfl: 3   atorvastatin (LIPITOR) 40 MG tablet, TAKE 1 TABLET(40 MG) BY MOUTH DAILY AT 6 PM, Disp: 90 tablet, Rfl: 3   carvedilol (COREG) 12.5 MG tablet, TAKE 1 TABLET(12.5 MG) BY MOUTH TWICE DAILY WITH A MEAL, Disp: 180 tablet, Rfl: 2   dapagliflozin propanediol (FARXIGA) 10 MG TABS tablet, Take 1 tablet (10 mg total) by mouth daily before breakfast., Disp: 30 tablet, Rfl: 3   ENTRESTO 49-51 MG, TAKE 1 TABLET BY MOUTH TWICE DAILY, Disp: 180 tablet, Rfl: 2   furosemide (LASIX) 40 MG tablet, Take 1 tablet (40 mg total) by mouth as needed (weight increase of 3 lbs overnight or 5 lbs in 1 week)., Disp: 30 tablet, Rfl: 3   Multiple Vitamin (MULTIVITAMIN) capsule, Take 1 capsule by mouth daily., Disp: , Rfl:    spironolactone (ALDACTONE) 25 MG tablet, Take 0.5 tablets (12.5 mg total) by mouth daily., Disp: 45 tablet, Rfl: 3  travoprost, benzalkonium, (TRAVATAN) 0.004 % ophthalmic solution, Place 1 drop into both eyes daily., Disp: , Rfl:    VYNDAMAX 61 MG CAPS, TAKE 1 CAPSULE ONCE DAILY, Disp: 90 capsule, Rfl: 1  EXAM:  VITALS per patient if applicable: no fever now, intermittent low grade the last few days  GENERAL: alert, oriented, appears well and in no acute distress  HEENT: atraumatic, conjunttiva clear, no obvious abnormalities on inspection of external nose and ears  NECK: normal movements of the head and neck  LUNGS: on inspection no signs of respiratory distress, breathing rate  appears normal, no obvious gross SOB, gasping or wheezing, coughs intermittently throughout the appt  CV: no obvious cyanosis  MS: moves all visible extremities without noticeable abnormality  PSYCH/NEURO: pleasant and cooperative, no obvious depression or anxiety, speech and thought processing grossly intact  ASSESSMENT AND PLAN:  Discussed the following assessment and plan:  Cough, unspecified type  Facial discomfort  Low grade fever  -we discussed possible serious and likely etiologies, options for evaluation and workup, limitations of telemedicine visit vs in person visit, treatment, treatment risks and precautions. Pt is agreeable to treatment via telemedicine at this moment. Query sinusitis 2ndary to VURI vs other. He has opted to try doxy '100mg'$  bid x 10 days. Discussed long list of infectious, inflammatory, neoplastic causes of fevers/malaise and advised 2 week follow up with PCP to ensure improving/symptoms resolved on abx. He agreed.  Scheduled follow up with PCP offered: Sent message to schedulers to assist and advised patient to contact PCP office to schedule if does not receive call back in next 24 hours. Advise to seek prompt  in person care sooner if worsening, new symptoms arise, or if is not improving with treatment as expected per our conversation of expected course. Discussed options for follow up care. Did let this patient know that I do telemedicine on Tuesdays and Thursdays for [facility] and those are the days I am logged into the system. Advised to schedule follow up visit with PCP, [facility] virtual visits or [facility] if any further questions or concerns to avoid delays in care.   I discussed the assessment and treatment plan with the patient. Spent 35 minute on this case in review of records, consultation with pt and counseling/treatment/care coordination. The patient was provided an opportunity to ask questions and all were answered. The patient agreed with the plan and  demonstrated an understanding of the instructions.     Lucretia Kern, DO

## 2021-12-02 ENCOUNTER — Ambulatory Visit: Payer: BC Managed Care – PPO | Admitting: Family Medicine

## 2021-12-02 ENCOUNTER — Telehealth: Payer: Self-pay | Admitting: Family Medicine

## 2021-12-02 ENCOUNTER — Encounter: Payer: Self-pay | Admitting: Family Medicine

## 2021-12-02 VITALS — BP 122/80 | HR 59 | Temp 97.8°F | Wt 219.0 lb

## 2021-12-02 DIAGNOSIS — T2113XA Burn of first degree of upper back, initial encounter: Secondary | ICD-10-CM | POA: Diagnosis not present

## 2021-12-02 DIAGNOSIS — J209 Acute bronchitis, unspecified: Secondary | ICD-10-CM | POA: Diagnosis not present

## 2021-12-02 DIAGNOSIS — B9689 Other specified bacterial agents as the cause of diseases classified elsewhere: Secondary | ICD-10-CM

## 2021-12-02 DIAGNOSIS — J069 Acute upper respiratory infection, unspecified: Secondary | ICD-10-CM

## 2021-12-02 DIAGNOSIS — T508X5A Adverse effect of diagnostic agents, initial encounter: Secondary | ICD-10-CM

## 2021-12-02 DIAGNOSIS — R21 Rash and other nonspecific skin eruption: Secondary | ICD-10-CM

## 2021-12-02 DIAGNOSIS — T2123XA Burn of second degree of upper back, initial encounter: Secondary | ICD-10-CM

## 2021-12-02 LAB — CBC WITH DIFFERENTIAL/PLATELET
Basophils Absolute: 0 10*3/uL (ref 0.0–0.1)
Basophils Relative: 0.5 % (ref 0.0–3.0)
Eosinophils Absolute: 0.3 10*3/uL (ref 0.0–0.7)
Eosinophils Relative: 3.5 % (ref 0.0–5.0)
HCT: 37.6 % — ABNORMAL LOW (ref 39.0–52.0)
Hemoglobin: 12.6 g/dL — ABNORMAL LOW (ref 13.0–17.0)
Lymphocytes Relative: 14.3 % (ref 12.0–46.0)
Lymphs Abs: 1 10*3/uL (ref 0.7–4.0)
MCHC: 33.6 g/dL (ref 30.0–36.0)
MCV: 86.1 fl (ref 78.0–100.0)
Monocytes Absolute: 0.4 10*3/uL (ref 0.1–1.0)
Monocytes Relative: 5 % (ref 3.0–12.0)
Neutro Abs: 5.6 10*3/uL (ref 1.4–7.7)
Neutrophils Relative %: 76.7 % (ref 43.0–77.0)
Platelets: 283 10*3/uL (ref 150.0–400.0)
RBC: 4.36 Mil/uL (ref 4.22–5.81)
RDW: 13.2 % (ref 11.5–15.5)
WBC: 7.3 10*3/uL (ref 4.0–10.5)

## 2021-12-02 LAB — BASIC METABOLIC PANEL
BUN: 25 mg/dL — ABNORMAL HIGH (ref 6–23)
CO2: 29 mEq/L (ref 19–32)
Calcium: 10 mg/dL (ref 8.4–10.5)
Chloride: 99 mEq/L (ref 96–112)
Creatinine, Ser: 1.02 mg/dL (ref 0.40–1.50)
GFR: 77.53 mL/min (ref >60.00)
Glucose, Bld: 104 mg/dL — ABNORMAL HIGH (ref 70–99)
Potassium: 4.1 mEq/L (ref 3.5–5.1)
Sodium: 137 mEq/L (ref 135–145)

## 2021-12-02 NOTE — Progress Notes (Signed)
Subjective:    Patient ID: Andrew Vaughn, male    DOB: 1955-11-22, 66 y.o.   MRN: [id number]  HPI Here to follow up an ED visit on 11-28-21 and a virtual visit on 11-30-21. About 3 weeks ago he developed some recurrent fevers and a dry cough. No SOB or chest pain. He went to the ED on 11-28-21 and the workup included labs that showed a WBC of 11.1 and a D dimer of 3.59. his CXR was clear. He had a chest CTA which ruled out pneumonia and PE. He tested negative for Covid and flu. He was told he had a viral infection and to rest and drink fluids. He did not impove so on 11-30-21 he had a virtual visit with Dr. Maudie Mercury. She started him on Doxycycline, and his cough improved. The fevers went away also, although he did wake up with some sweats this morning. Also over the past 3 days he has developed a slightly itchy rash all over his body. His family are worried this could be a sign of Lyme disease (no recent hx of tick bites). In addition he noticed a large tender red spot on this left upper back yesterday, and he remembers that he had a sore muscle there the day before and he had applied a heating pad to it for a few hours. Today he has no SOB or chest pain. No joint pains.    Review of Systems  Constitutional:  Positive for diaphoresis and fever. Negative for chills.  HENT: Negative.    Eyes: Negative.   Respiratory:  Positive for cough. Negative for chest tightness, shortness of breath and wheezing.   Cardiovascular: Negative.   Gastrointestinal: Negative.   Genitourinary: Negative.   Skin:  Positive for rash.       Objective:   Physical Exam Constitutional:      Appearance: Normal appearance.  HENT:     Right Ear: Tympanic membrane, ear canal and external ear normal.     Left Ear: Tympanic membrane, ear canal and external ear normal.     Nose: No congestion.     Mouth/Throat:     Pharynx: Oropharynx is clear.  Eyes:     Conjunctiva/sclera: Conjunctivae normal.  Cardiovascular:     Rate  and Rhythm: Normal rate and regular rhythm.     Pulses: Normal pulses.     Heart sounds: Normal heart sounds.  Pulmonary:     Effort: Pulmonary effort is normal.     Breath sounds: Normal breath sounds.  Lymphadenopathy:     Cervical: No cervical adenopathy.  Skin:    Comments: There is a 2 cm red raised area on the left upper back that is tender. The upper skin layers of this appear to have sloughed off . Also there is a widespread macular slighty red rash over the arms, legs, and trunk   Neurological:     Mental Status: He is alert.           Assessment & Plan:  In retrospect he seems to have had an atypical bronchitis which is responding to treatment with Doxycycline. I told him this was an excellent choice of antibiotic for this problem. I think his rash is evidence of a mild allergic reaction to the contrast dye used for his CT angiogram a few days ago. This does not look like erythema marginatum and it is very unlikely he has Lyme disease. To make sure, we will check Lyme titers today. I told him  that even if he did have Lyme disease, Doxycyline is the treatment of choice. The larger red spot on his back appears to be a burn mark from using the heating pad. We will also check a CBC and BMET today. He will finish out the Doxycycline. All these issues should resolve over the next week or two. Recheck as needed. We spent a total of ( 34  ) minutes reviewing records and discussing these issues.  Alysia Penna, MD

## 2021-12-02 NOTE — Telephone Encounter (Signed)
error 

## 2021-12-06 ENCOUNTER — Encounter: Payer: Self-pay | Admitting: *Deleted

## 2021-12-06 ENCOUNTER — Emergency Department (HOSPITAL_BASED_OUTPATIENT_CLINIC_OR_DEPARTMENT_OTHER): Payer: BC Managed Care – PPO

## 2021-12-06 ENCOUNTER — Emergency Department (HOSPITAL_BASED_OUTPATIENT_CLINIC_OR_DEPARTMENT_OTHER)
Admission: EM | Admit: 2021-12-06 | Discharge: 2021-12-06 | Disposition: A | Discharge status: Home / Self Care | Payer: BC Managed Care – PPO | Attending: Emergency Medicine | Admitting: Emergency Medicine

## 2021-12-06 ENCOUNTER — Other Ambulatory Visit: Payer: Self-pay | Admitting: Cardiology

## 2021-12-06 ENCOUNTER — Encounter (HOSPITAL_BASED_OUTPATIENT_CLINIC_OR_DEPARTMENT_OTHER): Payer: Self-pay | Admitting: Emergency Medicine

## 2021-12-06 ENCOUNTER — Other Ambulatory Visit: Payer: Self-pay

## 2021-12-06 DIAGNOSIS — I110 Hypertensive heart disease with heart failure: Secondary | ICD-10-CM | POA: Insufficient documentation

## 2021-12-06 DIAGNOSIS — I509 Heart failure, unspecified: Secondary | ICD-10-CM | POA: Diagnosis not present

## 2021-12-06 DIAGNOSIS — R0789 Other chest pain: Secondary | ICD-10-CM | POA: Diagnosis not present

## 2021-12-06 DIAGNOSIS — R072 Precordial pain: Secondary | ICD-10-CM | POA: Insufficient documentation

## 2021-12-06 DIAGNOSIS — R079 Chest pain, unspecified: Secondary | ICD-10-CM | POA: Diagnosis not present

## 2021-12-06 LAB — CBC WITH DIFFERENTIAL/PLATELET
Abs Immature Granulocytes: 0.04 10*3/uL (ref 0.00–0.07)
Basophils Absolute: 0.1 10*3/uL (ref 0.0–0.1)
Basophils Relative: 1 %
Eosinophils Absolute: 0.1 10*3/uL (ref 0.0–0.5)
Eosinophils Relative: 1 %
HCT: 42 % (ref 39.0–52.0)
Hemoglobin: 13.7 g/dL (ref 13.0–17.0)
Immature Granulocytes: 1 %
Lymphocytes Relative: 20 %
Lymphs Abs: 1.6 10*3/uL (ref 0.7–4.0)
MCH: 28.9 pg (ref 26.0–34.0)
MCHC: 32.6 g/dL (ref 30.0–36.0)
MCV: 88.6 fL (ref 80.0–100.0)
Monocytes Absolute: 0.4 10*3/uL (ref 0.1–1.0)
Monocytes Relative: 5 %
Neutro Abs: 5.7 10*3/uL (ref 1.7–7.7)
Neutrophils Relative %: 72 %
Platelets: 352 10*3/uL (ref 150–400)
RBC: 4.74 MIL/uL (ref 4.22–5.81)
RDW: 12.6 % (ref 11.5–15.5)
WBC: 7.8 10*3/uL (ref 4.0–10.5)
nRBC: 0 % (ref 0.0–0.2)

## 2021-12-06 LAB — B. BURGDORFI ANTIBODIES BY WB
B burgdorferi IgG Abs (IB): POSITIVE — AB
B burgdorferi IgM Abs (IB): POSITIVE — AB
Lyme Disease 18 kD IgG: NONREACTIVE
Lyme Disease 23 kD IgG: REACTIVE — AB
Lyme Disease 23 kD IgM: REACTIVE — AB
Lyme Disease 28 kD IgG: NONREACTIVE
Lyme Disease 30 kD IgG: NONREACTIVE
Lyme Disease 39 kD IgG: REACTIVE — AB
Lyme Disease 39 kD IgM: REACTIVE — AB
Lyme Disease 41 kD IgG: REACTIVE — AB
Lyme Disease 41 kD IgM: REACTIVE — AB
Lyme Disease 45 kD IgG: REACTIVE — AB
Lyme Disease 58 kD IgG: REACTIVE — AB
Lyme Disease 66 kD IgG: NONREACTIVE
Lyme Disease 93 kD IgG: REACTIVE — AB

## 2021-12-06 LAB — BASIC METABOLIC PANEL
Anion gap: 10 (ref 5–15)
BUN: 18 mg/dL (ref 8–23)
CO2: 28 mmol/L (ref 22–32)
Calcium: 10.1 mg/dL (ref 8.9–10.3)
Chloride: 98 mmol/L (ref 98–111)
Creatinine, Ser: 1.02 mg/dL (ref 0.61–1.24)
GFR, Estimated: >60 mL/min (ref >60)
Glucose, Bld: 102 mg/dL — ABNORMAL HIGH (ref 70–99)
Potassium: 4 mmol/L (ref 3.5–5.1)
Sodium: 136 mmol/L (ref 135–145)

## 2021-12-06 LAB — TROPONIN I (HIGH SENSITIVITY)
Troponin I (High Sensitivity): 29 ng/L — ABNORMAL HIGH (ref <18)
Troponin I (High Sensitivity): 25 ng/L — ABNORMAL HIGH (ref <18)

## 2021-12-06 LAB — LYME AB SCREEN RFLX: Lyme AB Screen: 7.15 index — ABNORMAL HIGH

## 2021-12-06 MED ORDER — ASPIRIN 81 MG PO CHEW
243.0000 mg | CHEWABLE_TABLET | Freq: Once | ORAL | Status: AC
  Administered 2021-12-06: 243 mg via ORAL
  Filled 2021-12-06: qty 3

## 2021-12-06 NOTE — ED Triage Notes (Signed)
Pt c/o intermittent central chest pain over the weekend. Pt states that the pain radiates to his back.

## 2021-12-06 NOTE — Discharge Instructions (Signed)
You were seen in the emergency room today with chest discomfort.  Your lab work appears stable for you but I would like for you to follow very closely with a cardiologist this week.  Please continue your home medications.  If you develop return or worsening chest discomfort you need to call 911 and/or return to the emergency department immediately for reevaluation.  Please continue your doxycycline.  I have sent additional tick borne illness labs and these will come across in the MyChart app.

## 2021-12-06 NOTE — ED Provider Notes (Signed)
Emergency Department Provider Note   I have reviewed the triage vital signs and the nursing notes.   HISTORY  Chief Complaint Chest Pain   HPI Andrew Vaughn is a 66 y.o. male with PMH of HTN, CHF (amyloid), and provoked PE not on anticoagulation presents to the emergency department for evaluation of chest discomfort which is occurred intermittently over the past 2 days.  Patient states he was seen in the ED on 7/9 with fever but also having some chest discomfort at that time.  He was started on doxycycline by his PCP after developing a rash.  During that ED visit he had work-up for ACS but also PE with CTA of the chest showing no PE or other acute process.  He has a known ascending aortic aneurysm which is being followed as an outpatient.  He tells me over the past couple of days this weekend he has had some pressure/tightness in the center of his chest and woke up from sleep diaphoretic.  No active chest pain currently.  No nausea or vomiting.  No abdominal discomfort.  He is not feeling short of breath or having orthopnea.  He is compliant with all medications.   Past Medical History:  Diagnosis Date   Glaucoma    per Dr. Ellie Lunch   Hypertension     Review of Systems  Constitutional: No fever/chills Eyes: No visual changes. ENT: No sore throat. Cardiovascular: Positive chest pain. Respiratory: Denies shortness of breath. Gastrointestinal: No abdominal pain.  No nausea, no vomiting.  No diarrhea.  No constipation. Genitourinary: Negative for dysuria. Musculoskeletal: Negative for back pain. Skin: Negative for rash. Neurological: Negative for headaches, focal weakness or numbness.   ____________________________________________   PHYSICAL EXAM:  VITAL SIGNS: ED Triage Vitals  Enc Vitals Group     BP 12/06/21 0650 (!) 166/102     Pulse Rate 12/06/21 0650 60     Resp 12/06/21 0650 13     Temp 12/06/21 0650 97.8 F (36.6 C)     Temp Source 12/06/21 0650 Oral      SpO2 12/06/21 0650 100 %     Weight 12/06/21 0645 218 lb 14.7 oz (99.3 kg)     Height 12/06/21 0645 6' (1.829 m)   Constitutional: Alert and oriented. Well appearing and in no acute distress. Eyes: Conjunctivae are normal.  Head: Atraumatic. Nose: No congestion/rhinnorhea. Mouth/Throat: Mucous membranes are moist. Neck: No stridor.   Cardiovascular: Normal rate, regular rhythm. Good peripheral circulation. Grossly normal heart sounds.   Respiratory: Normal respiratory effort.  No retractions. Lungs CTAB. Gastrointestinal: Soft and nontender. No distention.  Musculoskeletal: No lower extremity tenderness nor edema. No gross deformities of extremities. Neurologic:  Normal speech and language. No gross focal neurologic deficits are appreciated.  Skin:  Skin is warm, dry and intact. No rash noted.  ____________________________________________   LABS (all labs ordered are listed, but only abnormal results are displayed)  Labs Reviewed  BASIC METABOLIC PANEL - Abnormal; Notable for the following components:      Result Value   Glucose, Bld 102 (*)    All other components within normal limits  TROPONIN I (HIGH SENSITIVITY) - Abnormal; Notable for the following components:   Troponin I (High Sensitivity) 29 (*)    All other components within normal limits  TROPONIN I (HIGH SENSITIVITY) - Abnormal; Notable for the following components:   Troponin I (High Sensitivity) 25 (*)    All other components within normal limits  CBC WITH DIFFERENTIAL/PLATELET  ROCKY MTN SPOTTED FVR ABS PNL(IGG+IGM)   ____________________________________________  EKG   EKG Interpretation  Date/Time:  Monday December 06 2021 06:48:07 EDT Ventricular Rate:  60 PR Interval:  255 QRS Duration: 110 QT Interval:  473 QTC Calculation: 473 R Axis:   107 Text Interpretation: Sinus rhythm Prolonged PR interval Anterior infarct, old Borderline repolarization abnormality Confirmed by Nanda Quinton [phone]) on 12/06/2021  7:04:35 AM        ____________________________________________  RADIOLOGY  No results found.  ____________________________________________   PROCEDURES  Procedure(s) performed:   Procedures  None  ____________________________________________   INITIAL IMPRESSION / ASSESSMENT AND PLAN / ED COURSE  Pertinent labs & imaging results that were available during my care of the patient were reviewed by me and considered in my medical decision making (see chart for details).   This patient is Presenting for Evaluation of CP, which does require a range of treatment options, and is a complaint that involves a high risk of morbidity and mortality.  The Differential Diagnoses includes but is not exclusive to acute coronary syndrome, aortic dissection, pulmonary embolism, cardiac tamponade, community-acquired pneumonia, pericarditis, musculoskeletal chest wall pain, etc.   Critical Interventions-    Medications  aspirin chewable tablet 243 mg (243 mg Oral Given 12/06/21 0750)    Reassessment after intervention: Symptoms improved.    I decided to review pertinent External Data, and in summary no left heart cath reports to review. ECHO and amyloid imaging studies from 2022 and 2021 reviewed.   Clinical Laboratory Tests Ordered, included troponin is mildly elevated at 29 and 25 which are consistent with prior values.  No acute kidney injury.  No anemia.  Radiologic Tests Ordered, included CXR. I independently interpreted the images and agree with radiology interpretation.   Cardiac Monitor Tracing which shows NSR.   Social Determinants of Health Risk patient is a non-smoker.   Medical Decision Making: Summary:  Patient presents emergency department with chest discomfort intermittently over the past couple of days.  EKG appears similar to prior.  Patient recently was seen in the ED with some chest discomfort and fever.  He had work-up including work-up for PE with CTA showing no  PE.  My suspicion for PE is exceedingly low and do not plan to repeat this work-up in such a short timeframe. ACS is a consideration but no prior history.   Reevaluation with update and discussion with patient and wife at bedside.  Patient with stable troponins.  Have sent RMSF titer with fever recently but patient is already on doxycycline.  Discussed need for close cardiology follow-up regarding his chest discomfort although ACS is lower on my differential at this time.    Considered admission but after shared decision-making discussion with patient and wife the decision was made for discharge with referral to cardiology and strict ED return precautions.  Disposition: discharge  ____________________________________________  FINAL CLINICAL IMPRESSION(S) / ED DIAGNOSES  Final diagnoses:  Precordial chest pain    Note:  This document was prepared using Dragon voice recognition software and may include unintentional dictation errors.  Nanda Quinton, MD, [facility] Emergency Medicine    Jakorian Marengo, W[facility], MD 12/07/21 [phone]

## 2021-12-07 ENCOUNTER — Ambulatory Visit: Payer: BC Managed Care – PPO | Admitting: General Practice

## 2021-12-07 ENCOUNTER — Telehealth: Payer: Self-pay | Admitting: Family Medicine

## 2021-12-07 NOTE — Telephone Encounter (Signed)
Pt is calling and he viewed his lyme disease result online and its positive and he would like to know the next step

## 2021-12-07 NOTE — Telephone Encounter (Signed)
Spoke with patient about lab results from 12/02/21.   OV appointment scheduled for 12/08/21.

## 2021-12-08 ENCOUNTER — Ambulatory Visit: Payer: BC Managed Care – PPO | Admitting: Family Medicine

## 2021-12-08 ENCOUNTER — Encounter: Payer: Self-pay | Admitting: Family Medicine

## 2021-12-08 VITALS — BP 108/64 | HR 61 | Temp 98.5°F | Wt 216.0 lb

## 2021-12-08 DIAGNOSIS — A6920 Lyme disease, unspecified: Secondary | ICD-10-CM | POA: Diagnosis not present

## 2021-12-08 LAB — ROCKY MTN SPOTTED FVR ABS PNL(IGG+IGM)
RMSF IgG: NEGATIVE
RMSF IgM: 0.28 index (ref 0.00–0.89)

## 2021-12-08 MED ORDER — DOXYCYCLINE HYCLATE 100 MG PO CAPS: 100.0000 mg | ORAL_CAPSULE | Freq: Two times a day (BID) | ORAL | 0 refills | Status: AC

## 2021-12-08 NOTE — Progress Notes (Signed)
   Subjective:    Patient ID: Andrew Vaughn, male    DOB: 1955/11/23, 66 y.o.   MRN: [id number]  HPI Here to follow up on acute Lyme disease. He went to the ED on 11-28-21 with fever, chest discomfort, and SOB. All testing was negative. He then saw [place] on 12-02-21 with similar symptoms but he has also developed a rash over most of the body. The largest lesion was on the upper left back area. We did lab testing, and he tested positive for Lyme fir both IgM and IgG. He had been taking Doxycycline, so we advised him to continue taking this for the 10 day course. He then returned to the ED on 12-06-21 for a sharper type of chest pain. Again testing was negative for cardiac ischemia, PE, etc. Around the his time he began to feel better in other ways ,and th rash began to fade away. Today he feels completely normal and the rash is barely present.    Review of Systems  Constitutional: Negative.   Respiratory: Negative.    Cardiovascular: Negative.   Gastrointestinal: Negative.   Musculoskeletal:  Positive for arthralgias.  Skin:  Positive for rash.       Objective:   Physical Exam Constitutional:      Appearance: Normal appearance.  Cardiovascular:     Rate and Rhythm: Normal rate and regular rhythm.     Pulses: Normal pulses.     Heart sounds: Normal heart sounds.  Pulmonary:     Effort: Pulmonary effort is normal.     Breath sounds: Normal breath sounds.  Skin:    Comments: His skin is clear today except for a small red remnant of the upper left back lesion   Neurological:     Mental Status: He is alert.           Assessment & Plan:  Lyme disease, responding to treatment with Doxycycline. We agreed to extend this out to a full 30 day treatment. After that he can follow up as needed. We spent a total of ( 32  ) minutes reviewing records and discussing these issues.  Alysia Penna, MD

## 2021-12-09 ENCOUNTER — Ambulatory Visit: Payer: BC Managed Care – PPO | Admitting: Nurse Practitioner

## 2021-12-14 NOTE — Progress Notes (Unsigned)
Cardiology Clinic Note   Patient Name: Andrew Vaughn Date of Encounter: 12/16/2021  Primary Care Provider:  [name], MD Primary Cardiologist:  Donato Heinz, MD  Patient Profile    Andrew Vaughn 66 year old male presents the clinic today for follow-up evaluation of his chest discomfort.  Past Medical History    Past Medical History:  Diagnosis Date   Glaucoma    per Dr. Ellie Lunch   Hypertension    Past Surgical History:  Procedure Laterality Date   dislocated left shoulder  1993   right knee giant cell bone tumor  1998   [facility] Leonides [facility]   ruptured lumbar disc  1976   torn meniscus left knee  8/08   Dr. French Ana    Allergies  Allergies  Allergen Reactions   Shrimp [Shellfish Allergy] Nausea Only   Erythromycin Rash   Ivp Dye [Iodinated Contrast Media] Rash   Penicillins Rash    History of Present Illness    [name] has a PMH of hypertension, cardiac amyloidosis, chronic combined systolic and diastolic CHF, CAD, and HLD.   He was admitted to the hospital 01/20/2021 on 02/20/2019.  He complained of shortness of breath for a few months prior to presenting to the hospital.  He also noted intermittent fevers throughout the month of July.  He had completed a course of doxycycline.  His fevers resolved but he continues to have shortness of breath which prompted him to present to the emergency department.  He underwent echocardiogram 01/22/2020 which showed an LVEF of 40-45%, mild LVH, G3 DD.  He was also diagnosed with PE.  His coronary CTA showed nonobstructive CAD.  He was initiated on Entresto and carvedilol.  His repeat echocardiogram 05/01/2019 showed normalized LV function but severe LVH and G3 DD.  A PYP scan was consistent for cardiac amyloidosis.  His echocardiogram 05/12/2021 showed LVEF of 45-50%, severe LVH, normal RV function, severe left atrial dilation, moderate right atrial dilation, and no significant valvular disease  pulm  He was seen in follow-up by Dr. Gardiner Rhyme on 11/02/2021.  During that time he continued to do well from cardiac standpoint.  He denied chest pain, dyspnea, syncope, lower extremity swelling and palpitations.  He was walking 30 minutes 4 times per week and denied exertional chest discomfort.   Was seen in the emergency department on 12/06/2021.  He complained of chest discomfort that has been present intermittently over the past couple days.  He had also presented to the emergency department on 7/9 for chest discomfort.  He had presented to his PCP who placed him on doxycycline however, he developed a rash.  CTA for PE was negative.  High-sensitivity troponins were 29 and 25.  His EKG showed sinus rhythm with prolonged PR interval.  His blood pressure was elevated 166/102.  He was negative for [facility] spotted fever.  He was instructed to follow-up with cardiology and his PCP.  He followed up with his PCP on 11/28/2021.  He was diagnosed with Lyme disease and was responding well to treatment of doxycycline.  Doxycycline was prescribed for 30 days.  His blood pressure at that time was 108/64.  He presents to the clinic today for follow-up evaluation states after the second day of doxycycline he started to feel much better.  His fevers resolved.  We reviewed his recent ER visits and lab work for Lyme's disease.  At this time he will continue extended treatment of doxycycline.  We reviewed precautions for pericardial fluid and he expressed understanding.  I will have him to maintain his physical activity, continue heart healthy diet, defer echocardiogram at this time and plan follow-up in September as scheduled with Dr. Gardiner Rhyme.  Today he denies chest pain, shortness of breath, lower extremity edema, fatigue, palpitations, melena, hematuria, hemoptysis, diaphoresis, weakness, presyncope, syncope, orthopnea, and PND.    Home Medications    Prior to Admission medications   Medication Sig Start Date  End Date Taking? Authorizing Provider  Ascorbic Acid (VITAMIN C) 1000 MG tablet Take 1,000 mg by mouth daily.    [provider]  aspirin EC 81 MG tablet Take 1 tablet (81 mg total) by mouth daily. Swallow whole. 02/26/20   Donato Heinz, MD  atorvastatin (LIPITOR) 40 MG tablet TAKE 1 TABLET(40 MG) BY MOUTH DAILY AT 6 PM 07/19/21   Donato Heinz, MD  carvedilol (COREG) 12.5 MG tablet TAKE 1 TABLET(12.5 MG) BY MOUTH TWICE DAILY WITH A MEAL 12/06/21   Donato Heinz, MD  dapagliflozin propanediol (FARXIGA) 10 MG TABS tablet Take 1 tablet (10 mg total) by mouth daily before breakfast. 11/04/21   Donato Heinz, MD  doxycycline (VIBRAMYCIN) 100 MG capsule Take 1 capsule (100 mg total) by mouth 2 (two) times daily for 20 days. 12/08/21 12/28/21  [name], MD  ENTRESTO 49-51 MG TAKE 1 TABLET BY MOUTH TWICE DAILY 10/28/21   Donato Heinz, MD  furosemide (LASIX) 40 MG tablet Take 1 tablet (40 mg total) by mouth as needed (weight increase of 3 lbs overnight or 5 lbs in 1 week). 11/04/21   Donato Heinz, MD  Multiple Vitamin (MULTIVITAMIN) capsule Take 1 capsule by mouth daily.    [provider]  spironolactone (ALDACTONE) 25 MG tablet Take 0.5 tablets (12.5 mg total) by mouth daily. 11/04/21 10/30/22  Donato Heinz, MD  travoprost, benzalkonium, (TRAVATAN) 0.004 % ophthalmic solution Place 1 drop into both eyes daily.    [provider]  VYNDAMAX 68 MG CAPS TAKE 1 CAPSULE ONCE DAILY 09/03/21   Donato Heinz, MD    Family History    Family History  Problem Relation Age of Onset   Hypertension Other    Stroke Other        first degree male relative    Heart disease Other    Glaucoma Other    Dementia Mother    CAD Father        MI in his 12s   Heart failure Father    Atrial fibrillation Father    Hypothyroidism Sister    He indicated that his mother is alive. He indicated that his father is  deceased. He indicated that the status of his sister is unknown. He indicated that the status of his other is unknown.  Social History    Social History   Socioeconomic History   Marital status: Married    Spouse name: Not on file   Number of children: 3   Years of education: Not on file   Highest education level: Not on file  Occupational History   Not on file  Tobacco Use   Smoking status: Never   Smokeless tobacco: Never  Substance and Sexual Activity   Alcohol use: Yes    Alcohol/week: 0.0 standard drinks of alcohol    Comment: occ   Drug use: No   Sexual activity: Not on file  Other Topics Concern   Not on file  Social History  Narrative   Not on file   Social Determinants of Health   Financial Resource Strain: Not on file  Food Insecurity: Not on file  Transportation Needs: Not on file  Physical Activity: Not on file  Stress: Not on file  Social Connections: Not on file  Intimate Partner Violence: Not on file     Review of Systems    General:  No chills, fever, night sweats or weight changes.  Cardiovascular:  No chest pain, dyspnea on exertion, edema, orthopnea, palpitations, paroxysmal nocturnal dyspnea. Dermatological: No rash, lesions/masses Respiratory: No cough, dyspnea Urologic: No hematuria, dysuria Abdominal:   No nausea, vomiting, diarrhea, bright red blood per rectum, melena, or hematemesis Neurologic:  No visual changes, wkns, changes in mental status. All other systems reviewed and are otherwise negative except as noted above.  Physical Exam    VS:  BP 128/76   Pulse 60   Ht 6' (1.829 m)   Wt 213 lb 9.6 oz (96.9 kg)   SpO2 95%   BMI 28.97 kg/m  , BMI Body mass index is 28.97 kg/m. GEN: Well nourished, well developed, in no acute distress. HEENT: normal. Neck: Supple, no JVD, carotid bruits, or masses. Cardiac: RRR, no murmurs, rubs, or gallops. No clubbing, cyanosis, edema.  Radials/DP/PT 2+ and equal bilaterally.  Respiratory:   Respirations regular and unlabored, clear to auscultation bilaterally. GI: Soft, nontender, nondistended, BS + x 4. MS: no deformity or atrophy. Skin: warm and dry, no rash. Neuro:  Strength and sensation are intact. Psych: Normal affect.  Accessory Clinical Findings    Recent Labs: 04/22/2021: ALT 21 11/26/2021: Magnesium 2.1 12/06/2021: BUN 18; Creatinine, Ser 1.02; Hemoglobin 13.7; Platelets 352; Potassium 4.0; Sodium 136   Recent Lipid Panel    Component Value Date/Time   CHOL 101 04/22/2021 1102   TRIG 58 04/22/2021 1102   HDL 36 (L) 04/22/2021 1102   CHOLHDL 2.8 04/22/2021 1102   CHOLHDL 4.5 01/22/2019 0307   VLDL 9 01/22/2019 0307   LDLCALC 52 04/22/2021 1102    ECG personally reviewed by me today-none today.  Echocardiogram 05/12/2021 IMPRESSIONS     1. Left ventricular ejection fraction, by estimation, is 45 to 50%. The  left ventricle has mildly decreased function. The left ventricle  demonstrates regional wall motion abnormalities (see scoring  diagram/findings for description). There is severe  concentric left ventricular hypertrophy. Indeterminate diastolic filling  due to E-A fusion. Wall motion normal at the apex and then progressively  hypokinetic toward the base . The average left ventricular global  longitudinal strain is -13.7 %. The global   longitudinal strain is abnormal.   2. Right ventricular systolic function is normal. The right ventricular  size is mildly enlarged. Moderately increased right ventricular wall  thickness.   3. Left atrial size was severely dilated.   4. Right atrial size was moderately dilated.   5. The mitral valve is grossly normal. Mild mitral valve regurgitation.   6. The aortic valve is tricuspid. Aortic valve regurgitation is not  visualized. No aortic stenosis is present.   7. The inferior vena cava is dilated in size with <50% respiratory  variability, suggesting right atrial pressure of 15 mmHg.   Comparison(s): Prior  images reviewed side by side. Changes from prior  study are noted. 05/01/19 EF 57%.   Conclusion(s)/Recommendation(s): EF decreased compared to prior, preserved  wall motion at the apex and diminished at the base.   Assessment & Plan   1.  Chest discomfort-presented to the  emergency department on 12/06/2021 with complaints of intermittent periods of chest discomfort over the past few days.  His titers or [facility] spotted fever were negative.  Ruled out for PE and ACS.  He was diagnosed with Lyme's disease by Dr. Sarajane Jews.  Responding well to doxycycline treatment. Continue doxycycline Follow-up with PCP  Cardiac amyloidosis-no increased DOE or activity intolerance.  Echocardiogram 05/01/2019 showed severe concentric left ventricular hypertrophy EF 57% and G3 DD.  He underwent PYP scan on 03/10/2020 which was strongly suggestive of amyloidosis.  He was referred to hematology for work-up which was unremarkable and not suggestive for AL amyloidosis.  His genetic testing was negative. Continue tafamidis Heart healthy low-sodium diet Increase physical activity as tolerated  Combined systolic and diastolic CHF-slowly increasing physical activity after diagnosis of Lyme disease.  Responding well to doxycycline treatment.  Previous coronary CTA showed nonobstructive CAD. Continue Entresto, carvedilol, tafamidis  Coronary artery disease-no chest pain today.  CT 9/20 showed a calcium score of 0 with noncalcified plaque causing around 50% stenosis of his RCA with an FFR of 0.88. Continue atorvastatin, aspirin, carvedilol  Hyperlipidemia-LDL 52 on 04/22/2021. Continue aspirin, atorvastatin Heart healthy low-sodium high-fiber diet  Essential hypertension-BP today 128/76.  Well-controlled at home. Continue carvedilol, Entresto Heart healthy low-sodium diet Continue to increase physical activity as tolerated  Disposition: Follow-up with Dr. Gardiner Rhyme in  September.   Jossie Ng. Darrin Apodaca NP-C      12/16/2021, 9:19 AM [address] [place] [address] Office [phone] Fax [phone]  Notice: This dictation was prepared with Dragon dictation along with smaller phrase technology. Any transcriptional errors that result from this process are unintentional and may not be corrected upon review.  I spent 14 minutes examining this patient, reviewing medications, and using patient centered shared decision making involving her cardiac care.  Prior to her visit I spent greater than 20 minutes reviewing her past medical history,  medications, and prior cardiac tests.

## 2021-12-16 ENCOUNTER — Ambulatory Visit: Payer: BC Managed Care – PPO | Admitting: General Practice

## 2021-12-16 ENCOUNTER — Encounter: Payer: Self-pay | Admitting: General Practice

## 2021-12-16 VITALS — BP 128/76 | HR 60 | Ht 72.0 in | Wt 213.6 lb

## 2021-12-16 DIAGNOSIS — E785 Hyperlipidemia, unspecified: Secondary | ICD-10-CM

## 2021-12-16 DIAGNOSIS — E854 Organ-limited amyloidosis: Secondary | ICD-10-CM

## 2021-12-16 DIAGNOSIS — I2510 Atherosclerotic heart disease of native coronary artery without angina pectoris: Secondary | ICD-10-CM

## 2021-12-16 DIAGNOSIS — I43 Cardiomyopathy in diseases classified elsewhere: Secondary | ICD-10-CM

## 2021-12-16 DIAGNOSIS — R0789 Other chest pain: Secondary | ICD-10-CM

## 2021-12-16 DIAGNOSIS — I5042 Chronic combined systolic (congestive) and diastolic (congestive) heart failure: Secondary | ICD-10-CM

## 2021-12-16 DIAGNOSIS — I10 Essential (primary) hypertension: Secondary | ICD-10-CM

## 2021-12-16 NOTE — Patient Instructions (Signed)
Medication Instructions:  Your physician recommends that you continue on your current medications as directed. Please refer to the Current Medication list given to you today.  *If you need a refill on your cardiac medications before your next appointment, please call your pharmacy*  Lab Work: NONE ordered at this time of appointment   If you have labs (blood work) drawn today and your tests are completely normal, you will receive your results only by: [place] (if you have MyChart) OR A paper copy in the mail If you have any lab test that is abnormal or we need to change your treatment, we will call you to review the results.  Testing/Procedures: NONE ordered at this time of appointment   Follow-Up: At [facility], you and your health needs are our priority.  As part of our continuing mission to provide you with exceptional heart care, we have created designated Provider Care Teams.  These Care Teams include your primary Cardiologist (physician) and Advanced Practice Providers (APPs -  Physician Assistants and Nurse Practitioners) who all work together to provide you with the care you need, when you need it.   Your next appointment:   As previously scheduled    The format for your next appointment:   In Person  Provider:   Donato Heinz, MD    Other Instructions Log Blood pressure at home 2 times a week. Maintain physical activity  Monitor for chest discomfort and increased work of breathing   Important Information About Sugar

## 2022-02-01 ENCOUNTER — Ambulatory Visit: Payer: BC Managed Care – PPO | Admitting: Cardiology

## 2022-02-01 DIAGNOSIS — D225 Melanocytic nevi of trunk: Secondary | ICD-10-CM | POA: Diagnosis not present

## 2022-02-01 DIAGNOSIS — L570 Actinic keratosis: Secondary | ICD-10-CM | POA: Diagnosis not present

## 2022-02-01 DIAGNOSIS — Z85828 Personal history of other malignant neoplasm of skin: Secondary | ICD-10-CM | POA: Diagnosis not present

## 2022-02-01 DIAGNOSIS — L821 Other seborrheic keratosis: Secondary | ICD-10-CM | POA: Diagnosis not present

## 2022-02-01 DIAGNOSIS — C44319 Basal cell carcinoma of skin of other parts of face: Secondary | ICD-10-CM | POA: Diagnosis not present

## 2022-02-01 DIAGNOSIS — L723 Sebaceous cyst: Secondary | ICD-10-CM | POA: Diagnosis not present

## 2022-03-08 DIAGNOSIS — C44319 Basal cell carcinoma of skin of other parts of face: Secondary | ICD-10-CM | POA: Diagnosis not present

## 2022-04-06 DIAGNOSIS — H401131 Primary open-angle glaucoma, bilateral, mild stage: Secondary | ICD-10-CM | POA: Diagnosis not present

## 2022-04-06 DIAGNOSIS — H2511 Age-related nuclear cataract, right eye: Secondary | ICD-10-CM | POA: Diagnosis not present

## 2022-04-19 ENCOUNTER — Other Ambulatory Visit: Payer: Self-pay | Admitting: Cardiology

## 2022-04-24 NOTE — Progress Notes (Unsigned)
Cardiology Office Note:    Date:  11/02/2021   ID:  Andrew Vaughn, DOB [birth year], MRN [id number]  PCP:  [name], MD  Cardiologist:  Donato Heinz, MD  Electrophysiologist:  None   Referring MD: [name], MD    Chief complaint: heart failure  History of Present Illness:    Andrew Vaughn is a 66 y.o. male with a hx of hypertension and glaucoma who presents for follow-up.  He was admitted to [facility] from 01/20/2022 to 01/24/2019.  He had presented with shortness of breath for the few months prior to admission.  In addition, he has been having intermittent fevers throughout the month of July, for which he completed a 9-day course of doxycycline.  Fevers resolved but he continued to have shortness of breath prompting his ED visit.  TTE on 01/22/2020 showed EF 40 to 45%, mild LVH, grade 3 diastolic dysfunction.  He was also found to have a PE.  Coronary CT showed nonobstructive CAD.  He was started on Entresto and carvedilol.  Repeat TTE on 05/01/2019 showed normalization of LV function, but severe LVH and grade 3 diastolic dysfunction.  PYP scan consistent with cardiac amyloidosis.  Echocardiogram on 05/12/2021 showed EF 45 to 50%, severe LVH, normal RV function, severe left atrial dilatation, moderate right atrial dilatation, no significant valvular disease.  Since last clinic visit,  he reports that he is doing well.  Denies any chest pain, dyspnea, lightheadedness, syncope, lower extremity edema, or palpitations.  Walking at least 30 minutes 4 days/week.  Denies any exertional symptoms.   Wt Readings from Last 3 Encounters:  11/02/21 227 lb (103 kg)  06/04/21 226 lb (102.5 kg)  04/22/21 222 lb (100.7 kg)      Past Medical History:  Diagnosis Date   Glaucoma    per Dr. Ellie Lunch   Hypertension     Past Surgical History:  Procedure Laterality Date   dislocated left shoulder  1993   right knee giant cell bone tumor  1998   per Dr. Leonides Schanz at [facility]    ruptured lumbar disc  1976   torn meniscus left knee  8/08   Dr. French Ana    Current Medications: Current Meds  Medication Sig   Ascorbic Acid (VITAMIN C) 1000 MG tablet Take 1,000 mg by mouth daily.   aspirin EC 81 MG tablet Take 1 tablet (81 mg total) by mouth daily. Swallow whole.   atorvastatin (LIPITOR) 40 MG tablet TAKE 1 TABLET(40 MG) BY MOUTH DAILY AT 6 PM   carvedilol (COREG) 12.5 MG tablet TAKE 1 TABLET(12.5 MG) BY MOUTH TWICE DAILY WITH A MEAL   ENTRESTO 49-51 MG TAKE 1 TABLET BY MOUTH TWICE DAILY   furosemide (LASIX) 40 MG tablet TAKE 1 TABLET(40 MG) BY MOUTH DAILY   Multiple Vitamin (MULTIVITAMIN) capsule Take 1 capsule by mouth daily.   travoprost, benzalkonium, (TRAVATAN) 0.004 % ophthalmic solution Place 1 drop into both eyes daily.   VYNDAMAX 61 MG CAPS TAKE 1 CAPSULE ONCE DAILY     Allergies:   Shrimp [shellfish allergy], Erythromycin, and Penicillins   Social History   Socioeconomic History   Marital status: Married    Spouse name: Not on file   Number of children: 3   Years of education: Not on file   Highest education level: Not on file  Occupational History   Not on file  Tobacco Use   Smoking status: Never   Smokeless tobacco: Never  Substance and Sexual  Activity   Alcohol use: Yes    Alcohol/week: 0.0 standard drinks of alcohol    Comment: occ   Drug use: No   Sexual activity: Not on file  Other Topics Concern   Not on file  Social History Narrative   Not on file   Social Determinants of Health   Financial Resource Strain: Not on file  Food Insecurity: Not on file  Transportation Needs: Not on file  Physical Activity: Not on file  Stress: Not on file  Social Connections: Not on file     Family History: The patient's family history includes Atrial fibrillation in his father; CAD in his father; Dementia in his mother; Glaucoma in an other family member; Heart disease in an other family member; Heart failure in his father; Hypertension in an  other family member; Hypothyroidism in his sister; Stroke in an other family member.  ROS:   Please see the history of present illness.     All other systems reviewed and are negative.  EKGs/Labs/Other Studies Reviewed:    The following studies were reviewed today:   EKG:   11/02/2021: Sinus rhythm, first-degree AV block, rate 60, right axis deviation, Q waves in V1-3, I/aVL, T wave inversions in inferior leads 04/22/21: Sinus rhythm, first-degree AV block, rate 67, right axis deviation, Q waves in V1-4, I, aVL, T wave inversions in leads III, aVF  CTA chest 01/21/19: 1. Very tiny nonocclusive pulmonary embolus involving the right lower lobe as detailed above. 2. Overall findings most concerning for congestive heart failure including moderate-sized bilateral pleural effusions. 3. Mediastinal adenopathy, presumably reactive. 4. There is a 1 cm rounded nodular opacity involving the right lower lobe as detailed above. This may represent a focus of atelectasis, infiltrate, or pulmonary nodule. A three-month follow-up CT of the chest is recommended to confirm resolution of this finding. 5. Dilated ascending aorta measuring 4.2 cm. Recommend annual imaging followup by CTA or MRA. This recommendation follows 2010 ACCF/AHA/AATS/ACR/ASA/SCA/SCAI/SIR/STS/SVM Guidelines for the Diagnosis and Management of Patients with Thoracic Aortic Disease. Circulation. 2010; 121: Y782-N562. Aortic aneurysm NOS (ICD10-I71.9)  TTE 01/22/19:  1. The left ventricle has mild-moderately reduced systolic function, with an ejection fraction of 40-45%. The cavity size was normal. There is mild concentric left ventricular hypertrophy. Left ventricular diastolic Doppler parameters are consistent  with restrictive filling. Left ventrical global hypokinesis without regional wall motion abnormalities.  2. The right ventricle has mildly reduced systolic function. The cavity was mildly enlarged. There is no increase in right  ventricular wall thickness. Right ventricular systolic pressure is mildly elevated with an estimated pressure of 41.0 mmHg.  3. Left atrial size was mildly dilated.  4. Right atrial size was mildly dilated.  5. Mitral valve regurgitation is mild to moderate by color flow Doppler. The MR jet is centrally-directed.  6. The aorta is normal unless otherwise noted.  7. The inferior vena cava was dilated in size with <50% respiratory variability.  Coronary CTA 01/23/19: 1. Coronary calcium score of 0. This was 0 percentile for age and sex matched control. 2. Normal coronary origin with right dominance. 3. Nonobstructive CAD. There is noncalcified plaque in the proximal RCA causing ~50% stenosis. CTFFR across this lesion is 0.88, suggesting lesion is not functionally significant. CAD-RADS 3. Moderate stenosis. Consider symptom-guided anti-ischemic pharmacotherapy as well as risk factor modification per guideline directed care. Additional analysis with CT FFR will be submitted.   TTE 05/01/19:  1. In the setting of severe concentric left ventricular hypertrophy, pattern  of restrictive diastolic filling, and increased RV wall thickeness, cannot exclude a diagnosis of cardiac amyloidosis or other infiltrative process.  2. Left ventricular ejection fraction, by 3D calculation, is 57%. The left ventricle has normal function. There is severely increased left ventricular hypertrophy.  3. Left ventricular diastolic parameters are consistent with Grade III diastolic dysfunction (restrictive).  4. Global right ventricle has normal systolic function.The right ventricular size is normal. Moderately increased right ventricular wall thickness.  5. Left atrial size was moderately dilated.  6. Right atrial size was mildly dilated.  7. The mitral valve is grossly normal. No evidence of mitral valve regurgitation. Mild mitral stenosis.  8. The tricuspid valve is normal in structure. Tricuspid valve regurgitation is  trivial.  9. The aortic valve is tricuspid. Aortic valve regurgitation is trivial. No evidence of aortic valve sclerosis or stenosis. 10. The pulmonic valve was normal in structure. Pulmonic valve regurgitation is trivial. 11. Normal pulmonary artery systolic pressure. 12. The inferior vena cava is normal in size with <50% respiratory variability, suggesting right atrial pressure of 8 mmHg. 13. The left ventricle has no regional wall motion abnormalities.  Recent Labs: 04/22/2021: ALT 21; BUN 17; Creatinine, Ser 0.98; Hemoglobin 14.3; Platelets 173; Potassium 4.5; Sodium 141  Recent Lipid Panel    Component Value Date/Time   CHOL 101 04/22/2021 1102   TRIG 58 04/22/2021 1102   HDL 36 (L) 04/22/2021 1102   CHOLHDL 2.8 04/22/2021 1102   CHOLHDL 4.5 01/22/2019 0307   VLDL 9 01/22/2019 0307   LDLCALC 52 04/22/2021 1102    Physical Exam:    VS:  BP 128/80   Pulse 60   Ht '5\' 11"'$  (1.803 m)   Wt 227 lb (103 kg)   SpO2 97%   BMI 31.66 kg/m     Wt Readings from Last 3 Encounters:  11/02/21 227 lb (103 kg)  06/04/21 226 lb (102.5 kg)  04/22/21 222 lb (100.7 kg)     GEN: Well nourished, well developed in no acute distress HEENT: Normal NECK: No JVD CARDIAC: RRR, no murmurs, rubs, gallops RESPIRATORY:  Clear to auscultation without rales, wheezing or rhonchi  ABDOMEN: Soft, non-tender, non-distended MUSCULOSKELETAL:  Trace LE edema SKIN: Warm and dry NEUROLOGIC:  Alert and oriented x 3 PSYCHIATRIC:  Normal affect   ASSESSMENT:    1. Cardiac amyloidosis ([place])   2. Chronic combined systolic and diastolic heart failure ([place])   3. Essential hypertension   4. Coronary artery disease involving native coronary artery of native heart without angina pectoris   5. Hyperlipidemia, unspecified hyperlipidemia type      PLAN:     Cardiac amyloidosis: severe concentric hypertrophy and grade 3 diastolic dysfunction noted on echo. Unable to get MRI due to claustrophobia.  PYP scan on  03/10/2020 strongly suggestive of amyloidosis.  SPEP/UPEP unremarkable but abnormal light chains (kappa/lambda ratio 1.8).  Referred to hematology, work-up unremarkable, not suggestive of AL amyloidosis.  Findings consistent with ATTR amyloidosis.  Genetic testing was negative. -Continue tafamidis 61 mg daily  Chronic combined systolic and diastolic heart failure: Echo 01/23/19 with EF 40-45%, G3DD, diffuse LV hypokinesis without RWMA, mild concentric LV hypertrophy, mildly reduced RV systolic dysfunction, and mild-moderate MR.  Repeat Echo 05/01/19 showed normalization of LV systolic function, but severe concentric LVH.  Coronary CTA with nonobstructive CAD.    PYP scan consistent with amyloidosis as above.  Echocardiogram on 05/12/2021 showed EF 45 to 50%, severe LVH, normal RV function, severe left atrial dilatation, moderate right  atrial dilatation, no significant valvular disease. - Appears euvolemic.  Continue Lasix 40 mg daily as needed if gains more than 3 pounds in 1 day or 5 pounds in 1 week -Continue Farxiga 10 mg daily -Continue spironolactone 12.5 mg - Continue Entresto 49-51 mg.   - Continue carvedilol 12.5 mg BID - Continue tafamidis for cardiac amyloidosis as above -***Echo   Nonobstructive CAD: CT 01/2019 showed calcium score 0, but noncalcified plaque causing ~50% RCA stenosis (CTFFR 0.88, suggesting lesion is not functionally significant).  Denies any anginal symptoms - Atorvastatin 40 mg daily - Aspirin 81 mg daily   Hypertension: Appears controlled -Continue Entresto and carvedilol   HLD: LDL 52 on 04/22/2021, on atorvastatin 40 mg daily   Thoracic aortic dilatation noted to have a 4.2cm dilation of the ascending aorta.  Repeat CTA chest on 03/09/2020 showed 3.9 cm ascending aorta.  Pulmonary embolism/left lower extremity DVT: Diagnosed during hospitalization on 01/21/2019.  Started on Eliquis 5 mg twice daily.  PE thought to be provoked, due to sedentary lifestyle (working long  hours as Optometrist, sitting at desk all day).  Completed 33-monthcourse of anticoagulation, have discontinued  Pulmonary nodule: 1 cm rounded nodular opacity involving the right lower lobe noted on CT chest 01/21/2019, which could represent atelectasis, infiltrate, or pulmonary nodule.  Follow-up CT on 05/14/2019 showed resolution of nodule  RTC in 3 months   Medication Adjustments/Labs and Tests Ordered: Current medicines are reviewed at length with the patient today.  Concerns regarding medicines are outlined above.  Orders Placed This Encounter  Procedures   Basic metabolic panel   Magnesium   EKG 12-Lead     No orders of the defined types were placed in this encounter.    Patient Instructions  Medication Instructions:  Your physician recommends that you continue on your current medications as directed. Please refer to the Current Medication list given to you today.  *If you need a refill on your cardiac medications before your next appointment, please call your pharmacy*   Lab Work: BMET, [place]  If you have labs (blood work) drawn today and your tests are completely normal, you will receive your results only by: [place](if you have MyChart) OR A paper copy in the mail If you have any lab test that is abnormal or we need to change your treatment, we will call you to review the results.  Follow-Up: [facility] and your [facility] needs are our priority.  As part of our continuing mission to provide you with exceptional heart care, we have created designated Provider Care Teams.  These Care Teams include your primary Cardiologist (physician) and Advanced Practice Providers (APPs -  Physician Assistants and Nurse Practitioners) who all work together to provide you with the care you need, when you need it.  We recommend signing up for the patient portal called "MyChart".  Sign up information is provided on this After Visit Summary.  MyChart is used to  connect with patients for Virtual Visits (Telemedicine).  Patients are able to view lab/test results, encounter notes, upcoming appointments, etc.  Non-urgent messages can be sent to your provider as well.   To learn more about what you can do with MyChart, go to [email or web address]    Your next appointment:   3 month(s)  The format for your next appointment:   In Person  Provider:   CDonato Heinz MD {   Important Information About Sugar  Signed, Donato Heinz, MD  11/02/2021 9:57 AM    [facility]

## 2022-04-26 ENCOUNTER — Ambulatory Visit: Payer: BC Managed Care – PPO | Attending: Cardiology | Admitting: Cardiology

## 2022-04-26 ENCOUNTER — Encounter: Payer: Self-pay | Admitting: Cardiology

## 2022-04-26 VITALS — BP 128/60 | HR 57 | Ht 72.0 in | Wt 226.0 lb

## 2022-04-26 DIAGNOSIS — E785 Hyperlipidemia, unspecified: Secondary | ICD-10-CM | POA: Diagnosis not present

## 2022-04-26 DIAGNOSIS — I5042 Chronic combined systolic (congestive) and diastolic (congestive) heart failure: Secondary | ICD-10-CM

## 2022-04-26 DIAGNOSIS — I10 Essential (primary) hypertension: Secondary | ICD-10-CM

## 2022-04-26 DIAGNOSIS — I43 Cardiomyopathy in diseases classified elsewhere: Secondary | ICD-10-CM

## 2022-04-26 DIAGNOSIS — E854 Organ-limited amyloidosis: Secondary | ICD-10-CM

## 2022-04-26 DIAGNOSIS — I2510 Atherosclerotic heart disease of native coronary artery without angina pectoris: Secondary | ICD-10-CM

## 2022-04-26 NOTE — Patient Instructions (Signed)
Medication Instructions:  Your physician recommends that you continue on your current medications as directed. Please refer to the Current Medication list given to you today.  *If you need a refill on your cardiac medications before your next appointment, please call your pharmacy*   Lab Work: BMET, Mag, Lipid today  If you have labs (blood work) drawn today and your tests are completely normal, you will receive your results only by: [place] (if you have MyChart) OR A paper copy in the mail If you have any lab test that is abnormal or we need to change your treatment, we will call you to review the results.   Testing/Procedures: Your physician has requested that you have an echocardiogram. Echocardiography is a painless test that uses sound waves to create images of your heart. It provides your doctor with information about the size and shape of your heart and how well your heart's chambers and valves are working. This procedure takes approximately one hour. There are no restrictions for this procedure. Please do NOT wear cologne, perfume, aftershave, or lotions (deodorant is allowed). Please arrive 15 minutes prior to your appointment time.  Follow-Up: At [facility], you and your health needs are our priority.  As part of our continuing mission to provide you with exceptional heart care, we have created designated Provider Care Teams.  These Care Teams include your primary Cardiologist (physician) and Advanced Practice Providers (APPs -  Physician Assistants and Nurse Practitioners) who all work together to provide you with the care you need, when you need it.  We recommend signing up for the patient portal called "MyChart".  Sign up information is provided on this After Visit Summary.  MyChart is used to connect with patients for Virtual Visits (Telemedicine).  Patients are able to view lab/test results, encounter notes, upcoming appointments, etc.  Non-urgent messages can  be sent to your provider as well.   To learn more about what you can do with MyChart, go to [email or web address].    Your next appointment:   6 month(s)  The format for your next appointment:   In Person  Provider:   Donato Heinz, MD

## 2022-04-27 LAB — BASIC METABOLIC PANEL
BUN/Creatinine Ratio: 16 (ref 10–24)
BUN: 17 mg/dL (ref 8–27)
CO2: 25 mmol/L (ref 20–29)
Calcium: 10 mg/dL (ref 8.6–10.2)
Chloride: 95 mmol/L — ABNORMAL LOW (ref 96–106)
Creatinine, Ser: 1.09 mg/dL (ref 0.76–1.27)
Glucose: 88 mg/dL (ref 70–99)
Potassium: 4.4 mmol/L (ref 3.5–5.2)
Sodium: 143 mmol/L (ref 134–144)
eGFR: 75 mL/min/{1.73_m2} (ref >59)

## 2022-04-27 LAB — LIPID PANEL
Chol/HDL Ratio: 3.9 ratio (ref 0.0–5.0)
Cholesterol, Total: 174 mg/dL (ref 100–199)
HDL: 45 mg/dL (ref >39)
LDL Chol Calc (NIH): 109 mg/dL — ABNORMAL HIGH (ref 0–99)
Triglycerides: 113 mg/dL (ref 0–149)
VLDL Cholesterol Cal: 20 mg/dL (ref 5–40)

## 2022-04-27 LAB — MAGNESIUM: Magnesium: 2.2 mg/dL (ref 1.6–2.3)

## 2022-05-16 ENCOUNTER — Other Ambulatory Visit: Payer: Self-pay | Admitting: Cardiology

## 2022-05-25 ENCOUNTER — Ambulatory Visit (HOSPITAL_COMMUNITY): Payer: BC Managed Care – PPO | Attending: Cardiology

## 2022-05-25 DIAGNOSIS — I5042 Chronic combined systolic (congestive) and diastolic (congestive) heart failure: Secondary | ICD-10-CM | POA: Diagnosis not present

## 2022-05-25 LAB — ECHOCARDIOGRAM COMPLETE
Area-P 1/2: 2.95 cm2
S' Lateral: 2.7 cm

## 2022-06-10 ENCOUNTER — Other Ambulatory Visit: Payer: Self-pay | Admitting: *Deleted

## 2022-06-10 DIAGNOSIS — E785 Hyperlipidemia, unspecified: Secondary | ICD-10-CM

## 2022-06-28 IMAGING — CT CT ANGIO CHEST
3 of 8 series · 19 of 46 positions shown · IV contrast (OMNIPAQUE 350)
Comparison: 01/23/2019

CLINICAL DATA: Follow-up dilated aorta

EXAM:
CT ANGIOGRAPHY CHEST WITH CONTRAST
TECHNIQUE: Multidetector CT imaging of the chest was performed using the
standard protocol during bolus administration of intravenous
contrast. Multiplanar CT image reconstructions and MIPs were
obtained to evaluate the vascular anatomy.
CONTRAST:  100mL OMNIPAQUE IOHEXOL 350 MG/ML SOLN

[Series 4: aorta 3.0 bf37 2 · axial · 0.75mm/px · z∈[-408,-84]mm · 13 of 126 slices shown]
[im 9/126  lung]
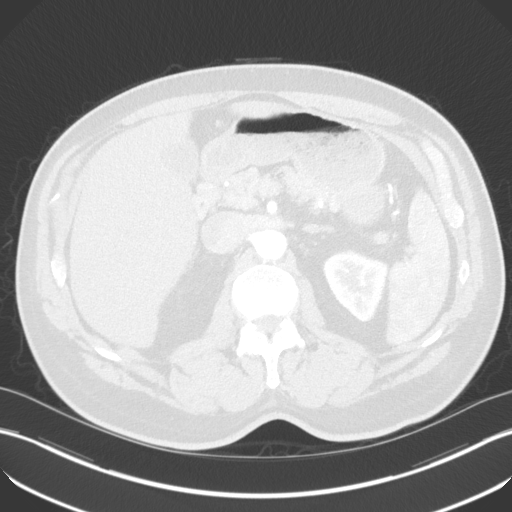
[im 18/126  soft-tissue]
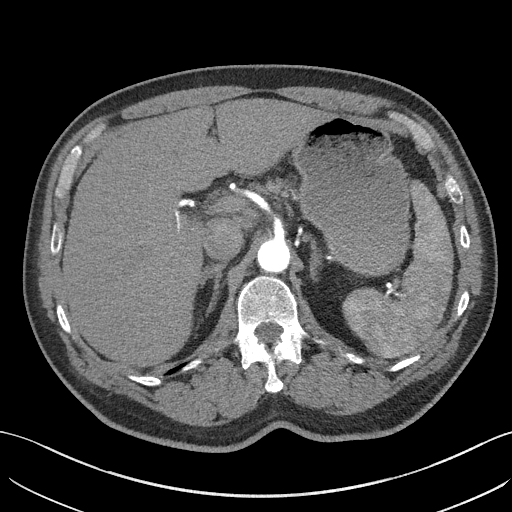
[im 27/126  lung]
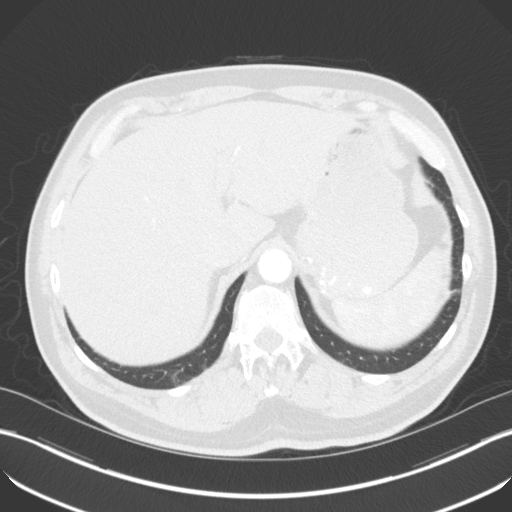
[im 36/126  soft-tissue]
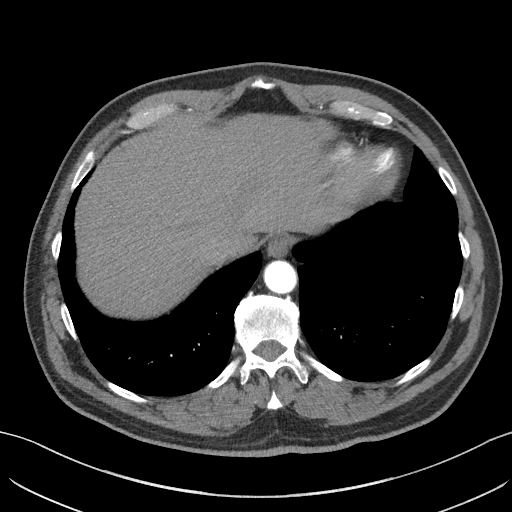
[im 45/126  lung]
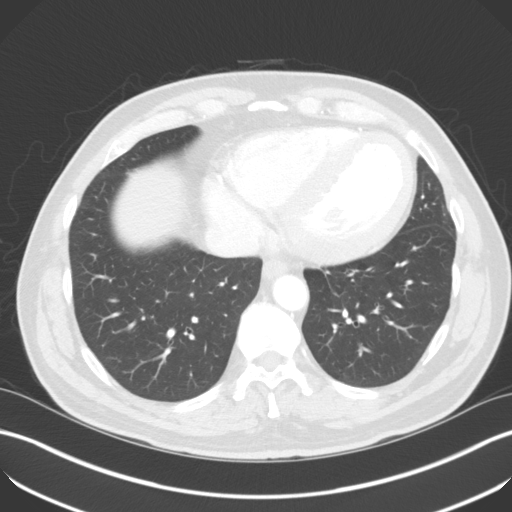
[im 54/126  soft-tissue]
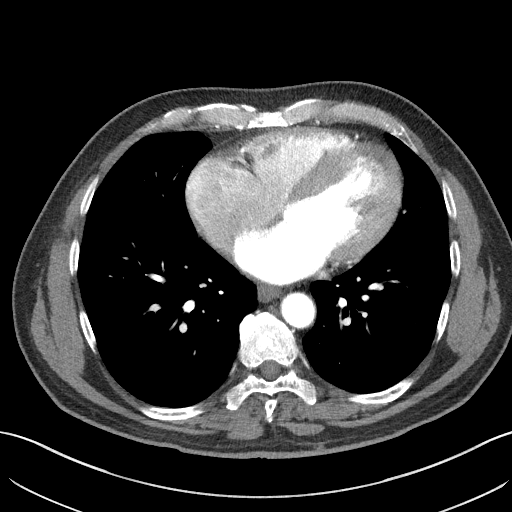
[im 63/126  lung]
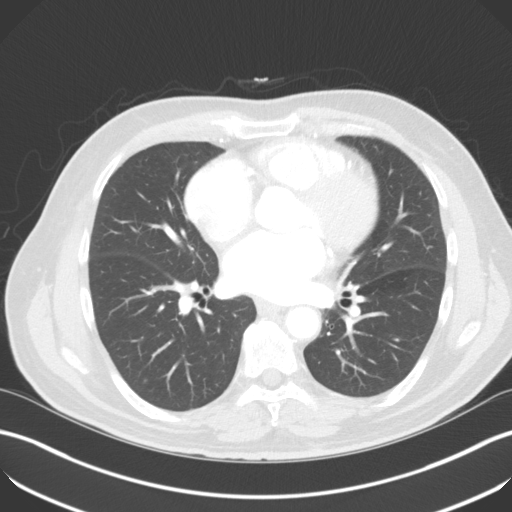
[im 72/126  soft-tissue]
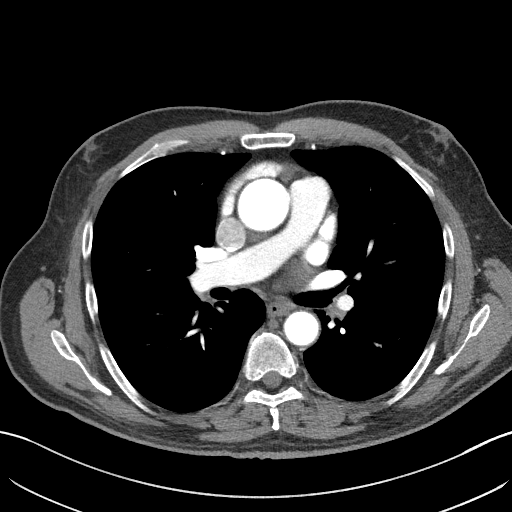
[im 81/126  lung]
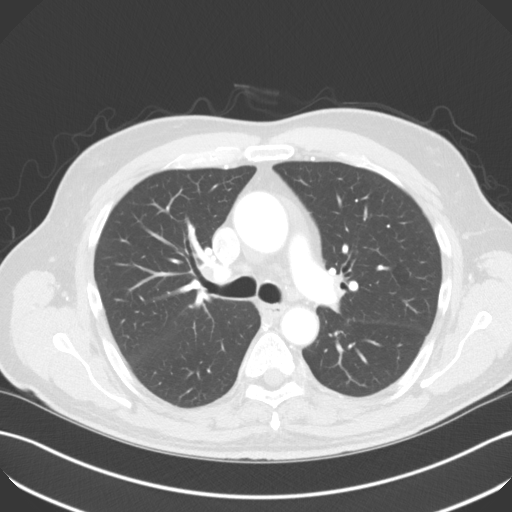
[im 90/126  soft-tissue]
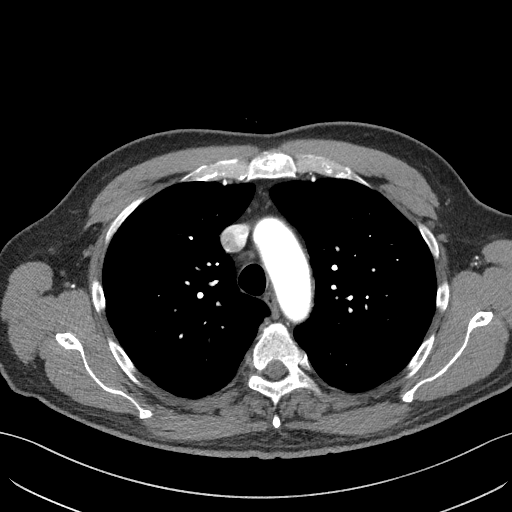
[im 99/126  lung]
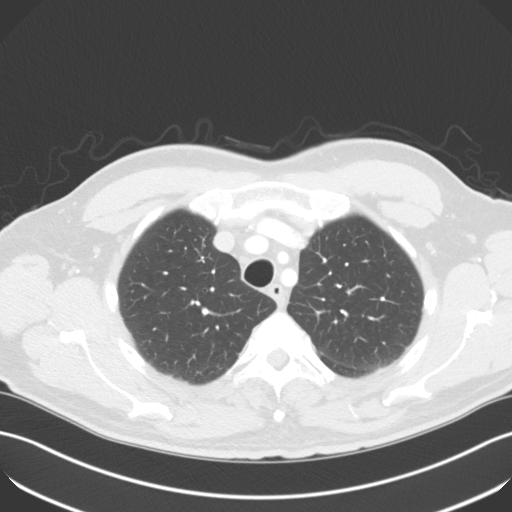
[im 108/126  soft-tissue]
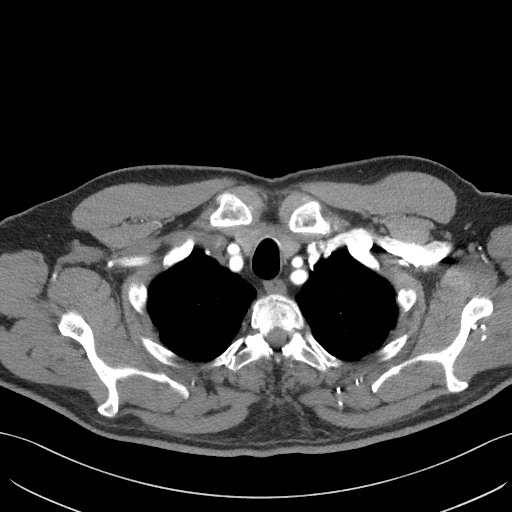
[im 117/126  lung]
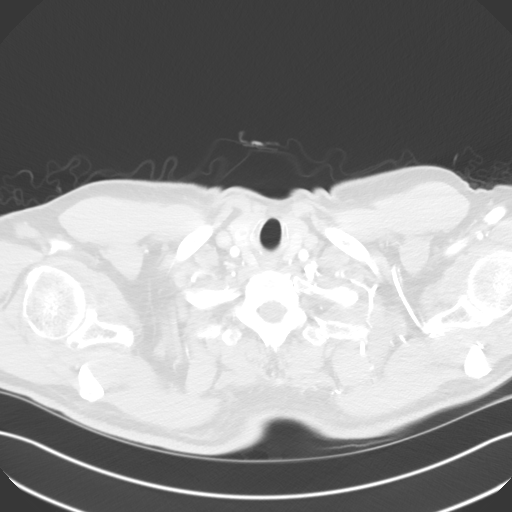

[Series 5: lung · axial · 0.75mm/px · z∈[-408,-300]mm · 3 of 126 slices shown]
[im 9/126  soft-tissue]
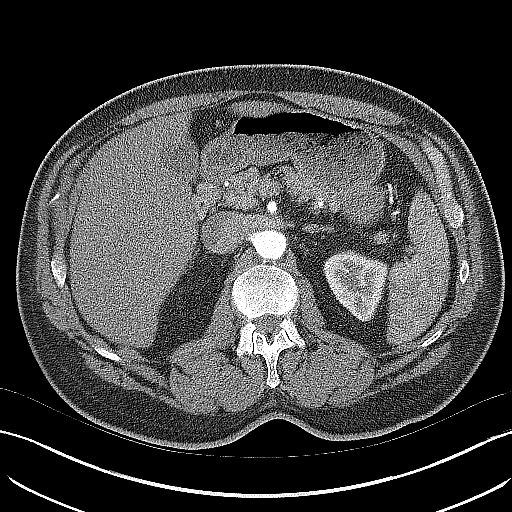
[im 27/126  soft-tissue]
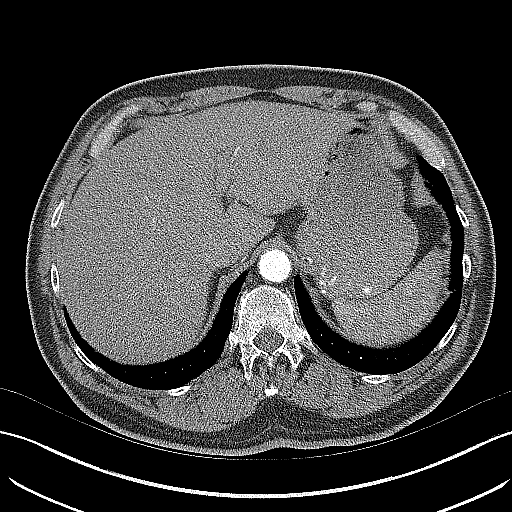
[im 45/126  soft-tissue]
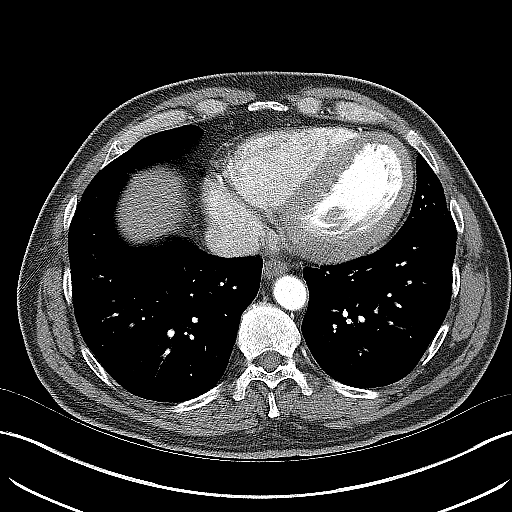

[Series 7: coronals · coronal · 0.72mm/px · 3 of 129 slices shown]
[im 33/129  soft-tissue]
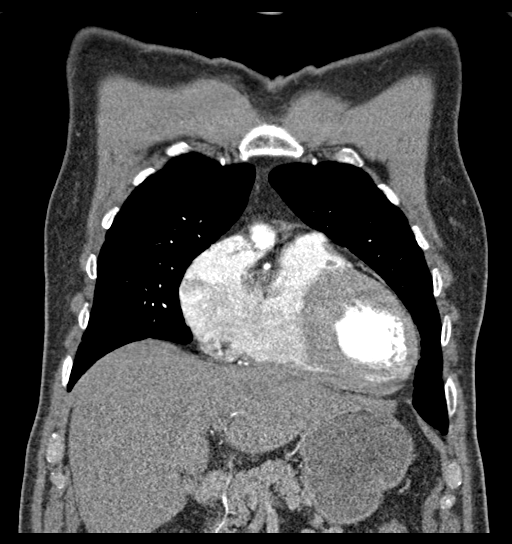
[im 65/129  soft-tissue]
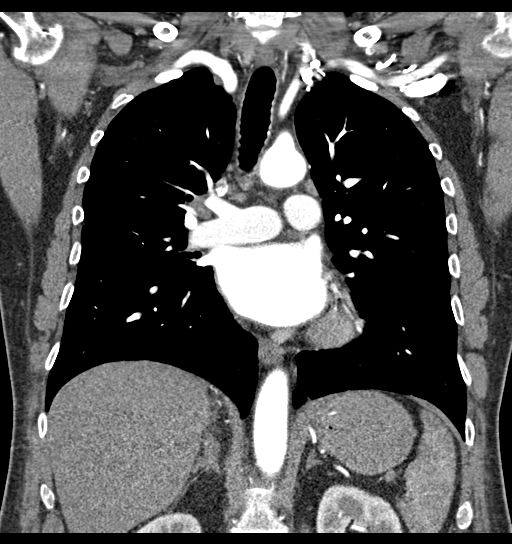
[im 97/129  soft-tissue]
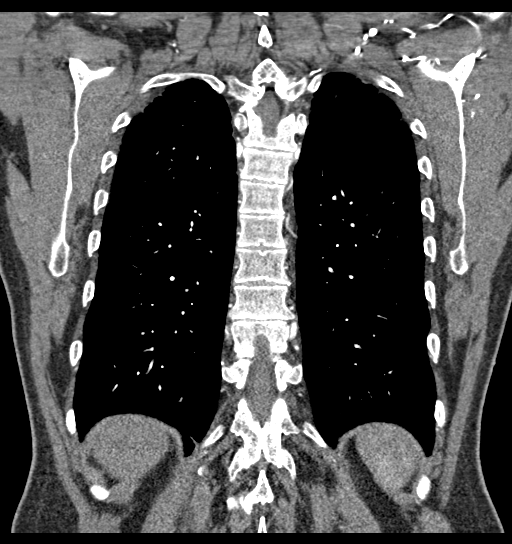

[19 of 46 positions shown; findings below may reference images not displayed]

FINDINGS: Cardiovascular: Thoracic aorta demonstrates a normal branching
pattern. Ascending aorta is mildly prominent at 3.9 cm stable in
appearance from the prior exam. No evidence of dissection is seen.
No significant atherosclerotic calcifications are noted. Heart is
mildly enlarged but stable. Pulmonary artery shows a normal
branching pattern. No definitive filling defects are seen to suggest
pulmonary emboli.

Mediastinum/Nodes: Thoracic inlet is within normal limits. No
sizable hilar or mediastinal adenopathy is noted. The esophagus as
visualized is within normal limits.

Lungs/Pleura: The lungs are well aerated bilaterally. No focal
infiltrate or sizable effusion is seen. No parenchymal nodules are
noted. No pneumothorax is seen.

Upper Abdomen: No acute abnormality.

Musculoskeletal: Degenerative changes of the thoracic spine are
noted.

Review of the MIP images confirms the above findings.
IMPRESSION: Mild dilatation of the ascending aorta 3.9 cm stable in appearance
from the prior exam.

No other focal abnormality is noted.

## 2022-07-24 ENCOUNTER — Other Ambulatory Visit: Payer: Self-pay | Admitting: Cardiology

## 2022-08-15 ENCOUNTER — Telehealth: Payer: Self-pay | Admitting: Cardiology

## 2022-08-15 NOTE — Telephone Encounter (Signed)
I attempted to contact the speciality pharmacy- all we have on file was [employer], from 2023- I was on hold for a while before disconnecting the call. I did send a mychart message to patient clarifying his insurance on file.   Will route to primary RN and pharmacy team as Juluis Rainier- I will attempt to call again once I know if insurance information changed.   Thanks!

## 2022-08-15 NOTE — Telephone Encounter (Signed)
Pt c/o medication issue:  1. Name of Medication:   VYNDAMAX 61 MG CAPS    2. How are you currently taking this medication (dosage and times per day)?   3. Are you having a reaction (difficulty breathing--STAT)?   4. What is your medication issue?  Caryl Pina from [employer] called in asking what is pt's updated insurance. She ask that someone call her back.

## 2022-08-24 NOTE — Telephone Encounter (Signed)
LMOM for patient to return call regarding insurance update

## 2022-08-28 ENCOUNTER — Other Ambulatory Visit: Payer: Self-pay | Admitting: Cardiology

## 2022-09-20 ENCOUNTER — Telehealth: Payer: Self-pay | Admitting: Cardiology

## 2022-09-20 ENCOUNTER — Other Ambulatory Visit: Payer: Self-pay

## 2022-09-20 MED ORDER — VYNDAMAX 61 MG PO CAPS: 1.0000 | ORAL_CAPSULE | Freq: Every day | ORAL | 1 refills | Status: DC

## 2022-09-20 NOTE — Telephone Encounter (Signed)
*  STAT* If patient is at the pharmacy, call can be transferred to refill team.   1. Which medications need to be refilled? (please list name of each medication and dose if known) VYNDAMAX 61 MG CAPS   2. Which pharmacy/location (including street and city if local pharmacy) is medication to be sent to? [facility] - [place], [place] - [address]   3. Do they need a 30 day or 90 day supply? 90

## 2022-09-22 ENCOUNTER — Other Ambulatory Visit (HOSPITAL_COMMUNITY): Payer: Self-pay

## 2022-09-22 ENCOUNTER — Telehealth: Payer: Self-pay

## 2022-09-22 NOTE — Telephone Encounter (Signed)
Pharmacy Patient Advocate Encounter   Received notification from [employer] that prior authorization for [facility] is needed.    PA submitted on 09/22/22 Key [id number] Status is pending  Haze Rushing, CPhT Pharmacy Patient Advocate Specialist Direct Number: [phone] Fax: [phone]

## 2022-09-23 ENCOUNTER — Other Ambulatory Visit: Payer: Self-pay | Admitting: Cardiology

## 2022-09-23 NOTE — Telephone Encounter (Signed)
Pharmacy Patient Advocate Encounter  Prior Authorization for [facility] has been approved.    Effective dates: 09/22/22 through 09/22/23  Haze Rushing, CPhT Pharmacy Patient Advocate Specialist Direct Number: [phone] Fax: [phone]

## 2022-09-26 ENCOUNTER — Other Ambulatory Visit: Payer: Self-pay | Admitting: Cardiology

## 2022-09-27 NOTE — Telephone Encounter (Signed)
Patient is requesting call back to discuss getting some type of patient assistance for this medication since the cost for him currently would be over $3000. Please advise.

## 2022-09-28 NOTE — Telephone Encounter (Signed)
Enrolled in [facility] ID: [id number]  CARD NO. [id number]   CARD STATUS Active   BIN [id number]   PCN [id number]   PC GROUP [id number]  Card info shred with patient over the phone.

## 2022-10-06 DIAGNOSIS — H0100B Unspecified blepharitis left eye, upper and lower eyelids: Secondary | ICD-10-CM | POA: Diagnosis not present

## 2022-10-06 DIAGNOSIS — H2000 Unspecified acute and subacute iridocyclitis: Secondary | ICD-10-CM | POA: Diagnosis not present

## 2022-10-06 DIAGNOSIS — H16201 Unspecified keratoconjunctivitis, right eye: Secondary | ICD-10-CM | POA: Diagnosis not present

## 2022-10-06 DIAGNOSIS — H0100A Unspecified blepharitis right eye, upper and lower eyelids: Secondary | ICD-10-CM | POA: Diagnosis not present

## 2022-10-06 DIAGNOSIS — H1820 Unspecified corneal edema: Secondary | ICD-10-CM | POA: Diagnosis not present

## 2022-10-06 DIAGNOSIS — H18513 Endothelial corneal dystrophy, bilateral: Secondary | ICD-10-CM | POA: Diagnosis not present

## 2022-10-11 DIAGNOSIS — H401132 Primary open-angle glaucoma, bilateral, moderate stage: Secondary | ICD-10-CM | POA: Diagnosis not present

## 2022-10-11 DIAGNOSIS — H5213 Myopia, bilateral: Secondary | ICD-10-CM | POA: Diagnosis not present

## 2022-10-11 DIAGNOSIS — H01005 Unspecified blepharitis left lower eyelid: Secondary | ICD-10-CM | POA: Diagnosis not present

## 2022-10-11 DIAGNOSIS — H2511 Age-related nuclear cataract, right eye: Secondary | ICD-10-CM | POA: Diagnosis not present

## 2022-10-11 DIAGNOSIS — H01002 Unspecified blepharitis right lower eyelid: Secondary | ICD-10-CM | POA: Diagnosis not present

## 2022-10-11 DIAGNOSIS — H35371 Puckering of macula, right eye: Secondary | ICD-10-CM | POA: Diagnosis not present

## 2022-10-18 DIAGNOSIS — H401131 Primary open-angle glaucoma, bilateral, mild stage: Secondary | ICD-10-CM | POA: Diagnosis not present

## 2022-11-06 NOTE — Progress Notes (Unsigned)
Cardiology Office Note:    Date:  11/07/2022   ID:  Andrew Vaughn, DOB 04-19-1956, MRN [id number]  PCP:  Nelwyn Salisbury, MD  Cardiologist:  Little Ishikawa, MD  Electrophysiologist:  None   Referring MD: Nelwyn Salisbury, MD    Chief complaint: heart failure  History of Present Illness:    Andrew Vaughn is a 67 y.o. male with a hx of cardiac amyloidosis, chronic systolic heart failure, hypertension and glaucoma who presents for follow-up.  He was admitted to [facility] from 01/20/2022 to 01/24/2019.  He had presented with shortness of breath for the few months prior to admission.  In addition, he has been having intermittent fevers throughout the month of July, for which he completed a 9-day course of doxycycline.  Fevers resolved but he continued to have shortness of breath prompting his ED visit.  TTE on 01/22/2020 showed EF 40 to 45%, mild LVH, grade 3 diastolic dysfunction.  He was also found to have a PE.  Coronary CT showed nonobstructive CAD.  He was started on Entresto and carvedilol.  Repeat TTE on 05/01/2019 showed normalization of LV function, but severe LVH and grade 3 diastolic dysfunction.  PYP scan consistent with cardiac amyloidosis.  Echocardiogram on 05/12/2021 showed EF 45 to 50%, severe LVH, normal RV function, severe left atrial dilatation, moderate right atrial dilatation, no significant valvular disease.  Echocardiogram 05/25/2022 showed EF 55 to 60%, normal RV function, severe biatrial enlargement, mild mitral regurgitation.  Since last clinic visit, he reports he is doing well.  Denies any chest pain, dyspnea, lightheadedness, syncope, lower extremity edema, or palpitations.  He is walking 3 times per week for 30 minutes.  He has been off Vyndamax x 1 month, reports he switched to Medicare and is too expensive.    Past Medical History:  Diagnosis Date   Glaucoma    per Dr. Charlotte Sanes   Hypertension     Past Surgical History:  Procedure Laterality Date   dislocated  left shoulder  1993   right knee giant cell bone tumor  1998   per Dr. Elesa Massed at [facility]   ruptured lumbar disc  1976   torn meniscus left knee  8/08   Dr. Madelon Lips    Current Medications: Current Meds  Medication Sig   apixaban (ELIQUIS) 5 MG TABS tablet Take 1 tablet (5 mg total) by mouth 2 (two) times daily.   Ascorbic Acid (VITAMIN C) 1000 MG tablet Take 1,000 mg by mouth daily.   atorvastatin (LIPITOR) 40 MG tablet TAKE 1 TABLET(40 MG) BY MOUTH DAILY AT 6 PM   carvedilol (COREG) 12.5 MG tablet Take 1 tablet (12.5 mg total) by mouth 2 (two) times daily with a meal.   ENTRESTO 49-51 MG TAKE 1 TABLET BY MOUTH TWICE DAILY   furosemide (LASIX) 40 MG tablet TAKE 1 TABLET BY MOUTH AS NEEDED( WEIGHT INCREASE OF 3LBS OVERNIGHT OR 5LBS IN 1 WEEK)   Multiple Vitamin (MULTIVITAMIN) capsule Take 1 capsule by mouth daily.   [DISCONTINUED] aspirin EC 81 MG tablet Take 1 tablet (81 mg total) by mouth daily. Swallow whole.   [DISCONTINUED] dapagliflozin propanediol (FARXIGA) 10 MG TABS tablet Take 1 tablet (10 mg total) by mouth daily before breakfast.     Allergies:   Shrimp [shellfish allergy], Erythromycin, Ivp dye [iodinated contrast media], and Penicillins   Social History   Socioeconomic History   Marital status: Married    Spouse name: Not on file   Number of  children: 3   Years of education: Not on file   Highest education level: Not on file  Occupational History   Not on file  Tobacco Use   Smoking status: Never   Smokeless tobacco: Never  Substance and Sexual Activity   Alcohol use: Yes    Alcohol/week: 0.0 standard drinks of alcohol    Comment: occ   Drug use: No   Sexual activity: Not on file  Other Topics Concern   Not on file  Social History Narrative   Not on file   Social Determinants of Health   Financial Resource Strain: Not on file  Food Insecurity: Not on file  Transportation Needs: Not on file  Physical Activity: Not on file  Stress: Not on file   Social Connections: Not on file     Family History: The patient's family history includes Atrial fibrillation in his father; CAD in his father; Dementia in his mother; Glaucoma in an other family member; Heart disease in an other family member; Heart failure in his father; Hypertension in an other family member; Hypothyroidism in his sister; Stroke in an other family member.  ROS:   Please see the history of present illness.     All other systems reviewed and are negative.  EKGs/Labs/Other Studies Reviewed:    The following studies were reviewed today:   EKG:   11/02/2021: Sinus rhythm, first-degree AV block, rate 60, right axis deviation, Q waves in V1-3, I/aVL, T wave inversions in inferior leads 04/22/21: Sinus rhythm, first-degree AV block, rate 67, right axis deviation, Q waves in V1-4, I, aVL, T wave inversions in leads III, aVF  CTA chest 01/21/19: 1. Very tiny nonocclusive pulmonary embolus involving the right lower lobe as detailed above. 2. Overall findings most concerning for congestive heart failure including moderate-sized bilateral pleural effusions. 3. Mediastinal adenopathy, presumably reactive. 4. There is a 1 cm rounded nodular opacity involving the right lower lobe as detailed above. This may represent a focus of atelectasis, infiltrate, or pulmonary nodule. A three-month follow-up CT of the chest is recommended to confirm resolution of this finding. 5. Dilated ascending aorta measuring 4.2 cm. Recommend annual imaging followup by CTA or MRA. This recommendation follows 2010 ACCF/AHA/AATS/ACR/ASA/SCA/SCAI/SIR/STS/SVM Guidelines for the Diagnosis and Management of Patients with Thoracic Aortic Disease. Circulation. 2010; 121: Z610-R604. Aortic aneurysm NOS (ICD10-I71.9)  TTE 01/22/19:  1. The left ventricle has mild-moderately reduced systolic function, with an ejection fraction of 40-45%. The cavity size was normal. There is mild concentric left ventricular  hypertrophy. Left ventricular diastolic Doppler parameters are consistent  with restrictive filling. Left ventrical global hypokinesis without regional wall motion abnormalities.  2. The right ventricle has mildly reduced systolic function. The cavity was mildly enlarged. There is no increase in right ventricular wall thickness. Right ventricular systolic pressure is mildly elevated with an estimated pressure of 41.0 mmHg.  3. Left atrial size was mildly dilated.  4. Right atrial size was mildly dilated.  5. Mitral valve regurgitation is mild to moderate by color flow Doppler. The MR jet is centrally-directed.  6. The aorta is normal unless otherwise noted.  7. The inferior vena cava was dilated in size with <50% respiratory variability.  Coronary CTA 01/23/19: 1. Coronary calcium score of 0. This was 0 percentile for age and sex matched control. 2. Normal coronary origin with right dominance. 3. Nonobstructive CAD. There is noncalcified plaque in the proximal RCA causing ~50% stenosis. CTFFR across this lesion is 0.88, suggesting lesion is not functionally  significant. CAD-RADS 3. Moderate stenosis. Consider symptom-guided anti-ischemic pharmacotherapy as well as risk factor modification per guideline directed care. Additional analysis with CT FFR will be submitted.   TTE 05/01/19:  1. In the setting of severe concentric left ventricular hypertrophy, pattern of restrictive diastolic filling, and increased RV wall thickeness, cannot exclude a diagnosis of cardiac amyloidosis or other infiltrative process.  2. Left ventricular ejection fraction, by 3D calculation, is 57%. The left ventricle has normal function. There is severely increased left ventricular hypertrophy.  3. Left ventricular diastolic parameters are consistent with Grade III diastolic dysfunction (restrictive).  4. Global right ventricle has normal systolic function.The right ventricular size is normal. Moderately increased right  ventricular wall thickness.  5. Left atrial size was moderately dilated.  6. Right atrial size was mildly dilated.  7. The mitral valve is grossly normal. No evidence of mitral valve regurgitation. Mild mitral stenosis.  8. The tricuspid valve is normal in structure. Tricuspid valve regurgitation is trivial.  9. The aortic valve is tricuspid. Aortic valve regurgitation is trivial. No evidence of aortic valve sclerosis or stenosis. 10. The pulmonic valve was normal in structure. Pulmonic valve regurgitation is trivial. 11. Normal pulmonary artery systolic pressure. 12. The inferior vena cava is normal in size with <50% respiratory variability, suggesting right atrial pressure of 8 mmHg. 13. The left ventricle has no regional wall motion abnormalities.  Recent Labs: 12/06/2021: Hemoglobin 13.7; Platelets 352 04/26/2022: BUN 17; Creatinine, Ser 1.09; Magnesium 2.2; Potassium 4.4; Sodium 143  Recent Lipid Panel    Component Value Date/Time   CHOL 174 04/26/2022 1636   TRIG 113 04/26/2022 1636   HDL 45 04/26/2022 1636   CHOLHDL 3.9 04/26/2022 1636   CHOLHDL 4.5 01/22/2019 0307   VLDL 9 01/22/2019 0307   LDLCALC 109 (H) 04/26/2022 1636    Physical Exam:    VS:  BP 118/78 (BP Location: Left Arm, Patient Position: Sitting, Cuff Size: Large)   Pulse 65   Ht 6' (1.829 m)   Wt 229 lb (103.9 kg)   SpO2 96%   BMI 31.06 kg/m     Wt Readings from Last 3 Encounters:  11/07/22 229 lb (103.9 kg)  04/26/22 226 lb (102.5 kg)  12/16/21 213 lb 9.6 oz (96.9 kg)     GEN: Well nourished, well developed in no acute distress HEENT: Normal NECK: No JVD CARDIAC: RRR, no murmurs, rubs, gallops RESPIRATORY:  Clear to auscultation without rales, wheezing or rhonchi  ABDOMEN: Soft, non-tender, non-distended MUSCULOSKELETAL:  Trace LE edema SKIN: Warm and dry NEUROLOGIC:  Alert and oriented x 3 PSYCHIATRIC:  Normal affect   ASSESSMENT:    1. Atrial flutter, unspecified type (HCC)   2. Cardiac  amyloidosis (HCC)   3. Chronic combined systolic and diastolic heart failure (HCC)   4. Essential hypertension   5. Coronary artery disease involving native coronary artery of native heart without angina pectoris   6. Pre-procedure lab exam      PLAN:     Atrial flutter: New diagnosis, noted to be in atrial flutter in clinic today.  Rates appear well-controlled.  CHA2DS2-VASc 4 (CHF, hypertension, age, PE) -Start Eliquis 5 mg twice daily -Rates well-controlled on carvedilol, would continue -Plan follow-up in 3 weeks.  If remains in atrial flutter, will plan for cardioversion once completed 3 weeks of anticoagulation -Check BMET, CBC  Cardiac amyloidosis: severe concentric hypertrophy and grade 3 diastolic dysfunction noted on echo. Unable to get MRI due to claustrophobia.  PYP scan on  03/10/2020 strongly suggestive of amyloidosis.  SPEP/UPEP unremarkable but abnormal light chains (kappa/lambda ratio 1.8).  Referred to hematology, work-up unremarkable, not suggestive of AL amyloidosis.  Findings consistent with ATTR amyloidosis.  Genetic testing was negative. -Has been on tafamidis 61 mg daily but has been off x 1 month as he switched to Medicare and is too expensive.  Will discuss with pharmacy  Chronic combined systolic and diastolic heart failure: Echo 01/23/19 with EF 40-45%, G3DD, diffuse LV hypokinesis without RWMA, mild concentric LV hypertrophy, mildly reduced RV systolic dysfunction, and mild-moderate MR.  Repeat Echo 05/01/19 showed normalization of LV systolic function, but severe concentric LVH.  Coronary CTA with nonobstructive CAD.    PYP scan consistent with amyloidosis as above.  Echocardiogram on 05/12/2021 showed EF 45 to 50%, severe LVH, normal RV function, severe left atrial dilatation, moderate right atrial dilatation, no significant valvular disease.  Echocardiogram 05/25/2022 showed EF 55 to 60%, severe LVH, normal RV function, severe biatrial enlargement, mild mitral  regurgitation. - Appears euvolemic.  Continue Lasix 40 mg daily as needed if gains more than 3 pounds in 1 day or 5 pounds in 1 week - Continue Farxiga 10 mg daily - Continue spironolactone 12.5 mg - Continue Entresto 49-51 mg.   - Continue carvedilol 12.5 mg BID - Continue tafamidis for cardiac amyloidosis as above   Nonobstructive CAD: CT 01/2019 showed calcium score 0, but noncalcified plaque causing ~50% RCA stenosis (CTFFR 0.88, suggesting lesion is not functionally significant).  Denies any anginal symptoms - Atorvastatin 40 mg daily - Stop aspirin given starting Eliquis as above   Hypertension: Appears controlled -Continue Entresto and carvedilol   HLD: LDL 52 on 04/22/2021, on atorvastatin 40 mg daily.  LDL 04/26/2022 was 109, patient reported he had not been taking atorvastatin consistently.  Restarted atorvastatin 40 mg daily   Thoracic aortic dilatation noted to have a 4.2cm dilation of the ascending aorta.  Repeat CTA chest on 03/09/2020 showed 3.9 cm ascending aorta.  Measured 3.7 cm on echocardiogram 05/2022  Pulmonary embolism/left lower extremity DVT: Diagnosed during hospitalization on 01/21/2019.  Started on Eliquis 5 mg twice daily.  PE thought to be provoked, due to sedentary lifestyle (working long hours as Airline pilot, sitting at desk all day).  Completed 38-month course of anticoagulation, have discontinued -Restarting Eliquis given new atrial flutter as above  Pulmonary nodule: 1 cm rounded nodular opacity involving the right lower lobe noted on CT chest 01/21/2019, which could represent atelectasis, infiltrate, or pulmonary nodule.  Follow-up CT on 05/14/2019 showed resolution of nodule  RTC in 3 weeks   Medication Adjustments/Labs and Tests Ordered: Current medicines are reviewed at length with the patient today.  Concerns regarding medicines are outlined above.  Orders Placed This Encounter  Procedures   Comprehensive Metabolic Panel (CMET)   Magnesium   CBC  w/Diff/Platelet   TSH   Lipid panel   EKG 12-Lead     Meds ordered this encounter  Medications   apixaban (ELIQUIS) 5 MG TABS tablet    Sig: Take 1 tablet (5 mg total) by mouth 2 (two) times daily.    Dispense:  180 tablet    Refill:  3   dapagliflozin propanediol (FARXIGA) 10 MG TABS tablet    Sig: Take 1 tablet (10 mg total) by mouth daily before breakfast.    Dispense:  90 tablet    Refill:  3     Patient Instructions  Medication Instructions:  Start Eliquis 5 mg twice a day Stop  Aspirin Continue all other medications *If you need a refill on your cardiac medications before your next appointment, please call your pharmacy*   Lab Work: Cmet,magnesium,cbc,tsh,lipid panel today   Testing/Procedures: Cardioversion scheduled Tuesday 7/16 arrive at [facility] hospital 6:30 am   Follow instructions below   Follow-Up: At [facility], you and your health needs are our priority.  As part of our continuing mission to provide you with exceptional heart care, we have created designated Provider Care Teams.  These Care Teams include your primary Cardiologist (physician) and Advanced Practice Providers (APPs -  Physician Assistants and Nurse Practitioners) who all work together to provide you with the care you need, when you need it.  We recommend signing up for the patient portal called "MyChart".  Sign up information is provided on this After Visit Summary.  MyChart is used to connect with patients for Virtual Visits (Telemedicine).  Patients are able to view lab/test results, encounter notes, upcoming appointments, etc.  Non-urgent messages can be sent to your provider as well.   To learn more about what you can do with MyChart, go to [email or web address].    Your next appointment:  Monday 7/15 at 10:20 am    Provider:  Dr.Kaniesha Barile     Dear Andrew Vaughn  You are scheduled for a Cardioversion on Tuesday, July 16 with Dr. Royann Shivers.  Please arrive at the [facility]  (Main Entrance A) at [facility]: [address] [place], [place] [address] at 6:30 AM (This time is 1 hour(s) before your procedure to ensure your preparation). Free valet parking service is available. You will check in at ADMITTING. The support person will be asked to wait in the waiting room.  It is OK to have someone drop you off and come back when you are ready to be discharged.     DIET:  Nothing to eat or drink after midnight except a sip of water with medications (see medication instructions below)   HOLD: Dapagliflozin (Farxiga)  72 hours prior to the procedure. Last dose on Saturday, July 06.    Labs to be done 6/17 at Dr.Lesslie Mckeehan's office [facility]  FYI:  For your safety, and to allow [place] to monitor your vital signs accurately during the surgery/procedure we request: If you have artificial nails, gel coating, SNS etc, please have those removed prior to your surgery/procedure. Not having the nail coverings /polish removed may result in cancellation or delay of your surgery/procedure.  You must have a responsible person to drive you home and stay in the waiting area during your procedure. Failure to do so could result in cancellation.  Bring your insurance cards.  *Special Note: Every effort is made to have your procedure done on time. Occasionally there are emergencies that occur at the hospital that may cause delays. Please be patient if a delay does occur.            Signed, Little Ishikawa, MD  11/07/2022 1:28 PM    [facility]

## 2022-11-07 ENCOUNTER — Encounter: Payer: Self-pay | Admitting: Cardiology

## 2022-11-07 ENCOUNTER — Ambulatory Visit: Payer: PPO | Attending: Cardiology | Admitting: Cardiology

## 2022-11-07 VITALS — BP 118/78 | HR 65 | Ht 72.0 in | Wt 229.0 lb

## 2022-11-07 DIAGNOSIS — E854 Organ-limited amyloidosis: Secondary | ICD-10-CM

## 2022-11-07 DIAGNOSIS — I10 Essential (primary) hypertension: Secondary | ICD-10-CM | POA: Diagnosis not present

## 2022-11-07 DIAGNOSIS — I43 Cardiomyopathy in diseases classified elsewhere: Secondary | ICD-10-CM | POA: Diagnosis not present

## 2022-11-07 DIAGNOSIS — I4892 Unspecified atrial flutter: Secondary | ICD-10-CM | POA: Diagnosis not present

## 2022-11-07 DIAGNOSIS — I2510 Atherosclerotic heart disease of native coronary artery without angina pectoris: Secondary | ICD-10-CM | POA: Diagnosis not present

## 2022-11-07 DIAGNOSIS — I5042 Chronic combined systolic (congestive) and diastolic (congestive) heart failure: Secondary | ICD-10-CM

## 2022-11-07 DIAGNOSIS — Z01812 Encounter for preprocedural laboratory examination: Secondary | ICD-10-CM | POA: Diagnosis not present

## 2022-11-07 LAB — COMPREHENSIVE METABOLIC PANEL
ALT: 22 IU/L (ref 0–44)
Alkaline Phosphatase: 135 IU/L — ABNORMAL HIGH (ref 44–121)
CO2: 24 mmol/L (ref 20–29)
Globulin, Total: 2.5 g/dL (ref 1.5–4.5)
Glucose: 90 mg/dL (ref 70–99)
Sodium: 140 mmol/L (ref 134–144)
eGFR: 59 mL/min/{1.73_m2} — ABNORMAL LOW (ref >59)

## 2022-11-07 LAB — LIPID PANEL: Chol/HDL Ratio: 2.8 ratio (ref 0.0–5.0)

## 2022-11-07 LAB — MAGNESIUM: Magnesium: 2 mg/dL (ref 1.6–2.3)

## 2022-11-07 LAB — CBC WITH DIFFERENTIAL/PLATELET

## 2022-11-07 MED ORDER — APIXABAN 5 MG PO TABS: 5.0000 mg | ORAL_TABLET | Freq: Two times a day (BID) | ORAL | 3 refills | Status: DC

## 2022-11-07 MED ORDER — DAPAGLIFLOZIN PROPANEDIOL 10 MG PO TABS: 10.0000 mg | ORAL_TABLET | Freq: Every day | ORAL | 3 refills | Status: DC

## 2022-11-07 NOTE — Patient Instructions (Signed)
Medication Instructions:  Start Eliquis 5 mg twice a day Stop Aspirin Continue all other medications *If you need a refill on your cardiac medications before your next appointment, please call your pharmacy*   Lab Work: Cmet,magnesium,cbc,tsh,lipid panel today   Testing/Procedures: Cardioversion scheduled Tuesday 7/16 arrive at [facility] hospital 6:30 am   Follow instructions below   Follow-Up: At [facility], you and your health needs are our priority.  As part of our continuing mission to provide you with exceptional heart care, we have created designated Provider Care Teams.  These Care Teams include your primary Cardiologist (physician) and Advanced Practice Providers (APPs -  Physician Assistants and Nurse Practitioners) who all work together to provide you with the care you need, when you need it.  We recommend signing up for the patient portal called "MyChart".  Sign up information is provided on this After Visit Summary.  MyChart is used to connect with patients for Virtual Visits (Telemedicine).  Patients are able to view lab/test results, encounter notes, upcoming appointments, etc.  Non-urgent messages can be sent to your provider as well.   To learn more about what you can do with MyChart, go to [email or web address].    Your next appointment:  Monday 7/15 at 10:20 am    Provider:  Dr.Schumann     Dear Andrew Vaughn  You are scheduled for a Cardioversion on Tuesday, July 16 with Dr. Royann Shivers.  Please arrive at the [facility] (Main Entrance A) at [facility]: [address] [place], [place] [address] at 6:30 AM (This time is 1 hour(s) before your procedure to ensure your preparation). Free valet parking service is available. You will check in at ADMITTING. The support person will be asked to wait in the waiting room.  It is OK to have someone drop you off and come back when you are ready to be discharged.     DIET:  Nothing to eat or drink after midnight  except a sip of water with medications (see medication instructions below)   HOLD: Dapagliflozin (Farxiga)  72 hours prior to the procedure. Last dose on Saturday, July 06.    Labs to be done 6/17 at Dr.Schumann's office [facility]  FYI:  For your safety, and to allow [place] to monitor your vital signs accurately during the surgery/procedure we request: If you have artificial nails, gel coating, SNS etc, please have those removed prior to your surgery/procedure. Not having the nail coverings /polish removed may result in cancellation or delay of your surgery/procedure.  You must have a responsible person to drive you home and stay in the waiting area during your procedure. Failure to do so could result in cancellation.  Bring your insurance cards.  *Special Note: Every effort is made to have your procedure done on time. Occasionally there are emergencies that occur at the hospital that may cause delays. Please be patient if a delay does occur.

## 2022-11-08 ENCOUNTER — Other Ambulatory Visit: Payer: Self-pay | Admitting: *Deleted

## 2022-11-08 ENCOUNTER — Other Ambulatory Visit: Payer: Self-pay

## 2022-11-08 ENCOUNTER — Other Ambulatory Visit (HOSPITAL_COMMUNITY): Payer: Self-pay

## 2022-11-08 ENCOUNTER — Telehealth: Payer: Self-pay | Admitting: Pharmacist Clinician (PhC)/ Clinical Pharmacy Specialist

## 2022-11-08 DIAGNOSIS — I5042 Chronic combined systolic (congestive) and diastolic (congestive) heart failure: Secondary | ICD-10-CM

## 2022-11-08 DIAGNOSIS — I4892 Unspecified atrial flutter: Secondary | ICD-10-CM

## 2022-11-08 DIAGNOSIS — I2510 Atherosclerotic heart disease of native coronary artery without angina pectoris: Secondary | ICD-10-CM

## 2022-11-08 DIAGNOSIS — I10 Essential (primary) hypertension: Secondary | ICD-10-CM

## 2022-11-08 LAB — CBC WITH DIFFERENTIAL/PLATELET
Basophils Absolute: 0 10*3/uL (ref 0.0–0.2)
Basos: 0 %
EOS (ABSOLUTE): 0.1 10*3/uL (ref 0.0–0.4)
Eos: 1 %
Hemoglobin: 14.9 g/dL (ref 13.0–17.7)
Immature Grans (Abs): 0 10*3/uL (ref 0.0–0.1)
Immature Granulocytes: 0 %
Lymphocytes Absolute: 1.3 10*3/uL (ref 0.7–3.1)
Lymphs: 16 %
MCHC: 32.7 g/dL (ref 31.5–35.7)
MCV: 88 fL (ref 79–97)
Monocytes Absolute: 0.5 10*3/uL (ref 0.1–0.9)
Monocytes: 6 %
Neutrophils Absolute: 6.4 10*3/uL (ref 1.4–7.0)
Neutrophils: 77 %
Platelets: 155 10*3/uL (ref 150–450)
RBC: 5.17 x10E6/uL (ref 4.14–5.80)
RDW: 12.5 % (ref 11.6–15.4)

## 2022-11-08 LAB — COMPREHENSIVE METABOLIC PANEL
AST: 26 IU/L (ref 0–40)
Albumin: 4.8 g/dL (ref 3.9–4.9)
BUN/Creatinine Ratio: 13 (ref 10–24)
BUN: 18 mg/dL (ref 8–27)
Bilirubin Total: 1.5 mg/dL — ABNORMAL HIGH (ref 0.0–1.2)
Calcium: 9.7 mg/dL (ref 8.6–10.2)
Chloride: 102 mmol/L (ref 96–106)
Creatinine, Ser: 1.34 mg/dL — ABNORMAL HIGH (ref 0.76–1.27)
Potassium: 4.8 mmol/L (ref 3.5–5.2)
Total Protein: 7.3 g/dL (ref 6.0–8.5)

## 2022-11-08 LAB — LIPID PANEL
Cholesterol, Total: 94 mg/dL — ABNORMAL LOW (ref 100–199)
HDL: 33 mg/dL — ABNORMAL LOW (ref >39)
LDL Chol Calc (NIH): 46 mg/dL (ref 0–99)
Triglycerides: 69 mg/dL (ref 0–149)
VLDL Cholesterol Cal: 15 mg/dL (ref 5–40)

## 2022-11-08 LAB — TSH: TSH: 0.926 u[IU]/mL (ref 0.450–4.500)

## 2022-11-08 MED ORDER — VYNDAMAX 61 MG PO CAPS
61.0000 mg | ORAL_CAPSULE | Freq: Every day | ORAL | 12 refills | Status: DC
  Filled 2022-11-08 – 2022-11-11 (×2): qty 30, 30d supply, fill #0
  Filled 2022-12-01: qty 30, 30d supply, fill #1
  Filled 2023-01-06: qty 30, 30d supply, fill #2
  Filled 2023-02-09: qty 30, 30d supply, fill #3
  Filled 2023-03-20: qty 30, 30d supply, fill #4
  Filled 2023-04-25: qty 30, 30d supply, fill #5
  Filled 2023-05-19: qty 30, 30d supply, fill #6
  Filled 2023-06-27: qty 30, 30d supply, fill #7
  Filled 2023-08-01: qty 30, 30d supply, fill #8
  Filled 2023-08-24: qty 30, 30d supply, fill #9
  Filled 2023-10-02: qty 30, 30d supply, fill #10
  Filled 2023-10-31: qty 30, 30d supply, fill #11

## 2022-11-08 NOTE — Telephone Encounter (Signed)
-----   Message from Little Ishikawa, MD sent at 11/07/2022  1:24 PM EDT ----- Regarding: Kathrine Cords, Just wanted to touch base on Mr Fleece- he recently turned 47 and started on Medicare, and reports since switching to Medicare his tafamadis is too expensive, stopped taking about 1 month ago.  Is there anything we can do to get the medication for cheaper? Thanks, Thayer Ohm

## 2022-11-08 NOTE — Telephone Encounter (Signed)
-----   Message from Christopher L Schumann, MD sent at 11/07/2022  1:24 PM EDT ----- Regarding: Tafamadis Hi Andrew Vaughn, Just wanted to touch base on Andrew Vaughn- he recently turned 66 and started on Medicare, and reports since switching to Medicare his tafamadis is too expensive, stopped taking about 1 month ago.  Is there anything we can do to get the medication for cheaper? Thanks, Chris   

## 2022-11-09 ENCOUNTER — Other Ambulatory Visit (HOSPITAL_COMMUNITY): Payer: Self-pay

## 2022-11-11 ENCOUNTER — Other Ambulatory Visit: Payer: Self-pay

## 2022-11-11 ENCOUNTER — Other Ambulatory Visit (HOSPITAL_COMMUNITY): Payer: Self-pay

## 2022-11-15 DIAGNOSIS — I5042 Chronic combined systolic (congestive) and diastolic (congestive) heart failure: Secondary | ICD-10-CM | POA: Diagnosis not present

## 2022-11-15 DIAGNOSIS — I2510 Atherosclerotic heart disease of native coronary artery without angina pectoris: Secondary | ICD-10-CM | POA: Diagnosis not present

## 2022-11-15 DIAGNOSIS — I10 Essential (primary) hypertension: Secondary | ICD-10-CM | POA: Diagnosis not present

## 2022-11-16 LAB — COMPREHENSIVE METABOLIC PANEL
ALT: 18 IU/L (ref 0–44)
AST: 26 IU/L (ref 0–40)
Albumin: 4.4 g/dL (ref 3.9–4.9)
Alkaline Phosphatase: 122 IU/L — ABNORMAL HIGH (ref 44–121)
BUN/Creatinine Ratio: 17 (ref 10–24)
BUN: 21 mg/dL (ref 8–27)
Bilirubin Total: 1 mg/dL (ref 0.0–1.2)
CO2: 25 mmol/L (ref 20–29)
Calcium: 9.6 mg/dL (ref 8.6–10.2)
Chloride: 103 mmol/L (ref 96–106)
Creatinine, Ser: 1.21 mg/dL (ref 0.76–1.27)
Globulin, Total: 2.4 g/dL (ref 1.5–4.5)
Glucose: 110 mg/dL — ABNORMAL HIGH (ref 70–99)
Potassium: 4.6 mmol/L (ref 3.5–5.2)
Sodium: 140 mmol/L (ref 134–144)
Total Protein: 6.8 g/dL (ref 6.0–8.5)
eGFR: 66 mL/min/{1.73_m2} (ref >59)

## 2022-11-25 ENCOUNTER — Other Ambulatory Visit: Payer: Self-pay | Admitting: Cardiology

## 2022-11-27 NOTE — Pre-Procedure Instructions (Signed)
Patient is scheduled for procedure on Tuesday 7/16 with anesthesia.  Anesthesia requires certain medications to be held before procedure.  Andrew Vaughn- needs to be held 3 days before procedure.  Last dose should be 7/12.  Don't take any on 7/13, 7/14 or 7/15.  Don't take on day of procedure 7/16.  Attempted to call patient.  No answer.

## 2022-12-01 ENCOUNTER — Other Ambulatory Visit (HOSPITAL_COMMUNITY): Payer: Self-pay

## 2022-12-04 NOTE — Progress Notes (Signed)
Cardiology Office Note:    Date:  12/05/2022   ID:  Andrew Vaughn, DOB Jun 27, 1955, MRN [id number]  PCP:  Andrew Salisbury, MD  Cardiologist:  Little Ishikawa, MD  Electrophysiologist:  None   Referring MD: Andrew Salisbury, MD    Chief complaint: heart failure  History of Present Illness:    Andrew Vaughn is a 67 y.o. male with a hx of cardiac amyloidosis, chronic systolic heart failure, hypertension and glaucoma who presents for follow-up.  He was admitted to [facility] from 01/20/2022 to 01/24/2019.  He had presented with shortness of breath for the few months prior to admission.  In addition, he has been having intermittent fevers throughout the month of July, for which he completed a 9-day course of doxycycline.  Fevers resolved but he continued to have shortness of breath prompting his ED visit.  TTE on 01/22/2020 showed EF 40 to 45%, mild LVH, grade 3 diastolic dysfunction.  He was also found to have a PE.  Coronary CT showed nonobstructive CAD.  He was started on Entresto and carvedilol.  Repeat TTE on 05/01/2019 showed normalization of LV function, but severe LVH and grade 3 diastolic dysfunction.  PYP scan consistent with cardiac amyloidosis.  Echocardiogram on 05/12/2021 showed EF 45 to 50%, severe LVH, normal RV function, severe left atrial dilatation, moderate right atrial dilatation, no significant valvular disease.  Echocardiogram 05/25/2022 showed EF 55 to 60%, normal RV function, severe biatrial enlargement, mild mitral regurgitation.  Since last clinic visit, he reports he is doing okay.  States that he feels more fatigued recently.  Reports dyspnea with walking up hills, has been walking 30 to 60 minutes/day.  Denies any chest pain, lightheadedness, syncope, lower extremity edema, or palpitations.  Reports compliance with Eliquis, states that has not missed any doses over the last 3 weeks.  Denies any bleeding on Eliquis.    Wt Readings from Last 3 Encounters:  12/05/22 233 lb 6.4  oz (105.9 kg)  11/07/22 229 lb (103.9 kg)  04/26/22 226 lb (102.5 kg)     Past Medical History:  Diagnosis Date   Glaucoma    per Dr. Charlotte Sanes   Hypertension     Past Surgical History:  Procedure Laterality Date   dislocated left shoulder  1993   right knee giant cell bone tumor  1998   [facility] Dr. Elesa Massed at [facility]   ruptured lumbar disc  1976   torn meniscus left knee  8/08   Dr. Madelon Lips    Current Medications: Current Meds  Medication Sig   apixaban (ELIQUIS) 5 MG TABS tablet Take 1 tablet (5 mg total) by mouth 2 (two) times daily.   atorvastatin (LIPITOR) 40 MG tablet TAKE 1 TABLET(40 MG) BY MOUTH DAILY AT 6 PM   brimonidine (ALPHAGAN P) 0.1 % SOLN Place 1 drop into both eyes at bedtime.   carvedilol (COREG) 12.5 MG tablet Take 1 tablet (12.5 mg total) by mouth 2 (two) times daily with a meal.   dapagliflozin propanediol (FARXIGA) 10 MG TABS tablet Take 1 tablet (10 mg total) by mouth daily before breakfast.   ENTRESTO 49-51 MG TAKE 1 TABLET BY MOUTH TWICE DAILY   furosemide (LASIX) 40 MG tablet TAKE 1 TABLET BY MOUTH AS NEEDED( WEIGHT INCREASE OF 3LBS OVERNIGHT OR 5LBS IN 1 WEEK) (Patient taking differently: Take 40 mg by mouth in the morning.)   spironolactone (ALDACTONE) 25 MG tablet TAKE 1/2 TABLET(12.5 MG) BY MOUTH DAILY   Tafamidis (VYNDAMAX)  61 MG CAPS Take 1 capsule (61 mg total) by mouth daily.     Allergies:   Shrimp [shellfish allergy], Erythromycin, Ivp dye [iodinated contrast media], and Penicillins   Social History   Socioeconomic History   Marital status: Married    Spouse name: Not on file   Number of children: 3   Years of education: Not on file   Highest education level: Not on file  Occupational History   Not on file  Tobacco Use   Smoking status: Never   Smokeless tobacco: Never  Substance and Sexual Activity   Alcohol use: Yes    Alcohol/week: 0.0 standard drinks of alcohol    Comment: occ   Drug use: No   Sexual activity: Not on file   Other Topics Concern   Not on file  Social History Narrative   Not on file   Social Determinants of Health   Financial Resource Strain: Not on file  Food Insecurity: Not on file  Transportation Needs: Not on file  Physical Activity: Not on file  Stress: Not on file  Social Connections: Not on file     Family History: The patient's family history includes Atrial fibrillation in his father; CAD in his father; Dementia in his mother; Glaucoma in an other family member; Heart disease in an other family member; Heart failure in his father; Hypertension in an other family member; Hypothyroidism in his sister; Stroke in an other family member.  ROS:   Please see the history of present illness.     All other systems reviewed and are negative.  EKGs/Labs/Other Studies Reviewed:    The following studies were reviewed today:   EKG:   12/05/2022: Atrial flutter with variable conduction, rate 60 11/02/2021: Sinus rhythm, first-degree AV block, rate 60, right axis deviation, Q waves in V1-3, I/aVL, T wave inversions in inferior leads 04/22/21: Sinus rhythm, first-degree AV block, rate 67, right axis deviation, Q waves in V1-4, I, aVL, T wave inversions in leads III, aVF  CTA chest 01/21/19: 1. Very tiny nonocclusive pulmonary embolus involving the right lower lobe as detailed above. 2. Overall findings most concerning for congestive heart failure including moderate-sized bilateral pleural effusions. 3. Mediastinal adenopathy, presumably reactive. 4. There is a 1 cm rounded nodular opacity involving the right lower lobe as detailed above. This may represent a focus of atelectasis, infiltrate, or pulmonary nodule. A three-month follow-up CT of the chest is recommended to confirm resolution of this finding. 5. Dilated ascending aorta measuring 4.2 cm. Recommend annual imaging followup by CTA or MRA. This recommendation follows 2010 ACCF/AHA/AATS/ACR/ASA/SCA/SCAI/SIR/STS/SVM Guidelines for  the Diagnosis and Management of Patients with Thoracic Aortic Disease. Circulation. 2010; 121: W102-V253. Aortic aneurysm NOS (ICD10-I71.9)  TTE 01/22/19:  1. The left ventricle has mild-moderately reduced systolic function, with an ejection fraction of 40-45%. The cavity size was normal. There is mild concentric left ventricular hypertrophy. Left ventricular diastolic Doppler parameters are consistent  with restrictive filling. Left ventrical global hypokinesis without regional wall motion abnormalities.  2. The right ventricle has mildly reduced systolic function. The cavity was mildly enlarged. There is no increase in right ventricular wall thickness. Right ventricular systolic pressure is mildly elevated with an estimated pressure of 41.0 mmHg.  3. Left atrial size was mildly dilated.  4. Right atrial size was mildly dilated.  5. Mitral valve regurgitation is mild to moderate by color flow Doppler. The MR jet is centrally-directed.  6. The aorta is normal unless otherwise noted.  7. The  inferior vena cava was dilated in size with <50% respiratory variability.  Coronary CTA 01/23/19: 1. Coronary calcium score of 0. This was 0 percentile for age and sex matched control. 2. Normal coronary origin with right dominance. 3. Nonobstructive CAD. There is noncalcified plaque in the proximal RCA causing ~50% stenosis. CTFFR across this lesion is 0.88, suggesting lesion is not functionally significant. CAD-RADS 3. Moderate stenosis. Consider symptom-guided anti-ischemic pharmacotherapy as well as risk factor modification per guideline directed care. Additional analysis with CT FFR will be submitted.   TTE 05/01/19:  1. In the setting of severe concentric left ventricular hypertrophy, pattern of restrictive diastolic filling, and increased RV wall thickeness, cannot exclude a diagnosis of cardiac amyloidosis or other infiltrative process.  2. Left ventricular ejection fraction, by 3D calculation, is  57%. The left ventricle has normal function. There is severely increased left ventricular hypertrophy.  3. Left ventricular diastolic parameters are consistent with Grade III diastolic dysfunction (restrictive).  4. Global right ventricle has normal systolic function.The right ventricular size is normal. Moderately increased right ventricular wall thickness.  5. Left atrial size was moderately dilated.  6. Right atrial size was mildly dilated.  7. The mitral valve is grossly normal. No evidence of mitral valve regurgitation. Mild mitral stenosis.  8. The tricuspid valve is normal in structure. Tricuspid valve regurgitation is trivial.  9. The aortic valve is tricuspid. Aortic valve regurgitation is trivial. No evidence of aortic valve sclerosis or stenosis. 10. The pulmonic valve was normal in structure. Pulmonic valve regurgitation is trivial. 11. Normal pulmonary artery systolic pressure. 12. The inferior vena cava is normal in size with <50% respiratory variability, suggesting right atrial pressure of 8 mmHg. 13. The left ventricle has no regional wall motion abnormalities.  Recent Labs: 11/07/2022: Hemoglobin 14.9; Magnesium 2.0; Platelets 155; TSH 0.926 11/15/2022: ALT 18; BUN 21; Creatinine, Ser 1.21; Potassium 4.6; Sodium 140  Recent Lipid Panel    Component Value Date/Time   CHOL 94 (L) 11/07/2022 1146   TRIG 69 11/07/2022 1146   HDL 33 (L) 11/07/2022 1146   CHOLHDL 2.8 11/07/2022 1146   CHOLHDL 4.5 01/22/2019 0307   VLDL 9 01/22/2019 0307   LDLCALC 46 11/07/2022 1146    Physical Exam:    VS:  BP 124/68   Pulse 60   Ht 6' (1.829 m)   Wt 233 lb 6.4 oz (105.9 kg)   SpO2 94%   BMI 31.65 kg/m     Wt Readings from Last 3 Encounters:  12/05/22 233 lb 6.4 oz (105.9 kg)  11/07/22 229 lb (103.9 kg)  04/26/22 226 lb (102.5 kg)     GEN: Well nourished, well developed in no acute distress HEENT: Normal NECK: No JVD CARDIAC: RRR, no murmurs, rubs, gallops RESPIRATORY:  Clear  to auscultation without rales, wheezing or rhonchi  ABDOMEN: Soft, non-tender, non-distended MUSCULOSKELETAL:  Trace LE edema SKIN: Warm and dry NEUROLOGIC:  Alert and oriented x 3 PSYCHIATRIC:  Normal affect   ASSESSMENT:    1. Atrial flutter, unspecified type (HCC)   2. Pre-procedure lab exam   3. Cardiac amyloidosis (HCC)   4. Chronic combined systolic and diastolic heart failure (HCC)   5. Essential hypertension   6. Coronary artery disease involving native coronary artery of native heart without angina pectoris   7. Hyperlipidemia, unspecified hyperlipidemia type      PLAN:    Atrial fibrillation/flutter: New diagnosis, noted to be in atrial flutter in clinic on 11/07/2022.  Rates appear well-controlled.  CHA2DS2-VASc 4 (CHF, hypertension, age, PE).  On follow-up EKG today, remains in atrial flutter -Continue Eliquis 5 mg twice daily -Rates well-controlled on carvedilol, would continue -Cardioversion scheduled for 7/16 with Dr Royann Shivers.  Patient reports compliance with Eliquis, has not missed any doses over the last 3 weeks  Cardiac amyloidosis: severe concentric hypertrophy and grade 3 diastolic dysfunction noted on echo. Unable to get MRI due to claustrophobia.  PYP scan on 03/10/2020 strongly suggestive of amyloidosis.  SPEP/UPEP unremarkable but abnormal light chains (kappa/lambda ratio 1.8).  Referred to hematology, work-up unremarkable, not suggestive of AL amyloidosis.  Findings consistent with ATTR amyloidosis.  Genetic testing was negative. -Continue tafamidis 61 mg daily   Chronic combined systolic and diastolic heart failure: Echo 01/23/19 with EF 40-45%, G3DD, diffuse LV hypokinesis without RWMA, mild concentric LV hypertrophy, mildly reduced RV systolic dysfunction, and mild-moderate MR.  Repeat Echo 05/01/19 showed normalization of LV systolic function, but severe concentric LVH.  Coronary CTA with nonobstructive CAD.    PYP scan consistent with amyloidosis as above.   Echocardiogram on 05/12/2021 showed EF 45 to 50%, severe LVH, normal RV function, severe left atrial dilatation, moderate right atrial dilatation, no significant valvular disease.  Echocardiogram 05/25/2022 showed EF 55 to 60%, severe LVH, normal RV function, severe biatrial enlargement, mild mitral regurgitation. - Appears euvolemic.  Continue Lasix 40 mg daily as needed if gains more than 3 pounds in 1 day or 5 pounds in 1 week - Continue Farxiga 10 mg daily - Continue spironolactone 12.5 mg - Continue Entresto 49-51 mg.   - Continue carvedilol 12.5 mg BID - Continue tafamidis for cardiac amyloidosis as above -Check BMET   Nonobstructive CAD: CT 01/2019 showed calcium score 0, but noncalcified plaque causing ~50% RCA stenosis (CTFFR 0.88, suggesting lesion is not functionally significant).  Denies any anginal symptoms - Atorvastatin 40 mg daily - Stopped aspirin given started Eliquis as above   Hypertension: Appears controlled -Continue Entresto and carvedilol   HLD: LDL 52 on 04/22/2021, on atorvastatin 40 mg daily.  LDL 04/26/2022 was 109, patient reported he had not been taking atorvastatin consistently.  Restarted atorvastatin 40 mg daily, LDL 46 on 11/07/2022   Thoracic aortic dilatation noted to have a 4.2cm dilation of the ascending aorta.  Repeat CTA chest on 03/09/2020 showed 3.9 cm ascending aorta.  Measured 3.7 cm on echocardiogram 05/2022  Pulmonary embolism/left lower extremity DVT: Diagnosed during hospitalization on 01/21/2019.  Started on Eliquis 5 mg twice daily.  PE thought to be provoked, due to sedentary lifestyle (working long hours as Airline pilot, sitting at desk all day).  Completed 16-month course of anticoagulation and was discontinued -Restarted Eliquis given new atrial flutter as above  Pulmonary nodule: 1 cm rounded nodular opacity involving the right lower lobe noted on CT chest 01/21/2019, which could represent atelectasis, infiltrate, or pulmonary nodule.  Follow-up CT  on 05/14/2019 showed resolution of nodule  RTC in 1 month   Medication Adjustments/Labs and Tests Ordered: Current medicines are reviewed at length with the patient today.  Concerns regarding medicines are outlined above.  Orders Placed This Encounter  Procedures   Basic Metabolic Panel (BMET)   CBC with Differential/Platelet   EKG 12-Lead     No orders of the defined types were placed in this encounter.    Patient Instructions  Medication Instructions:  Your physician recommends that you continue on your current medications as directed. Please refer to the Current Medication list given to you today.  *If you need  a refill on your cardiac medications before your next appointment, please call your pharmacy*   Lab Work: Your physician recommends that you have labs drawn today: BMET, CBC If you have labs (blood work) drawn today and your tests are completely normal, you will receive your results only by: MyChart Message (if you have MyChart) OR A paper copy in the mail If you have any lab test that is abnormal or we need to change your treatment, we will call you to review the results.   Testing/Procedures: Please proceed with Cardioversion tomorrow, July 16th   Follow-Up: At [facility], you and your health needs are our priority.  As part of our continuing mission to provide you with exceptional heart care, we have created designated Provider Care Teams.  These Care Teams include your primary Cardiologist (physician) and Advanced Practice Providers (APPs -  Physician Assistants and Nurse Practitioners) who all work together to provide you with the care you need, when you need it.   Your next appointment:    August 21st at 2:40pm   Provider:   Little Ishikawa, MD    Signed, Little Ishikawa, MD  12/05/2022 10:48 AM    [facility]

## 2022-12-04 NOTE — H&P (View-Only) (Signed)
Cardiology Office Note:    Date:  12/05/2022   ID:  Andrew Vaughn, DOB Jun 27, 1955, MRN [id number]  PCP:  Nelwyn Salisbury, MD  Cardiologist:  Little Ishikawa, MD  Electrophysiologist:  None   Referring MD: Nelwyn Salisbury, MD    Chief complaint: heart failure  History of Present Illness:    Andrew Vaughn is a 67 y.o. male with a hx of cardiac amyloidosis, chronic systolic heart failure, hypertension and glaucoma who presents for follow-up.  He was admitted to [facility] from 01/20/2022 to 01/24/2019.  He had presented with shortness of breath for the few months prior to admission.  In addition, he has been having intermittent fevers throughout the month of July, for which he completed a 9-day course of doxycycline.  Fevers resolved but he continued to have shortness of breath prompting his ED visit.  TTE on 01/22/2020 showed EF 40 to 45%, mild LVH, grade 3 diastolic dysfunction.  He was also found to have a PE.  Coronary CT showed nonobstructive CAD.  He was started on Entresto and carvedilol.  Repeat TTE on 05/01/2019 showed normalization of LV function, but severe LVH and grade 3 diastolic dysfunction.  PYP scan consistent with cardiac amyloidosis.  Echocardiogram on 05/12/2021 showed EF 45 to 50%, severe LVH, normal RV function, severe left atrial dilatation, moderate right atrial dilatation, no significant valvular disease.  Echocardiogram 05/25/2022 showed EF 55 to 60%, normal RV function, severe biatrial enlargement, mild mitral regurgitation.  Since last clinic visit, he reports he is doing okay.  States that he feels more fatigued recently.  Reports dyspnea with walking up hills, has been walking 30 to 60 minutes/day.  Denies any chest pain, lightheadedness, syncope, lower extremity edema, or palpitations.  Reports compliance with Eliquis, states that has not missed any doses over the last 3 weeks.  Denies any bleeding on Eliquis.    Wt Readings from Last 3 Encounters:  12/05/22 233 lb 6.4  oz (105.9 kg)  11/07/22 229 lb (103.9 kg)  04/26/22 226 lb (102.5 kg)     Past Medical History:  Diagnosis Date   Glaucoma    per Dr. Charlotte Sanes   Hypertension     Past Surgical History:  Procedure Laterality Date   dislocated left shoulder  1993   right knee giant cell bone tumor  1998   [facility] Dr. Elesa Massed at [facility]   ruptured lumbar disc  1976   torn meniscus left knee  8/08   Dr. Madelon Lips    Current Medications: Current Meds  Medication Sig   apixaban (ELIQUIS) 5 MG TABS tablet Take 1 tablet (5 mg total) by mouth 2 (two) times daily.   atorvastatin (LIPITOR) 40 MG tablet TAKE 1 TABLET(40 MG) BY MOUTH DAILY AT 6 PM   brimonidine (ALPHAGAN P) 0.1 % SOLN Place 1 drop into both eyes at bedtime.   carvedilol (COREG) 12.5 MG tablet Take 1 tablet (12.5 mg total) by mouth 2 (two) times daily with a meal.   dapagliflozin propanediol (FARXIGA) 10 MG TABS tablet Take 1 tablet (10 mg total) by mouth daily before breakfast.   ENTRESTO 49-51 MG TAKE 1 TABLET BY MOUTH TWICE DAILY   furosemide (LASIX) 40 MG tablet TAKE 1 TABLET BY MOUTH AS NEEDED( WEIGHT INCREASE OF 3LBS OVERNIGHT OR 5LBS IN 1 WEEK) (Patient taking differently: Take 40 mg by mouth in the morning.)   spironolactone (ALDACTONE) 25 MG tablet TAKE 1/2 TABLET(12.5 MG) BY MOUTH DAILY   Tafamidis (VYNDAMAX)  61 MG CAPS Take 1 capsule (61 mg total) by mouth daily.     Allergies:   Shrimp [shellfish allergy], Erythromycin, Ivp dye [iodinated contrast media], and Penicillins   Social History   Socioeconomic History   Marital status: Married    Spouse name: Not on file   Number of children: 3   Years of education: Not on file   Highest education level: Not on file  Occupational History   Not on file  Tobacco Use   Smoking status: Never   Smokeless tobacco: Never  Substance and Sexual Activity   Alcohol use: Yes    Alcohol/week: 0.0 standard drinks of alcohol    Comment: occ   Drug use: No   Sexual activity: Not on file   Other Topics Concern   Not on file  Social History Narrative   Not on file   Social Determinants of Health   Financial Resource Strain: Not on file  Food Insecurity: Not on file  Transportation Needs: Not on file  Physical Activity: Not on file  Stress: Not on file  Social Connections: Not on file     Family History: The patient's family history includes Atrial fibrillation in his father; CAD in his father; Dementia in his mother; Glaucoma in an other family member; Heart disease in an other family member; Heart failure in his father; Hypertension in an other family member; Hypothyroidism in his sister; Stroke in an other family member.  ROS:   Please see the history of present illness.     All other systems reviewed and are negative.  EKGs/Labs/Other Studies Reviewed:    The following studies were reviewed today:   EKG:   12/05/2022: Atrial flutter with variable conduction, rate 60 11/02/2021: Sinus rhythm, first-degree AV block, rate 60, right axis deviation, Q waves in V1-3, I/aVL, T wave inversions in inferior leads 04/22/21: Sinus rhythm, first-degree AV block, rate 67, right axis deviation, Q waves in V1-4, I, aVL, T wave inversions in leads III, aVF  CTA chest 01/21/19: 1. Very tiny nonocclusive pulmonary embolus involving the right lower lobe as detailed above. 2. Overall findings most concerning for congestive heart failure including moderate-sized bilateral pleural effusions. 3. Mediastinal adenopathy, presumably reactive. 4. There is a 1 cm rounded nodular opacity involving the right lower lobe as detailed above. This may represent a focus of atelectasis, infiltrate, or pulmonary nodule. A three-month follow-up CT of the chest is recommended to confirm resolution of this finding. 5. Dilated ascending aorta measuring 4.2 cm. Recommend annual imaging followup by CTA or MRA. This recommendation follows 2010 ACCF/AHA/AATS/ACR/ASA/SCA/SCAI/SIR/STS/SVM Guidelines for  the Diagnosis and Management of Patients with Thoracic Aortic Disease. Circulation. 2010; 121: W102-V253. Aortic aneurysm NOS (ICD10-I71.9)  TTE 01/22/19:  1. The left ventricle has mild-moderately reduced systolic function, with an ejection fraction of 40-45%. The cavity size was normal. There is mild concentric left ventricular hypertrophy. Left ventricular diastolic Doppler parameters are consistent  with restrictive filling. Left ventrical global hypokinesis without regional wall motion abnormalities.  2. The right ventricle has mildly reduced systolic function. The cavity was mildly enlarged. There is no increase in right ventricular wall thickness. Right ventricular systolic pressure is mildly elevated with an estimated pressure of 41.0 mmHg.  3. Left atrial size was mildly dilated.  4. Right atrial size was mildly dilated.  5. Mitral valve regurgitation is mild to moderate by color flow Doppler. The MR jet is centrally-directed.  6. The aorta is normal unless otherwise noted.  7. The  inferior vena cava was dilated in size with <50% respiratory variability.  Coronary CTA 01/23/19: 1. Coronary calcium score of 0. This was 0 percentile for age and sex matched control. 2. Normal coronary origin with right dominance. 3. Nonobstructive CAD. There is noncalcified plaque in the proximal RCA causing ~50% stenosis. CTFFR across this lesion is 0.88, suggesting lesion is not functionally significant. CAD-RADS 3. Moderate stenosis. Consider symptom-guided anti-ischemic pharmacotherapy as well as risk factor modification per guideline directed care. Additional analysis with CT FFR will be submitted.   TTE 05/01/19:  1. In the setting of severe concentric left ventricular hypertrophy, pattern of restrictive diastolic filling, and increased RV wall thickeness, cannot exclude a diagnosis of cardiac amyloidosis or other infiltrative process.  2. Left ventricular ejection fraction, by 3D calculation, is  57%. The left ventricle has normal function. There is severely increased left ventricular hypertrophy.  3. Left ventricular diastolic parameters are consistent with Grade III diastolic dysfunction (restrictive).  4. Global right ventricle has normal systolic function.The right ventricular size is normal. Moderately increased right ventricular wall thickness.  5. Left atrial size was moderately dilated.  6. Right atrial size was mildly dilated.  7. The mitral valve is grossly normal. No evidence of mitral valve regurgitation. Mild mitral stenosis.  8. The tricuspid valve is normal in structure. Tricuspid valve regurgitation is trivial.  9. The aortic valve is tricuspid. Aortic valve regurgitation is trivial. No evidence of aortic valve sclerosis or stenosis. 10. The pulmonic valve was normal in structure. Pulmonic valve regurgitation is trivial. 11. Normal pulmonary artery systolic pressure. 12. The inferior vena cava is normal in size with <50% respiratory variability, suggesting right atrial pressure of 8 mmHg. 13. The left ventricle has no regional wall motion abnormalities.  Recent Labs: 11/07/2022: Hemoglobin 14.9; Magnesium 2.0; Platelets 155; TSH 0.926 11/15/2022: ALT 18; BUN 21; Creatinine, Ser 1.21; Potassium 4.6; Sodium 140  Recent Lipid Panel    Component Value Date/Time   CHOL 94 (L) 11/07/2022 1146   TRIG 69 11/07/2022 1146   HDL 33 (L) 11/07/2022 1146   CHOLHDL 2.8 11/07/2022 1146   CHOLHDL 4.5 01/22/2019 0307   VLDL 9 01/22/2019 0307   LDLCALC 46 11/07/2022 1146    Physical Exam:    VS:  BP 124/68   Pulse 60   Ht 6' (1.829 m)   Wt 233 lb 6.4 oz (105.9 kg)   SpO2 94%   BMI 31.65 kg/m     Wt Readings from Last 3 Encounters:  12/05/22 233 lb 6.4 oz (105.9 kg)  11/07/22 229 lb (103.9 kg)  04/26/22 226 lb (102.5 kg)     GEN: Well nourished, well developed in no acute distress HEENT: Normal NECK: No JVD CARDIAC: RRR, no murmurs, rubs, gallops RESPIRATORY:  Clear  to auscultation without rales, wheezing or rhonchi  ABDOMEN: Soft, non-tender, non-distended MUSCULOSKELETAL:  Trace LE edema SKIN: Warm and dry NEUROLOGIC:  Alert and oriented x 3 PSYCHIATRIC:  Normal affect   ASSESSMENT:    1. Atrial flutter, unspecified type (HCC)   2. Pre-procedure lab exam   3. Cardiac amyloidosis (HCC)   4. Chronic combined systolic and diastolic heart failure (HCC)   5. Essential hypertension   6. Coronary artery disease involving native coronary artery of native heart without angina pectoris   7. Hyperlipidemia, unspecified hyperlipidemia type      PLAN:    Atrial fibrillation/flutter: New diagnosis, noted to be in atrial flutter in clinic on 11/07/2022.  Rates appear well-controlled.  CHA2DS2-VASc 4 (CHF, hypertension, age, PE).  On follow-up EKG today, remains in atrial flutter -Continue Eliquis 5 mg twice daily -Rates well-controlled on carvedilol, would continue -Cardioversion scheduled for 7/16 with Dr Royann Shivers.  Patient reports compliance with Eliquis, has not missed any doses over the last 3 weeks  Cardiac amyloidosis: severe concentric hypertrophy and grade 3 diastolic dysfunction noted on echo. Unable to get MRI due to claustrophobia.  PYP scan on 03/10/2020 strongly suggestive of amyloidosis.  SPEP/UPEP unremarkable but abnormal light chains (kappa/lambda ratio 1.8).  Referred to hematology, work-up unremarkable, not suggestive of AL amyloidosis.  Findings consistent with ATTR amyloidosis.  Genetic testing was negative. -Continue tafamidis 61 mg daily   Chronic combined systolic and diastolic heart failure: Echo 01/23/19 with EF 40-45%, G3DD, diffuse LV hypokinesis without RWMA, mild concentric LV hypertrophy, mildly reduced RV systolic dysfunction, and mild-moderate MR.  Repeat Echo 05/01/19 showed normalization of LV systolic function, but severe concentric LVH.  Coronary CTA with nonobstructive CAD.    PYP scan consistent with amyloidosis as above.   Echocardiogram on 05/12/2021 showed EF 45 to 50%, severe LVH, normal RV function, severe left atrial dilatation, moderate right atrial dilatation, no significant valvular disease.  Echocardiogram 05/25/2022 showed EF 55 to 60%, severe LVH, normal RV function, severe biatrial enlargement, mild mitral regurgitation. - Appears euvolemic.  Continue Lasix 40 mg daily as needed if gains more than 3 pounds in 1 day or 5 pounds in 1 week - Continue Farxiga 10 mg daily - Continue spironolactone 12.5 mg - Continue Entresto 49-51 mg.   - Continue carvedilol 12.5 mg BID - Continue tafamidis for cardiac amyloidosis as above -Check BMET   Nonobstructive CAD: CT 01/2019 showed calcium score 0, but noncalcified plaque causing ~50% RCA stenosis (CTFFR 0.88, suggesting lesion is not functionally significant).  Denies any anginal symptoms - Atorvastatin 40 mg daily - Stopped aspirin given started Eliquis as above   Hypertension: Appears controlled -Continue Entresto and carvedilol   HLD: LDL 52 on 04/22/2021, on atorvastatin 40 mg daily.  LDL 04/26/2022 was 109, patient reported he had not been taking atorvastatin consistently.  Restarted atorvastatin 40 mg daily, LDL 46 on 11/07/2022   Thoracic aortic dilatation noted to have a 4.2cm dilation of the ascending aorta.  Repeat CTA chest on 03/09/2020 showed 3.9 cm ascending aorta.  Measured 3.7 cm on echocardiogram 05/2022  Pulmonary embolism/left lower extremity DVT: Diagnosed during hospitalization on 01/21/2019.  Started on Eliquis 5 mg twice daily.  PE thought to be provoked, due to sedentary lifestyle (working long hours as Airline pilot, sitting at desk all day).  Completed 16-month course of anticoagulation and was discontinued -Restarted Eliquis given new atrial flutter as above  Pulmonary nodule: 1 cm rounded nodular opacity involving the right lower lobe noted on CT chest 01/21/2019, which could represent atelectasis, infiltrate, or pulmonary nodule.  Follow-up CT  on 05/14/2019 showed resolution of nodule  RTC in 1 month   Medication Adjustments/Labs and Tests Ordered: Current medicines are reviewed at length with the patient today.  Concerns regarding medicines are outlined above.  Orders Placed This Encounter  Procedures   Basic Metabolic Panel (BMET)   CBC with Differential/Platelet   EKG 12-Lead     No orders of the defined types were placed in this encounter.    Patient Instructions  Medication Instructions:  Your physician recommends that you continue on your current medications as directed. Please refer to the Current Medication list given to you today.  *If you need  a refill on your cardiac medications before your next appointment, please call your pharmacy*   Lab Work: Your physician recommends that you have labs drawn today: BMET, CBC If you have labs (blood work) drawn today and your tests are completely normal, you will receive your results only by: MyChart Message (if you have MyChart) OR A paper copy in the mail If you have any lab test that is abnormal or we need to change your treatment, we will call you to review the results.   Testing/Procedures: Please proceed with Cardioversion tomorrow, July 16th   Follow-Up: At [facility], you and your health needs are our priority.  As part of our continuing mission to provide you with exceptional heart care, we have created designated Provider Care Teams.  These Care Teams include your primary Cardiologist (physician) and Advanced Practice Providers (APPs -  Physician Assistants and Nurse Practitioners) who all work together to provide you with the care you need, when you need it.   Your next appointment:    August 21st at 2:40pm   Provider:   Little Ishikawa, MD    Signed, Little Ishikawa, MD  12/05/2022 10:48 AM    [facility]

## 2022-12-05 ENCOUNTER — Encounter (HOSPITAL_COMMUNITY): Payer: Self-pay | Admitting: Anesthesiology

## 2022-12-05 ENCOUNTER — Ambulatory Visit: Payer: PPO | Attending: Cardiology | Admitting: Cardiology

## 2022-12-05 VITALS — BP 124/68 | HR 60 | Ht 72.0 in | Wt 233.4 lb

## 2022-12-05 DIAGNOSIS — I43 Cardiomyopathy in diseases classified elsewhere: Secondary | ICD-10-CM

## 2022-12-05 DIAGNOSIS — E854 Organ-limited amyloidosis: Secondary | ICD-10-CM | POA: Diagnosis not present

## 2022-12-05 DIAGNOSIS — I2510 Atherosclerotic heart disease of native coronary artery without angina pectoris: Secondary | ICD-10-CM

## 2022-12-05 DIAGNOSIS — E785 Hyperlipidemia, unspecified: Secondary | ICD-10-CM | POA: Diagnosis not present

## 2022-12-05 DIAGNOSIS — I4892 Unspecified atrial flutter: Secondary | ICD-10-CM

## 2022-12-05 DIAGNOSIS — I10 Essential (primary) hypertension: Secondary | ICD-10-CM | POA: Diagnosis not present

## 2022-12-05 DIAGNOSIS — Z01812 Encounter for preprocedural laboratory examination: Secondary | ICD-10-CM

## 2022-12-05 DIAGNOSIS — I5042 Chronic combined systolic (congestive) and diastolic (congestive) heart failure: Secondary | ICD-10-CM | POA: Diagnosis not present

## 2022-12-05 NOTE — Anesthesia Preprocedure Evaluation (Signed)
Anesthesia Evaluation    Reviewed: Allergy & Precautions, Patient's Chart, lab work & pertinent test results, Unable to perform ROS - Chart review only  Airway        Dental   Pulmonary neg pulmonary ROS          Cardiovascular hypertension, Pt. on home beta blockers and Pt. on medications +CHF  + Valvular Problems/Murmurs MR   Echo 05/2022  1. Left ventricular ejection fraction, by estimation, is 55 to 60%. The left ventricle has normal function. The left ventricle has no regional wall motion abnormalities. There is severe concentric left ventricular hypertrophy. Left ventricular diastolic parameters are indeterminate. The average left ventricular global longitudinal strain is -23.1 %. The global longitudinal strain is normal.   2. Right ventricular systolic function is normal. The right ventricular size is normal. There is normal pulmonary artery systolic pressure.   3. Left atrial size was severely dilated.   4. Right atrial size was severely dilated.   5. The mitral valve is normal in structure. Mild mitral valve regurgitation. No evidence of mitral stenosis.   6. The aortic valve is tricuspid. Aortic valve regurgitation is not visualized. No aortic stenosis is present.   7. The inferior vena cava is normal in size with greater than 50% respiratory variability, suggesting right atrial pressure of 3 mmHg.   Comparison(s): Prior images reviewed side by side. Changes from prior study are noted. EF, wall motion, and strain improved compared to prior study.     Neuro/Psych negative neurological ROS     GI/Hepatic negative GI ROS, Neg liver ROS,,,  Endo/Other  negative endocrine ROS    Renal/GU negative Renal ROS     Musculoskeletal negative musculoskeletal ROS (+)    Abdominal  (+) + obese  Peds  Hematology negative hematology ROS (+)   Anesthesia Other Findings   Reproductive/Obstetrics                              Anesthesia Physical Anesthesia Plan  ASA: 3  Anesthesia Plan: General   Post-op Pain Management: Minimal or no pain anticipated   Induction: Intravenous  PONV Risk Score and Plan: 2 and Propofol infusion, TIVA and Treatment may vary due to age or medical condition  Airway Management Planned: Natural Airway  Additional Equipment:   Intra-op Plan:   Post-operative Plan:   Informed Consent:   Plan Discussed with:   Anesthesia Plan Comments:         Anesthesia Quick Evaluation

## 2022-12-05 NOTE — Pre-Procedure Instructions (Addendum)
Voice message left for patient regarding procedure tomorrow. Instructed to arrive at hospital at [facility] Tuesday morning. NPO after midnight tonight. Instructions left on message that patient will need a ride home and have a responsible adult with him for 24 hours after the procedure; to not miss taking blood thinner, to take in the morning and to take with a sip of water. Also to take BP meds with sip of water. Message left to not take Farixga.

## 2022-12-05 NOTE — Patient Instructions (Addendum)
Medication Instructions:  Your physician recommends that you continue on your current medications as directed. Please refer to the Current Medication list given to you today.  *If you need a refill on your cardiac medications before your next appointment, please call your pharmacy*   Lab Work: Your physician recommends that you have labs drawn today: BMET, CBC If you have labs (blood work) drawn today and your tests are completely normal, you will receive your results only by: MyChart Message (if you have MyChart) OR A paper copy in the mail If you have any lab test that is abnormal or we need to change your treatment, we will call you to review the results.   Testing/Procedures: Please proceed with Cardioversion tomorrow, July 16th   Follow-Up: At [facility], you and your health needs are our priority.  As part of our continuing mission to provide you with exceptional heart care, we have created designated Provider Care Teams.  These Care Teams include your primary Cardiologist (physician) and Advanced Practice Providers (APPs -  Physician Assistants and Nurse Practitioners) who all work together to provide you with the care you need, when you need it.   Your next appointment:    August 21st at 2:40pm   Provider:   Little Ishikawa, MD

## 2022-12-06 ENCOUNTER — Encounter (HOSPITAL_COMMUNITY)
Admission: RE | Disposition: A | Discharge status: Home / Self Care | Payer: Self-pay | Source: Home / Self Care | Attending: Cardiovascular Disease

## 2022-12-06 ENCOUNTER — Ambulatory Visit (HOSPITAL_COMMUNITY)
Admission: RE | Admit: 2022-12-06 | Discharge: 2022-12-06 | Disposition: A | Discharge status: Home / Self Care | Payer: PPO | Attending: Cardiovascular Disease | Admitting: Cardiovascular Disease

## 2022-12-06 ENCOUNTER — Ambulatory Visit (HOSPITAL_BASED_OUTPATIENT_CLINIC_OR_DEPARTMENT_OTHER): Payer: PPO | Admitting: Anesthesiology

## 2022-12-06 ENCOUNTER — Other Ambulatory Visit: Payer: Self-pay

## 2022-12-06 ENCOUNTER — Ambulatory Visit (HOSPITAL_COMMUNITY): Payer: PPO | Admitting: Anesthesiology

## 2022-12-06 ENCOUNTER — Encounter (HOSPITAL_COMMUNITY): Payer: Self-pay | Admitting: Cardiovascular Disease

## 2022-12-06 DIAGNOSIS — I509 Heart failure, unspecified: Secondary | ICD-10-CM

## 2022-12-06 DIAGNOSIS — E854 Organ-limited amyloidosis: Secondary | ICD-10-CM | POA: Insufficient documentation

## 2022-12-06 DIAGNOSIS — Z79899 Other long term (current) drug therapy: Secondary | ICD-10-CM | POA: Diagnosis not present

## 2022-12-06 DIAGNOSIS — I43 Cardiomyopathy in diseases classified elsewhere: Secondary | ICD-10-CM | POA: Diagnosis not present

## 2022-12-06 DIAGNOSIS — Z86718 Personal history of other venous thrombosis and embolism: Secondary | ICD-10-CM | POA: Diagnosis not present

## 2022-12-06 DIAGNOSIS — Z7901 Long term (current) use of anticoagulants: Secondary | ICD-10-CM | POA: Diagnosis not present

## 2022-12-06 DIAGNOSIS — Z86711 Personal history of pulmonary embolism: Secondary | ICD-10-CM | POA: Diagnosis not present

## 2022-12-06 DIAGNOSIS — I2510 Atherosclerotic heart disease of native coronary artery without angina pectoris: Secondary | ICD-10-CM | POA: Diagnosis not present

## 2022-12-06 DIAGNOSIS — I351 Nonrheumatic aortic (valve) insufficiency: Secondary | ICD-10-CM

## 2022-12-06 DIAGNOSIS — E785 Hyperlipidemia, unspecified: Secondary | ICD-10-CM | POA: Insufficient documentation

## 2022-12-06 DIAGNOSIS — I4891 Unspecified atrial fibrillation: Secondary | ICD-10-CM

## 2022-12-06 DIAGNOSIS — I4819 Other persistent atrial fibrillation: Secondary | ICD-10-CM

## 2022-12-06 DIAGNOSIS — I4892 Unspecified atrial flutter: Secondary | ICD-10-CM | POA: Insufficient documentation

## 2022-12-06 DIAGNOSIS — I5042 Chronic combined systolic (congestive) and diastolic (congestive) heart failure: Secondary | ICD-10-CM | POA: Diagnosis not present

## 2022-12-06 DIAGNOSIS — I110 Hypertensive heart disease with heart failure: Secondary | ICD-10-CM

## 2022-12-06 HISTORY — PX: CARDIOVERSION: SHX1299

## 2022-12-06 LAB — BASIC METABOLIC PANEL
BUN/Creatinine Ratio: 18 (ref 10–24)
BUN: 20 mg/dL (ref 8–27)
CO2: 25 mmol/L (ref 20–29)
Calcium: 9.4 mg/dL (ref 8.6–10.2)
Chloride: 102 mmol/L (ref 96–106)
Creatinine, Ser: 1.13 mg/dL (ref 0.76–1.27)
Glucose: 111 mg/dL — ABNORMAL HIGH (ref 70–99)
Potassium: 4.4 mmol/L (ref 3.5–5.2)
Sodium: 140 mmol/L (ref 134–144)
eGFR: 72 mL/min/{1.73_m2} (ref >59)

## 2022-12-06 LAB — CBC WITH DIFFERENTIAL/PLATELET
Basophils Absolute: 0 10*3/uL (ref 0.0–0.2)
Basos: 0 %
EOS (ABSOLUTE): 0.1 10*3/uL (ref 0.0–0.4)
Eos: 1 %
Hematocrit: 42.2 % (ref 37.5–51.0)
Hemoglobin: 13.6 g/dL (ref 13.0–17.7)
Immature Grans (Abs): 0 10*3/uL (ref 0.0–0.1)
Immature Granulocytes: 1 %
Lymphocytes Absolute: 1.3 10*3/uL (ref 0.7–3.1)
Lymphs: 19 %
MCH: 28.3 pg (ref 26.6–33.0)
MCHC: 32.2 g/dL (ref 31.5–35.7)
MCV: 88 fL (ref 79–97)
Monocytes Absolute: 0.5 10*3/uL (ref 0.1–0.9)
Monocytes: 7 %
Neutrophils Absolute: 4.8 10*3/uL (ref 1.4–7.0)
Neutrophils: 72 %
Platelets: 183 10*3/uL (ref 150–450)
RBC: 4.81 x10E6/uL (ref 4.14–5.80)
RDW: 12.2 % (ref 11.6–15.4)
WBC: 6.6 10*3/uL (ref 3.4–10.8)

## 2022-12-06 SURGERY — CARDIOVERSION
Anesthesia: General

## 2022-12-06 SURGERY — CARDIOVERSION
Anesthesia: Monitor Anesthesia Care

## 2022-12-06 MED ORDER — LIDOCAINE 2% (20 MG/ML) 5 ML SYRINGE
INTRAMUSCULAR | Status: DC | PRN
  Administered 2022-12-06: 100 mg via INTRAVENOUS

## 2022-12-06 MED ORDER — SODIUM CHLORIDE 0.9 % IV SOLN: INTRAVENOUS | Status: DC

## 2022-12-06 MED ORDER — PROPOFOL 10 MG/ML IV BOLUS
INTRAVENOUS | Status: DC | PRN
  Administered 2022-12-06: 70 mg via INTRAVENOUS

## 2022-12-06 SURGICAL SUPPLY — 1 items: ELECT DEFIB PAD ADLT CADENCE (PAD) ×1 IMPLANT

## 2022-12-06 NOTE — Discharge Instructions (Signed)

## 2022-12-06 NOTE — Transfer of Care (Signed)
Immediate Anesthesia Transfer of Care Note  Patient: Andrew Vaughn  Procedure(s) Performed: CARDIOVERSION  Patient Location: Cath Lab  Anesthesia Type:General  Level of Consciousness: drowsy and patient cooperative  Airway & Oxygen Therapy: Patient Spontanous Breathing and Patient connected to nasal cannula oxygen  Post-op Assessment: Report given to RN and Post -op Vital signs reviewed and stable  Post vital signs: Reviewed and stable  Last Vitals:  Vitals Value Taken Time  BP 132/87 12/06/22 0730  Temp    Pulse 60 12/06/22 0733  Resp 13 12/06/22 0733  SpO2 99 % 12/06/22 0733    Last Pain:  Vitals:   12/06/22 0651  TempSrc:   PainSc: 0-No pain         Complications: No notable events documented.

## 2022-12-06 NOTE — Anesthesia Preprocedure Evaluation (Signed)
Anesthesia Evaluation  Patient identified by MRN, date of birth, ID band Patient awake    Reviewed: Allergy & Precautions, NPO status , Patient's Chart, lab work & pertinent test results  Airway Mallampati: II  TM Distance: >3 FB Neck ROM: Full    Dental no notable dental hx. (+) Dental Advisory Given, Teeth Intact   Pulmonary neg pulmonary ROS, neg recent URI   Pulmonary exam normal breath sounds clear to auscultation       Cardiovascular hypertension, Pt. on home beta blockers and Pt. on medications +CHF  + Valvular Problems/Murmurs MR  Rhythm:Irregular Rate:Normal  Echo 05/2022  1. Left ventricular ejection fraction, by estimation, is 55 to 60%. The left ventricle has normal function. The left ventricle has no regional wall motion abnormalities. There is severe concentric left ventricular hypertrophy. Left ventricular diastolic parameters are indeterminate. The average left ventricular global longitudinal strain is -23.1 %. The global longitudinal strain is normal.   2. Right ventricular systolic function is normal. The right ventricular size is normal. There is normal pulmonary artery systolic pressure.   3. Left atrial size was severely dilated.   4. Right atrial size was severely dilated.   5. The mitral valve is normal in structure. Mild mitral valve regurgitation. No evidence of mitral stenosis.   6. The aortic valve is tricuspid. Aortic valve regurgitation is not visualized. No aortic stenosis is present.   7. The inferior vena cava is normal in size with greater than 50% respiratory variability, suggesting right atrial pressure of 3 mmHg.   Comparison(s): Prior images reviewed side by side. Changes from prior study are noted. EF, wall motion, and strain improved compared to prior study.     Neuro/Psych negative neurological ROS     GI/Hepatic negative GI ROS, Neg liver ROS,,,  Endo/Other  negative endocrine ROS     Renal/GU negative Renal ROS     Musculoskeletal negative musculoskeletal ROS (+)    Abdominal  (+) + obese  Peds  Hematology negative hematology ROS (+)   Anesthesia Other Findings   Reproductive/Obstetrics                             Anesthesia Physical Anesthesia Plan  ASA: 3  Anesthesia Plan: General   Post-op Pain Management: Minimal or no pain anticipated   Induction: Intravenous  PONV Risk Score and Plan: 2 and Propofol infusion, TIVA and Treatment may vary due to age or medical condition  Airway Management Planned: Natural Airway  Additional Equipment:   Intra-op Plan:   Post-operative Plan:   Informed Consent: I have reviewed the patients History and Physical, chart, labs and discussed the procedure including the risks, benefits and alternatives for the proposed anesthesia with the patient or authorized representative who has indicated his/her understanding and acceptance.     Dental advisory given  Plan Discussed with: CRNA  Anesthesia Plan Comments:         Anesthesia Quick Evaluation

## 2022-12-06 NOTE — Op Note (Signed)
Procedure: Electrical Cardioversion Indications:  Atrial Fibrillation  Procedure Details:  Consent: Risks of procedure as well as the alternatives and risks of each were explained to the (patient/caregiver).  Consent for procedure obtained.  Time Out: Verified patient identification, verified procedure, site/side was marked, verified correct patient position, special equipment/implants available, medications/allergies/relevent history reviewed, required imaging and test results available.  Performed  Patient placed on cardiac monitor, pulse oximetry, supplemental oxygen as necessary.  Sedation given:  propofol 70 mg IV, Anesthesiology Pacer pads placed anterior and posterior chest.  Cardioverted 1 time(s).  Cardioversion with synchronized biphasic 150J shock.  Evaluation: Findings: Post procedure EKG shows: NSR. Frequent PACs Complications: None Patient did tolerate procedure well.  Time Spent Directly with the Patient:  30 minutes   Andrew Vaughn 12/06/2022, 7:34 AM

## 2022-12-06 NOTE — Interval H&P Note (Signed)
History and Physical Interval Note:  12/06/2022 7:21 AM  Andrew Vaughn  has presented today for surgery, with the diagnosis of afib.  The various methods of treatment have been discussed with the patient and family. After consideration of risks, benefits and other options for treatment, the patient has consented to  Procedure(s): CARDIOVERSION (N/A) as a surgical intervention.  The patient's history has been reviewed, patient examined, no change in status, stable for surgery.  I have reviewed the patient's chart and labs.  Questions were answered to the patient's satisfaction.     [name]

## 2022-12-07 ENCOUNTER — Other Ambulatory Visit (HOSPITAL_COMMUNITY): Payer: Self-pay

## 2022-12-07 ENCOUNTER — Encounter (HOSPITAL_COMMUNITY): Payer: Self-pay | Admitting: Cardiovascular Disease

## 2022-12-07 NOTE — Anesthesia Postprocedure Evaluation (Signed)
Anesthesia Post Note  Patient: Andrew Vaughn  Procedure(s) Performed: CARDIOVERSION     Patient location during evaluation: PACU Anesthesia Type: General Level of consciousness: sedated and patient cooperative Pain management: pain level controlled Vital Signs Assessment: post-procedure vital signs reviewed and stable Respiratory status: spontaneous breathing Cardiovascular status: stable Anesthetic complications: no   No notable events documented.  Last Vitals:  Vitals:   12/06/22 0742 12/06/22 0750  BP: 111/68 102/63  Pulse: (!) 55 (!) 51  Resp: 17 12  Temp:    SpO2: 97% 96%    Last Pain:  Vitals:   12/06/22 0741  TempSrc: Temporal  PainSc: 0-No pain                 Lewie Loron

## 2023-01-03 ENCOUNTER — Other Ambulatory Visit (HOSPITAL_COMMUNITY): Payer: Self-pay

## 2023-01-06 ENCOUNTER — Other Ambulatory Visit (HOSPITAL_COMMUNITY): Payer: Self-pay

## 2023-01-08 NOTE — Progress Notes (Unsigned)
Cardiology Office Note:    Date:  01/11/2023   ID:  Andrew Vaughn, DOB 1956/04/14, MRN [id number]  PCP:  Nelwyn Salisbury, MD  Cardiologist:  Little Ishikawa, MD  Electrophysiologist:  None   Referring MD: Nelwyn Salisbury, MD    Chief complaint: heart failure  History of Present Illness:    Andrew Vaughn is a 67 y.o. male with a hx of cardiac amyloidosis, chronic systolic heart failure, hypertension and glaucoma who presents for follow-up.  He was admitted to [facility] from 01/20/2022 to 01/24/2019.  He had presented with shortness of breath for the few months prior to admission.  In addition, he has been having intermittent fevers throughout the month of July, for which he completed a 9-day course of doxycycline.  Fevers resolved but he continued to have shortness of breath prompting his ED visit.  TTE on 01/22/2020 showed EF 40 to 45%, mild LVH, grade 3 diastolic dysfunction.  He was also found to have a PE.  Coronary CT showed nonobstructive CAD.  He was started on Entresto and carvedilol.  Repeat TTE on 05/01/2019 showed normalization of LV function, but severe LVH and grade 3 diastolic dysfunction.  PYP scan consistent with cardiac amyloidosis.  Echocardiogram on 05/12/2021 showed EF 45 to 50%, severe LVH, normal RV function, severe left atrial dilatation, moderate right atrial dilatation, no significant valvular disease.  Echocardiogram 05/25/2022 showed EF 55 to 60%, normal RV function, severe biatrial enlargement, mild mitral regurgitation.  Was found to be in atrial fibrillation and underwent successful cardioversion 12/06/2022.  Since last clinic visit, he reports he is doing well.  Went on a 1.5-hour hike over the weekend, reports improved exercise capacity since his cardioversion.  Denies any chest pain, dyspnea, lightheadedness, syncope, lower extremity edema, or palpitations.  Denies any bleeding on Eliquis.   Wt Readings from Last 3 Encounters:  01/11/23 231 lb 3.2 oz (104.9 kg)   12/06/22 230 lb (104.3 kg)  12/05/22 233 lb 6.4 oz (105.9 kg)     Past Medical History:  Diagnosis Date   Glaucoma    per Dr. Charlotte Sanes   Hypertension     Past Surgical History:  Procedure Laterality Date   CARDIOVERSION N/A 12/06/2022   Procedure: CARDIOVERSION;  Surgeon: Thurmon Fair, MD;  Location: [facility] INVASIVE CV LAB;  Service: Cardiovascular;  Laterality: N/A;   dislocated left shoulder  1993   right knee giant cell bone tumor  1998   per Dr. Elesa Massed at [facility]   ruptured lumbar disc  1976   torn meniscus left knee  8/08   Dr. Madelon Lips    Current Medications: Current Meds  Medication Sig   apixaban (ELIQUIS) 5 MG TABS tablet Take 1 tablet (5 mg total) by mouth 2 (two) times daily.   atorvastatin (LIPITOR) 40 MG tablet TAKE 1 TABLET(40 MG) BY MOUTH DAILY AT 6 PM   brimonidine (ALPHAGAN P) 0.1 % SOLN Place 1 drop into both eyes at bedtime.   carvedilol (COREG) 6.25 MG tablet Take 1 tablet (6.25 mg total) by mouth 2 (two) times daily.   dapagliflozin propanediol (FARXIGA) 10 MG TABS tablet Take 1 tablet (10 mg total) by mouth daily before breakfast.   ENTRESTO 49-51 MG TAKE 1 TABLET BY MOUTH TWICE DAILY   furosemide (LASIX) 40 MG tablet TAKE 1 TABLET BY MOUTH AS NEEDED( WEIGHT INCREASE OF 3LBS OVERNIGHT OR 5LBS IN 1 WEEK) (Patient taking differently: Take 40 mg by mouth in the morning.)   spironolactone (  ALDACTONE) 25 MG tablet TAKE 1/2 TABLET(12.5 MG) BY MOUTH DAILY   Tafamidis (VYNDAMAX) 61 MG CAPS Take 1 capsule (61 mg total) by mouth daily.   [DISCONTINUED] carvedilol (COREG) 12.5 MG tablet Take 1 tablet (12.5 mg total) by mouth 2 (two) times daily with a meal.     Allergies:   Shrimp [shellfish allergy], Erythromycin, Ivp dye [iodinated contrast media], and Penicillins   Social History   Socioeconomic History   Marital status: Married    Spouse name: Not on file   Number of children: 3   Years of education: Not on file   Highest education level: Not on file   Occupational History   Not on file  Tobacco Use   Smoking status: Never   Smokeless tobacco: Never  Vaping Use   Vaping status: Never Used  Substance and Sexual Activity   Alcohol use: Yes    Alcohol/week: 0.0 standard drinks of alcohol    Comment: occ   Drug use: No   Sexual activity: Not on file  Other Topics Concern   Not on file  Social History Narrative   Not on file   Social Determinants of Health   Financial Resource Strain: Not on file  Food Insecurity: Not on file  Transportation Needs: Not on file  Physical Activity: Not on file  Stress: Not on file  Social Connections: Not on file     Family History: The patient's family history includes Atrial fibrillation in his father; CAD in his father; Dementia in his mother; Glaucoma in an other family member; Heart disease in an other family member; Heart failure in his father; Hypertension in an other family member; Hypothyroidism in his sister; Stroke in an other family member.  ROS:   Please see the history of present illness.     All other systems reviewed and are negative.  EKGs/Labs/Other Studies Reviewed:    The following studies were reviewed today:   EKG:   01/11/23: Sinus bradycardia, first-degree AV block, rate 49 12/05/2022: Atrial flutter with variable conduction, rate 60 11/02/2021: Sinus rhythm, first-degree AV block, rate 60, right axis deviation, Q waves in V1-3, I/aVL, T wave inversions in inferior leads 04/22/21: Sinus rhythm, first-degree AV block, rate 67, right axis deviation, Q waves in V1-4, I, aVL, T wave inversions in leads III, aVF  CTA chest 01/21/19: 1. Very tiny nonocclusive pulmonary embolus involving the right lower lobe as detailed above. 2. Overall findings most concerning for congestive heart failure including moderate-sized bilateral pleural effusions. 3. Mediastinal adenopathy, presumably reactive. 4. There is a 1 cm rounded nodular opacity involving the right lower lobe as  detailed above. This may represent a focus of atelectasis, infiltrate, or pulmonary nodule. A three-month follow-up CT of the chest is recommended to confirm resolution of this finding. 5. Dilated ascending aorta measuring 4.2 cm. Recommend annual imaging followup by CTA or MRA. This recommendation follows 2010 ACCF/AHA/AATS/ACR/ASA/SCA/SCAI/SIR/STS/SVM Guidelines for the Diagnosis and Management of Patients with Thoracic Aortic Disease. Circulation. 2010; 121: O130-Q657. Aortic aneurysm NOS (ICD10-I71.9)  TTE 01/22/19:  1. The left ventricle has mild-moderately reduced systolic function, with an ejection fraction of 40-45%. The cavity size was normal. There is mild concentric left ventricular hypertrophy. Left ventricular diastolic Doppler parameters are consistent  with restrictive filling. Left ventrical global hypokinesis without regional wall motion abnormalities.  2. The right ventricle has mildly reduced systolic function. The cavity was mildly enlarged. There is no increase in right ventricular wall thickness. Right ventricular systolic pressure is  mildly elevated with an estimated pressure of 41.0 mmHg.  3. Left atrial size was mildly dilated.  4. Right atrial size was mildly dilated.  5. Mitral valve regurgitation is mild to moderate by color flow Doppler. The MR jet is centrally-directed.  6. The aorta is normal unless otherwise noted.  7. The inferior vena cava was dilated in size with <50% respiratory variability.  Coronary CTA 01/23/19: 1. Coronary calcium score of 0. This was 0 percentile for age and sex matched control. 2. Normal coronary origin with right dominance. 3. Nonobstructive CAD. There is noncalcified plaque in the proximal RCA causing ~50% stenosis. CTFFR across this lesion is 0.88, suggesting lesion is not functionally significant. CAD-RADS 3. Moderate stenosis. Consider symptom-guided anti-ischemic pharmacotherapy as well as risk factor modification per  guideline directed care. Additional analysis with CT FFR will be submitted.   TTE 05/01/19:  1. In the setting of severe concentric left ventricular hypertrophy, pattern of restrictive diastolic filling, and increased RV wall thickeness, cannot exclude a diagnosis of cardiac amyloidosis or other infiltrative process.  2. Left ventricular ejection fraction, by 3D calculation, is 57%. The left ventricle has normal function. There is severely increased left ventricular hypertrophy.  3. Left ventricular diastolic parameters are consistent with Grade III diastolic dysfunction (restrictive).  4. Global right ventricle has normal systolic function.The right ventricular size is normal. Moderately increased right ventricular wall thickness.  5. Left atrial size was moderately dilated.  6. Right atrial size was mildly dilated.  7. The mitral valve is grossly normal. No evidence of mitral valve regurgitation. Mild mitral stenosis.  8. The tricuspid valve is normal in structure. Tricuspid valve regurgitation is trivial.  9. The aortic valve is tricuspid. Aortic valve regurgitation is trivial. No evidence of aortic valve sclerosis or stenosis. 10. The pulmonic valve was normal in structure. Pulmonic valve regurgitation is trivial. 11. Normal pulmonary artery systolic pressure. 12. The inferior vena cava is normal in size with <50% respiratory variability, suggesting right atrial pressure of 8 mmHg. 13. The left ventricle has no regional wall motion abnormalities.  Recent Labs: 11/07/2022: Magnesium 2.0; TSH 0.926 11/15/2022: ALT 18 12/05/2022: BUN 20; Creatinine, Ser 1.13; Hemoglobin 13.6; Platelets 183; Potassium 4.4; Sodium 140  Recent Lipid Panel    Component Value Date/Time   CHOL 94 (L) 11/07/2022 1146   TRIG 69 11/07/2022 1146   HDL 33 (L) 11/07/2022 1146   CHOLHDL 2.8 11/07/2022 1146   CHOLHDL 4.5 01/22/2019 0307   VLDL 9 01/22/2019 0307   LDLCALC 46 11/07/2022 1146    Physical Exam:     VS:  BP 112/70   Pulse (!) 50   Ht 6' (1.829 m)   Wt 231 lb 3.2 oz (104.9 kg)   SpO2 98%   BMI 31.36 kg/m     Wt Readings from Last 3 Encounters:  01/11/23 231 lb 3.2 oz (104.9 kg)  12/06/22 230 lb (104.3 kg)  12/05/22 233 lb 6.4 oz (105.9 kg)     GEN: Well nourished, well developed in no acute distress HEENT: Normal NECK: No JVD CARDIAC: Regular, bradycardic, no murmurs, rubs, gallops RESPIRATORY:  Clear to auscultation without rales, wheezing or rhonchi  ABDOMEN: Soft, non-tender, non-distended MUSCULOSKELETAL:  Trace LE edema SKIN: Warm and dry NEUROLOGIC:  Alert and oriented x 3 PSYCHIATRIC:  Normal affect   ASSESSMENT:    1. Atrial flutter, unspecified type (HCC)   2. Persistent atrial fibrillation (HCC)   3. Cardiac amyloidosis (HCC)   4. Chronic combined systolic  and diastolic heart failure (HCC)       PLAN:    Atrial fibrillation/flutter: New diagnosis, noted to be in atrial flutter in clinic on 11/07/2022.  Rates appear well-controlled.  CHA2DS2-VASc 4 (CHF, hypertension, age, PE).  Underwent successful cardioversion 12/06/2022 -Continue Eliquis 5 mg twice daily -Continue carvedilol, will decrease dose to 6.25 mg twice daily given bradycardia -Recommend Kardia mobile device for monitoring for recurrent A-fib  Cardiac amyloidosis: severe concentric hypertrophy and grade 3 diastolic dysfunction noted on echo. Unable to get MRI due to claustrophobia.  PYP scan on 03/10/2020 strongly suggestive of amyloidosis.  SPEP/UPEP unremarkable but abnormal light chains (kappa/lambda ratio 1.8).  Referred to hematology, work-up unremarkable, not suggestive of AL amyloidosis.  Findings consistent with ATTR amyloidosis.  Genetic testing was negative. -Continue tafamidis 61 mg daily   Chronic combined systolic and diastolic heart failure: Echo 01/23/19 with EF 40-45%, G3DD, diffuse LV hypokinesis without RWMA, mild concentric LV hypertrophy, mildly reduced RV systolic dysfunction,  and mild-moderate MR.  Repeat Echo 05/01/19 showed normalization of LV systolic function, but severe concentric LVH.  Coronary CTA with nonobstructive CAD.    PYP scan consistent with amyloidosis as above.  Echocardiogram on 05/12/2021 showed EF 45 to 50%, severe LVH, normal RV function, severe left atrial dilatation, moderate right atrial dilatation, no significant valvular disease.  Echocardiogram 05/25/2022 showed EF 55 to 60%, severe LVH, normal RV function, severe biatrial enlargement, mild mitral regurgitation. - Appears euvolemic.  Continue Lasix 40 mg daily as needed if gains more than 3 pounds in 1 day or 5 pounds in 1 week - Continue Farxiga 10 mg daily - Continue spironolactone 12.5 mg - Continue Entresto 49-51 mg.   - Continue carvedilol, will decrease dose to 625 mg twice daily given low heart rate - Continue tafamidis for cardiac amyloidosis as above -Check BMET   Nonobstructive CAD: CT 01/2019 showed calcium score 0, but noncalcified plaque causing ~50% RCA stenosis (CTFFR 0.88, suggesting lesion is not functionally significant).  Denies any anginal symptoms - Atorvastatin 40 mg daily - Stopped aspirin given started Eliquis as above   Hypertension: Appears controlled -Continue Entresto and carvedilol   HLD: LDL 52 on 04/22/2021, on atorvastatin 40 mg daily.  LDL 04/26/2022 was 109, patient reported he had not been taking atorvastatin consistently.  Restarted atorvastatin 40 mg daily, LDL 46 on 11/07/2022   Thoracic aortic dilatation noted to have a 4.2cm dilation of the ascending aorta.  Repeat CTA chest on 03/09/2020 showed 3.9 cm ascending aorta.  Measured 3.7 cm on echocardiogram 05/2022  Pulmonary embolism/left lower extremity DVT: Diagnosed during hospitalization on 01/21/2019.  Started on Eliquis 5 mg twice daily.  PE thought to be provoked, due to sedentary lifestyle (working long hours as Airline pilot, sitting at desk all day).  Completed 85-month course of anticoagulation and was  discontinued -Restarted Eliquis given new atrial flutter as above  Pulmonary nodule: 1 cm rounded nodular opacity involving the right lower lobe noted on CT chest 01/21/2019, which could represent atelectasis, infiltrate, or pulmonary nodule.  Follow-up CT on 05/14/2019 showed resolution of nodule  RTC in 3 months   Medication Adjustments/Labs and Tests Ordered: Current medicines are reviewed at length with the patient today.  Concerns regarding medicines are outlined above.  Orders Placed This Encounter  Procedures   EKG 12-Lead     Meds ordered this encounter  Medications   carvedilol (COREG) 6.25 MG tablet    Sig: Take 1 tablet (6.25 mg total) by mouth 2 (two)  times daily.    Dispense:  180 tablet    Refill:  3     Patient Instructions  Medication Instructions:  Your physician has recommended you make the following change in your medication:   - Carvedilol (Coreg) 6.25mg  twice daily.   *If you need a refill on your cardiac medications before your next appointment, please call your pharmacy*    Follow-Up: At [facility], you and your health needs are our priority.  As part of our continuing mission to provide you with exceptional heart care, we have created designated Provider Care Teams.  These Care Teams include your primary Cardiologist (physician) and Advanced Practice Providers (APPs -  Physician Assistants and Nurse Practitioners) who all work together to provide you with the care you need, when you need it.  We recommend signing up for the patient portal called "MyChart".  Sign up information is provided on this After Visit Summary.  MyChart is used to connect with patients for Virtual Visits (Telemedicine).  Patients are able to view lab/test results, encounter notes, upcoming appointments, etc.  Non-urgent messages can be sent to your provider as well.   To learn more about what you can do with MyChart, go to [email or web address].    Your next  appointment:   3 month(s)  Provider:   Little Ishikawa, MD      [facility] [employer]: Website: [email or web address]  DR.Panayiotis [place] RECOMMENDS YOU PURCHASE  " Kardia" By [employer]  [employer] FROM THE  GOOGLE[employer]  APP PLAY STORE.  THE APP IS FREE , BUT THE  EQUIPMENT HAS A COST. IT ALLOWS YOU TO OBTAIN A RECORDING OF YOUR HEART RATE AND RHYTHM BY PROVIDING A SHORT STRIP THAT YOU CAN SHARE WITH YOUR PROVIDER.     [facility] - sending an EKG Download app and set up profile. Run EKG - by placing 1-2 fingers on the silver plates After EKG is complete - Download PDF  - Skip password (if you apply a password the provider will need it to view the EKG) Click share button (square with upward arrow) in bottom left corner To send: choose MyChart (first time log into MyChart)  Pop up window about sending ECG Click continue Choose type of message Choose provider Type subject and message Click send (EKG should be attached)  - To send additional EKGs in one message click the paperclip image and bottom of page to attach.          Signed, Little Ishikawa, MD  01/11/2023 4:11 PM    [facility]

## 2023-01-09 ENCOUNTER — Other Ambulatory Visit (HOSPITAL_COMMUNITY): Payer: Self-pay

## 2023-01-11 ENCOUNTER — Ambulatory Visit: Payer: PPO | Attending: Cardiology | Admitting: Cardiology

## 2023-01-11 VITALS — BP 112/70 | HR 50 | Ht 72.0 in | Wt 231.2 lb

## 2023-01-11 DIAGNOSIS — I4892 Unspecified atrial flutter: Secondary | ICD-10-CM | POA: Diagnosis not present

## 2023-01-11 DIAGNOSIS — I5042 Chronic combined systolic (congestive) and diastolic (congestive) heart failure: Secondary | ICD-10-CM

## 2023-01-11 DIAGNOSIS — E854 Organ-limited amyloidosis: Secondary | ICD-10-CM

## 2023-01-11 DIAGNOSIS — I4819 Other persistent atrial fibrillation: Secondary | ICD-10-CM | POA: Diagnosis not present

## 2023-01-11 DIAGNOSIS — I43 Cardiomyopathy in diseases classified elsewhere: Secondary | ICD-10-CM | POA: Diagnosis not present

## 2023-01-11 MED ORDER — CARVEDILOL 6.25 MG PO TABS: 6.2500 mg | ORAL_TABLET | Freq: Two times a day (BID) | ORAL | 3 refills | Status: DC

## 2023-01-11 NOTE — Patient Instructions (Signed)
Medication Instructions:  Your physician has recommended you make the following change in your medication:   - Carvedilol (Coreg) 6.25mg  twice daily.   *If you need a refill on your cardiac medications before your next appointment, please call your pharmacy*    Follow-Up: At [facility], you and your health needs are our priority.  As part of our continuing mission to provide you with exceptional heart care, we have created designated Provider Care Teams.  These Care Teams include your primary Cardiologist (physician) and Advanced Practice Providers (APPs -  Physician Assistants and Nurse Practitioners) who all work together to provide you with the care you need, when you need it.  We recommend signing up for the patient portal called "MyChart".  Sign up information is provided on this After Visit Summary.  MyChart is used to connect with patients for Virtual Visits (Telemedicine).  Patients are able to view lab/test results, encounter notes, upcoming appointments, etc.  Non-urgent messages can be sent to your provider as well.   To learn more about what you can do with MyChart, go to [email or web address].    Your next appointment:   3 month(s)  Provider:   Little Ishikawa, MD      [facility] [employer]: Website: [email or web address]  DR.SCHUMANN RECOMMENDS YOU PURCHASE  " Kardia" By [employer]  [employer] FROM THE  G[employer]  APP PLAY STORE.  THE APP IS FREE , BUT THE  EQUIPMENT HAS A COST. IT ALLOWS YOU TO OBTAIN A RECORDING OF YOUR HEART RATE AND RHYTHM BY PROVIDING A SHORT STRIP THAT YOU CAN SHARE WITH YOUR PROVIDER.     [facility] - sending an EKG Download app and set up profile. Run EKG - by placing 1-2 fingers on the silver plates After EKG is complete - Download PDF  - Skip password (if you apply a password the provider will need it to view the EKG) Click share button (square with upward arrow) in bottom left corner To send: choose MyChart  (first time log into MyChart)  Pop up window about sending ECG Click continue Choose type of message Choose provider Type subject and message Click send (EKG should be attached)  - To send additional EKGs in one message click the paperclip image and bottom of page to attach.

## 2023-01-19 ENCOUNTER — Other Ambulatory Visit: Payer: Self-pay | Admitting: Cardiology

## 2023-01-21 ENCOUNTER — Other Ambulatory Visit: Payer: Self-pay | Admitting: Cardiology

## 2023-01-30 ENCOUNTER — Other Ambulatory Visit (HOSPITAL_COMMUNITY): Payer: Self-pay

## 2023-02-01 ENCOUNTER — Other Ambulatory Visit: Payer: Self-pay

## 2023-02-03 ENCOUNTER — Other Ambulatory Visit (HOSPITAL_COMMUNITY): Payer: Self-pay

## 2023-02-03 ENCOUNTER — Encounter (HOSPITAL_COMMUNITY): Payer: Self-pay

## 2023-02-07 DIAGNOSIS — H401132 Primary open-angle glaucoma, bilateral, moderate stage: Secondary | ICD-10-CM | POA: Diagnosis not present

## 2023-02-07 DIAGNOSIS — D225 Melanocytic nevi of trunk: Secondary | ICD-10-CM | POA: Diagnosis not present

## 2023-02-07 DIAGNOSIS — Z85828 Personal history of other malignant neoplasm of skin: Secondary | ICD-10-CM | POA: Diagnosis not present

## 2023-02-07 DIAGNOSIS — L821 Other seborrheic keratosis: Secondary | ICD-10-CM | POA: Diagnosis not present

## 2023-02-07 DIAGNOSIS — L720 Epidermal cyst: Secondary | ICD-10-CM | POA: Diagnosis not present

## 2023-02-07 DIAGNOSIS — L578 Other skin changes due to chronic exposure to nonionizing radiation: Secondary | ICD-10-CM | POA: Diagnosis not present

## 2023-02-07 DIAGNOSIS — L812 Freckles: Secondary | ICD-10-CM | POA: Diagnosis not present

## 2023-02-07 DIAGNOSIS — D1801 Hemangioma of skin and subcutaneous tissue: Secondary | ICD-10-CM | POA: Diagnosis not present

## 2023-02-07 DIAGNOSIS — L570 Actinic keratosis: Secondary | ICD-10-CM | POA: Diagnosis not present

## 2023-02-09 ENCOUNTER — Other Ambulatory Visit (HOSPITAL_COMMUNITY): Payer: Self-pay

## 2023-02-11 ENCOUNTER — Encounter (HOSPITAL_COMMUNITY): Payer: Self-pay

## 2023-02-14 NOTE — Progress Notes (Signed)
02/14/2023  Patient ID: Andrew Vaughn, male   DOB: [birth year], 67 y.o.   MRN: [id number]  Pharmacy Quality Measure Review  This patient is appearing on a report for being at risk of failing the adherence measure for cholesterol (statin) medications this calendar year.   Medication: Atorvastatin Last fill date: 12/31/22 for 90 day supply  Insurance report was not up to date. No action needed at this time.   Sherrill Raring, PharmD Clinical Pharmacist [phone]

## 2023-03-02 ENCOUNTER — Other Ambulatory Visit (HOSPITAL_COMMUNITY): Payer: Self-pay

## 2023-03-06 ENCOUNTER — Other Ambulatory Visit: Payer: Self-pay

## 2023-03-08 ENCOUNTER — Other Ambulatory Visit: Payer: Self-pay

## 2023-03-20 ENCOUNTER — Other Ambulatory Visit: Payer: Self-pay

## 2023-03-20 NOTE — Progress Notes (Signed)
Specialty Pharmacy Refill Coordination Note  Andrew Vaughn is a 67 y.o. male contacted today regarding refills of specialty medication(s) Tafamidis   Patient requested Delivery   Delivery date: 03/22/23   Verified address: [address]   [name] [phone]   Medication will be filled on 03/21/23.

## 2023-04-06 ENCOUNTER — Other Ambulatory Visit: Payer: Self-pay

## 2023-04-10 ENCOUNTER — Other Ambulatory Visit: Payer: Self-pay

## 2023-04-12 ENCOUNTER — Other Ambulatory Visit: Payer: Self-pay

## 2023-04-23 NOTE — Progress Notes (Unsigned)
Cardiology Office Note:    Date:  04/25/2023   ID:  Andrew Vaughn, DOB 15-Jul-1955, MRN [id number]  PCP:  Nelwyn Salisbury, MD  Cardiologist:  Little Ishikawa, MD  Electrophysiologist:  None   Referring MD: Nelwyn Salisbury, MD    Chief complaint: heart failure  History of Present Illness:    Andrew Vaughn is a 67 y.o. male with a hx of cardiac amyloidosis, chronic systolic heart failure, hypertension and glaucoma who presents for follow-up.  He was admitted to [facility] from 01/20/2022 to 01/24/2019.  He had presented with shortness of breath for the few months prior to admission.  In addition, he has been having intermittent fevers throughout the month of July, for which he completed a 9-day course of doxycycline.  Fevers resolved but he continued to have shortness of breath prompting his ED visit.  TTE on 01/22/2020 showed EF 40 to 45%, mild LVH, grade 3 diastolic dysfunction.  He was also found to have a PE.  Coronary CT showed nonobstructive CAD.  He was started on Entresto and carvedilol.  Repeat TTE on 05/01/2019 showed normalization of LV function, but severe LVH and grade 3 diastolic dysfunction.  PYP scan consistent with cardiac amyloidosis.  Echocardiogram on 05/12/2021 showed EF 45 to 50%, severe LVH, normal RV function, severe left atrial dilatation, moderate right atrial dilatation, no significant valvular disease.  Echocardiogram 05/25/2022 showed EF 55 to 60%, normal RV function, severe biatrial enlargement, mild mitral regurgitation.  Was found to be in atrial fibrillation and underwent successful cardioversion 12/06/2022.  Since last clinic visit, he reports he is doing well.  Reports occasional lightheadedness but denies any syncope.  Denies any chest pain, dyspnea, lower extreme edema, or palpitations.  Walks regularly.  Denies any bleeding issues on Eliquis.  He is taking Lasix about 5 days/week.  Wt Readings from Last 3 Encounters:  04/25/23 232 lb 3.2 oz (105.3 kg)  01/11/23  231 lb 3.2 oz (104.9 kg)  12/06/22 230 lb (104.3 kg)     Past Medical History:  Diagnosis Date   Glaucoma    per Dr. Charlotte Sanes   Hypertension     Past Surgical History:  Procedure Laterality Date   CARDIOVERSION N/A 12/06/2022   Procedure: CARDIOVERSION;  Surgeon: Thurmon Fair, MD;  Location: [facility] INVASIVE CV LAB;  Service: Cardiovascular;  Laterality: N/A;   dislocated left shoulder  1993   right knee giant cell bone tumor  1998   [facility] Dr. Elesa Massed [facility]   ruptured lumbar disc  1976   torn meniscus left knee  8/08   Dr. Madelon Lips    Current Medications: Current Meds  Medication Sig   apixaban (ELIQUIS) 5 MG TABS tablet Take 1 tablet (5 mg total) by mouth 2 (two) times daily.   atorvastatin (LIPITOR) 40 MG tablet TAKE 1 TABLET(40 MG) BY MOUTH DAILY AT 6 PM   brimonidine (ALPHAGAN P) 0.1 % SOLN Place 1 drop into both eyes at bedtime.   carvedilol (COREG) 6.25 MG tablet Take 1 tablet (6.25 mg total) by mouth 2 (two) times daily.   dapagliflozin propanediol (FARXIGA) 10 MG TABS tablet Take 1 tablet (10 mg total) by mouth daily before breakfast.   ENTRESTO 49-51 MG TAKE 1 TABLET BY MOUTH TWICE DAILY   furosemide (LASIX) 40 MG tablet TAKE 1 TABLET BY MOUTH DAILY AS NEEDED(WEIGHT INCREASE OF 3LBS OVERNIGHT OR 5LBS IN ONE WEEK)   spironolactone (ALDACTONE) 25 MG tablet TAKE 1/2 TABLET(12.5 MG) BY MOUTH  DAILY   Tafamidis (VYNDAMAX) 61 MG CAPS Take 1 capsule (61 mg total) by mouth daily.     Allergies:   Shrimp [shellfish allergy], Erythromycin, Ivp dye [iodinated contrast media], and Penicillins   Social History   Socioeconomic History   Marital status: Married    Spouse name: Not on file   Number of children: 3   Years of education: Not on file   Highest education level: Not on file  Occupational History   Not on file  Tobacco Use   Smoking status: Never   Smokeless tobacco: Never  Vaping Use   Vaping status: Never Used  Substance and Sexual Activity   Alcohol  use: Yes    Alcohol/week: 0.0 standard drinks of alcohol    Comment: occ   Drug use: No   Sexual activity: Not on file  Other Topics Concern   Not on file  Social History Narrative   Not on file   Social Determinants of Health   Financial Resource Strain: Not on file  Food Insecurity: Not on file  Transportation Needs: Not on file  Physical Activity: Not on file  Stress: Not on file  Social Connections: Not on file     Family History: The patient's family history includes Atrial fibrillation in his father; CAD in his father; Dementia in his mother; Glaucoma in an other family member; Heart disease in an other family member; Heart failure in his father; Hypertension in an other family member; Hypothyroidism in his sister; Stroke in an other family member.  ROS:   Please see the history of present illness.     All other systems reviewed and are negative.  EKGs/Labs/Other Studies Reviewed:    The following studies were reviewed today:   EKG:   04/25/23: Atrial fibrillation, rate 62 01/11/23: Sinus bradycardia, first-degree AV block, rate 49 12/05/2022: Atrial flutter with variable conduction, rate 60 11/02/2021: Sinus rhythm, first-degree AV block, rate 60, right axis deviation, Q waves in V1-3, I/aVL, T wave inversions in inferior leads 04/22/21: Sinus rhythm, first-degree AV block, rate 67, right axis deviation, Q waves in V1-4, I, aVL, T wave inversions in leads III, aVF  CTA chest 01/21/19: 1. Very tiny nonocclusive pulmonary embolus involving the right lower lobe as detailed above. 2. Overall findings most concerning for congestive heart failure including moderate-sized bilateral pleural effusions. 3. Mediastinal adenopathy, presumably reactive. 4. There is a 1 cm rounded nodular opacity involving the right lower lobe as detailed above. This may represent a focus of atelectasis, infiltrate, or pulmonary nodule. A three-month follow-up CT of the chest is recommended to  confirm resolution of this finding. 5. Dilated ascending aorta measuring 4.2 cm. Recommend annual imaging followup by CTA or MRA. This recommendation follows 2010 ACCF/AHA/AATS/ACR/ASA/SCA/SCAI/SIR/STS/SVM Guidelines for the Diagnosis and Management of Patients with Thoracic Aortic Disease. Circulation. 2010; 121: Z610-R604. Aortic aneurysm NOS (ICD10-I71.9)  TTE 01/22/19:  1. The left ventricle has mild-moderately reduced systolic function, with an ejection fraction of 40-45%. The cavity size was normal. There is mild concentric left ventricular hypertrophy. Left ventricular diastolic Doppler parameters are consistent  with restrictive filling. Left ventrical global hypokinesis without regional wall motion abnormalities.  2. The right ventricle has mildly reduced systolic function. The cavity was mildly enlarged. There is no increase in right ventricular wall thickness. Right ventricular systolic pressure is mildly elevated with an estimated pressure of 41.0 mmHg.  3. Left atrial size was mildly dilated.  4. Right atrial size was mildly dilated.  5. Mitral  valve regurgitation is mild to moderate by color flow Doppler. The MR jet is centrally-directed.  6. The aorta is normal unless otherwise noted.  7. The inferior vena cava was dilated in size with <50% respiratory variability.  Coronary CTA 01/23/19: 1. Coronary calcium score of 0. This was 0 percentile for age and sex matched control. 2. Normal coronary origin with right dominance. 3. Nonobstructive CAD. There is noncalcified plaque in the proximal RCA causing ~50% stenosis. CTFFR across this lesion is 0.88, suggesting lesion is not functionally significant. CAD-RADS 3. Moderate stenosis. Consider symptom-guided anti-ischemic pharmacotherapy as well as risk factor modification per guideline directed care. Additional analysis with CT FFR will be submitted.   TTE 05/01/19:  1. In the setting of severe concentric left ventricular  hypertrophy, pattern of restrictive diastolic filling, and increased RV wall thickeness, cannot exclude a diagnosis of cardiac amyloidosis or other infiltrative process.  2. Left ventricular ejection fraction, by 3D calculation, is 57%. The left ventricle has normal function. There is severely increased left ventricular hypertrophy.  3. Left ventricular diastolic parameters are consistent with Grade III diastolic dysfunction (restrictive).  4. Global right ventricle has normal systolic function.The right ventricular size is normal. Moderately increased right ventricular wall thickness.  5. Left atrial size was moderately dilated.  6. Right atrial size was mildly dilated.  7. The mitral valve is grossly normal. No evidence of mitral valve regurgitation. Mild mitral stenosis.  8. The tricuspid valve is normal in structure. Tricuspid valve regurgitation is trivial.  9. The aortic valve is tricuspid. Aortic valve regurgitation is trivial. No evidence of aortic valve sclerosis or stenosis. 10. The pulmonic valve was normal in structure. Pulmonic valve regurgitation is trivial. 11. Normal pulmonary artery systolic pressure. 12. The inferior vena cava is normal in size with <50% respiratory variability, suggesting right atrial pressure of 8 mmHg. 13. The left ventricle has no regional wall motion abnormalities.  Recent Labs: 11/07/2022: Magnesium 2.0; TSH 0.926 11/15/2022: ALT 18 12/05/2022: BUN 20; Creatinine, Ser 1.13; Hemoglobin 13.6; Platelets 183; Potassium 4.4; Sodium 140  Recent Lipid Panel    Component Value Date/Time   CHOL 94 (L) 11/07/2022 1146   TRIG 69 11/07/2022 1146   HDL 33 (L) 11/07/2022 1146   CHOLHDL 2.8 11/07/2022 1146   CHOLHDL 4.5 01/22/2019 0307   VLDL 9 01/22/2019 0307   LDLCALC 46 11/07/2022 1146    Physical Exam:    VS:  BP 110/78 (BP Location: Left Arm, Patient Position: Sitting, Cuff Size: Normal)   Pulse 73   Ht 6' (1.829 m)   Wt 232 lb 3.2 oz (105.3 kg)   SpO2  98%   BMI 31.49 kg/m     Wt Readings from Last 3 Encounters:  04/25/23 232 lb 3.2 oz (105.3 kg)  01/11/23 231 lb 3.2 oz (104.9 kg)  12/06/22 230 lb (104.3 kg)     GEN: Well nourished, well developed in no acute distress HEENT: Normal NECK: No JVD CARDIAC: Regular, bradycardic, no murmurs, rubs, gallops RESPIRATORY:  Clear to auscultation without rales, wheezing or rhonchi  ABDOMEN: Soft, non-tender, non-distended MUSCULOSKELETAL:  Trace LE edema SKIN: Warm and dry NEUROLOGIC:  Alert and oriented x 3 PSYCHIATRIC:  Normal affect   ASSESSMENT:    1. Atrial flutter, unspecified type (HCC)   2. Cardiac amyloidosis (HCC)   3. Essential hypertension   4. Hyperlipidemia, unspecified hyperlipidemia type   5. Chronic combined systolic and diastolic heart failure (HCC)       PLAN:  Atrial fibrillation/flutter: New diagnosis, noted to be in atrial flutter in clinic on 11/07/2022.  Rates appear well-controlled.  CHA2DS2-VASc 4 (CHF, hypertension, age, PE).  Underwent successful cardioversion 12/06/2022 -Continue Eliquis 5 mg twice daily -Continue carvedilol 6.25 mg twice daily  -He is back in A-fib in clinic today.  Rates appear well-controlled.  Recommend evaluation in Afib clinic to consider antiarrhythmic vs ablation  Cardiac amyloidosis: severe concentric hypertrophy and grade 3 diastolic dysfunction noted on echo. Unable to get MRI due to claustrophobia.  PYP scan on 03/10/2020 strongly suggestive of amyloidosis.  SPEP/UPEP unremarkable but abnormal light chains (kappa/lambda ratio 1.8).  Referred to hematology, work-up unremarkable, not suggestive of AL amyloidosis.  Findings consistent with ATTR amyloidosis.  Genetic testing was negative. -Continue tafamidis 61 mg daily   Chronic combined systolic and diastolic heart failure: Echo 01/23/19 with EF 40-45%, G3DD, diffuse LV hypokinesis without RWMA, mild concentric LV hypertrophy, mildly reduced RV systolic dysfunction, and  mild-moderate MR.  Repeat Echo 05/01/19 showed normalization of LV systolic function, but severe concentric LVH.  Coronary CTA with nonobstructive CAD.    PYP scan consistent with amyloidosis as above.  Echocardiogram on 05/12/2021 showed EF 45 to 50%, severe LVH, normal RV function, severe left atrial dilatation, moderate right atrial dilatation, no significant valvular disease.  Echocardiogram 05/25/2022 showed EF 55 to 60%, severe LVH, normal RV function, severe biatrial enlargement, mild mitral regurgitation. - Appears euvolemic.  Continue Lasix 40 mg daily as needed if gains more than 3 pounds in 1 day or 5 pounds in 1 week - Continue Farxiga 10 mg daily - Continue spironolactone 12.5 mg - Continue Entresto 49-51 mg.   - Continue carvedilol, will decrease dose to 625 mg twice daily given low heart rate - Continue tafamidis for cardiac amyloidosis as above - Check BMET - Update echocardiogram prior to next clinic visit   Nonobstructive CAD: CT 01/2019 showed calcium score 0, but noncalcified plaque causing ~50% RCA stenosis (CTFFR 0.88, suggesting lesion is not functionally significant).  Denies any anginal symptoms - Atorvastatin 40 mg daily - Stopped aspirin given started Eliquis as above   Hypertension: Appears controlled -Continue Entresto and carvedilol   HLD: LDL 52 on 04/22/2021, on atorvastatin 40 mg daily.  LDL 04/26/2022 was 109, patient reported he had not been taking atorvastatin consistently.  Restarted atorvastatin 40 mg daily, LDL 46 on 11/07/2022   Thoracic aortic dilatation noted to have a 4.2cm dilation of the ascending aorta.  Repeat CTA chest on 03/09/2020 showed 3.9 cm ascending aorta.  Measured 3.7 cm on echocardiogram 05/2022  Pulmonary embolism/left lower extremity DVT: Diagnosed during hospitalization on 01/21/2019.  Started on Eliquis 5 mg twice daily.  PE thought to be provoked, due to sedentary lifestyle (working long hours as Airline pilot, sitting at desk all day).   Completed 60-month course of anticoagulation and was discontinued -Restarted Eliquis given new atrial flutter as above  Pulmonary nodule: 1 cm rounded nodular opacity involving the right lower lobe noted on CT chest 01/21/2019, which could represent atelectasis, infiltrate, or pulmonary nodule.  Follow-up CT on 05/14/2019 showed resolution of nodule  RTC in 3 months   Medication Adjustments/Labs and Tests Ordered: Current medicines are reviewed at length with the patient today.  Concerns regarding medicines are outlined above.  Orders Placed This Encounter  Procedures   Basic Metabolic Panel (BMET)   Magnesium   Amb Referral to AFIB Clinic   EKG 12-Lead   ECHOCARDIOGRAM COMPLETE     No orders of the  defined types were placed in this encounter.    Patient Instructions  Medication Instructions:  Continue all current medications *If you need a refill on your cardiac medications before your next appointment, please call your pharmacy*   Lab Work: Bmet, MG If you have labs (blood work) drawn today and your tests are completely normal, you will receive your results only by: MyChart Message (if you have MyChart) OR A paper copy in the mail If you have any lab test that is abnormal or we need to change your treatment, we will call you to review the results.   Testing/Procedures: ECHO please schedule before next visit Your physician has requested that you have an echocardiogram. Echocardiography is a painless test that uses sound waves to create images of your heart. It provides your doctor with information about the size and shape of your heart and how well your heart's chambers and valves are working. This procedure takes approximately one hour. There are no restrictions for this procedure. Please do NOT wear cologne, perfume, aftershave, or lotions (deodorant is allowed). Please arrive 15 minutes prior to your appointment time.  Please note: We ask at that you not bring children  with you during ultrasound (echo/ vascular) testing. Due to room size and safety concerns, children are not allowed in the ultrasound rooms during exams. Our front office staff cannot provide observation of children in our lobby area while testing is being conducted. An adult accompanying a patient to their appointment will only be allowed in the ultrasound room at the discretion of the ultrasound technician under special circumstances. We apologize for any inconvenience.    Follow-Up: At [facility], you and your health needs are our priority.  As part of our continuing mission to provide you with exceptional heart care, we have created designated Provider Care Teams.  These Care Teams include your primary Cardiologist (physician) and Advanced Practice Providers (APPs -  Physician Assistants and Nurse Practitioners) who all work together to provide you with the care you need, when you need it.  We recommend signing up for the patient portal called "MyChart".  Sign up information is provided on this After Visit Summary.  MyChart is used to connect with patients for Virtual Visits (Telemedicine).  Patients are able to view lab/test results, encounter notes, upcoming appointments, etc.  Non-urgent messages can be sent to your provider as well.   To learn more about what you can do with MyChart, go to [email or web address].    Your next appointment:   3 month(s). March 17 @ 1:40  Provider:   Little Ishikawa, MD     Other Instructions Referral to A fib clinic    Signed, Little Ishikawa, MD  04/25/2023 5:55 PM    [facility]

## 2023-04-25 ENCOUNTER — Other Ambulatory Visit: Payer: Self-pay

## 2023-04-25 ENCOUNTER — Ambulatory Visit: Payer: PPO | Attending: Cardiology | Admitting: Cardiology

## 2023-04-25 ENCOUNTER — Encounter: Payer: Self-pay | Admitting: Cardiology

## 2023-04-25 VITALS — BP 110/78 | HR 73 | Ht 72.0 in | Wt 232.2 lb

## 2023-04-25 DIAGNOSIS — I5042 Chronic combined systolic (congestive) and diastolic (congestive) heart failure: Secondary | ICD-10-CM | POA: Diagnosis not present

## 2023-04-25 DIAGNOSIS — I10 Essential (primary) hypertension: Secondary | ICD-10-CM

## 2023-04-25 DIAGNOSIS — I4892 Unspecified atrial flutter: Secondary | ICD-10-CM

## 2023-04-25 DIAGNOSIS — E854 Organ-limited amyloidosis: Secondary | ICD-10-CM

## 2023-04-25 DIAGNOSIS — E785 Hyperlipidemia, unspecified: Secondary | ICD-10-CM

## 2023-04-25 DIAGNOSIS — I43 Cardiomyopathy in diseases classified elsewhere: Secondary | ICD-10-CM | POA: Diagnosis not present

## 2023-04-25 NOTE — Progress Notes (Signed)
Specialty Pharmacy Refill Coordination Note  Andrew Vaughn is a 67 y.o. male contacted today regarding refills of specialty medication(s) Tafamidis   Patient requested Delivery   Delivery date: 04/26/23   Verified address: [address]   [name] [phone]   Medication will be filled on 04/25/23.

## 2023-04-25 NOTE — Patient Instructions (Addendum)
Medication Instructions:  Continue all current medications *If you need a refill on your cardiac medications before your next appointment, please call your pharmacy*   Lab Work: Bmet, MG If you have labs (blood work) drawn today and your tests are completely normal, you will receive your results only by: MyChart Message (if you have MyChart) OR A paper copy in the mail If you have any lab test that is abnormal or we need to change your treatment, we will call you to review the results.   Testing/Procedures: ECHO please schedule before next visit Your physician has requested that you have an echocardiogram. Echocardiography is a painless test that uses sound waves to create images of your heart. It provides your doctor with information about the size and shape of your heart and how well your heart's chambers and valves are working. This procedure takes approximately one hour. There are no restrictions for this procedure. Please do NOT wear cologne, perfume, aftershave, or lotions (deodorant is allowed). Please arrive 15 minutes prior to your appointment time.  Please note: We ask at that you not bring children with you during ultrasound (echo/ vascular) testing. Due to room size and safety concerns, children are not allowed in the ultrasound rooms during exams. Our front office staff cannot provide observation of children in our lobby area while testing is being conducted. An adult accompanying a patient to their appointment will only be allowed in the ultrasound room at the discretion of the ultrasound technician under special circumstances. We apologize for any inconvenience.    Follow-Up: At [facility], you and your health needs are our priority.  As part of our continuing mission to provide you with exceptional heart care, we have created designated Provider Care Teams.  These Care Teams include your primary Cardiologist (physician) and Advanced Practice Providers (APPs -   Physician Assistants and Nurse Practitioners) who all work together to provide you with the care you need, when you need it.  We recommend signing up for the patient portal called "MyChart".  Sign up information is provided on this After Visit Summary.  MyChart is used to connect with patients for Virtual Visits (Telemedicine).  Patients are able to view lab/test results, encounter notes, upcoming appointments, etc.  Non-urgent messages can be sent to your provider as well.   To learn more about what you can do with MyChart, go to [email or web address].    Your next appointment:   3 month(s). March 17 @ 1:40  Provider:   Little Ishikawa, MD     Other Instructions Referral to A fib clinic

## 2023-04-26 LAB — BASIC METABOLIC PANEL
BUN/Creatinine Ratio: 16 (ref 10–24)
BUN: 21 mg/dL (ref 8–27)
CO2: 24 mmol/L (ref 20–29)
Calcium: 10 mg/dL (ref 8.6–10.2)
Chloride: 100 mmol/L (ref 96–106)
Creatinine, Ser: 1.34 mg/dL — ABNORMAL HIGH (ref 0.76–1.27)
Glucose: 85 mg/dL (ref 70–99)
Potassium: 4.7 mmol/L (ref 3.5–5.2)
Sodium: 141 mmol/L (ref 134–144)
eGFR: 58 mL/min/{1.73_m2} — ABNORMAL LOW (ref >59)

## 2023-04-26 LAB — MAGNESIUM: Magnesium: 2.3 mg/dL (ref 1.6–2.3)

## 2023-05-11 ENCOUNTER — Ambulatory Visit (HOSPITAL_COMMUNITY)
Admission: RE | Admit: 2023-05-11 | Discharge: 2023-05-11 | Disposition: A | Discharge status: Home / Self Care | Payer: PPO | Source: Ambulatory Visit | Attending: Internal Medicine | Admitting: Internal Medicine

## 2023-05-11 ENCOUNTER — Other Ambulatory Visit: Payer: Self-pay

## 2023-05-11 VITALS — BP 100/66 | HR 68 | Ht 72.0 in | Wt 230.0 lb

## 2023-05-11 DIAGNOSIS — Z7901 Long term (current) use of anticoagulants: Secondary | ICD-10-CM | POA: Insufficient documentation

## 2023-05-11 DIAGNOSIS — I4891 Unspecified atrial fibrillation: Secondary | ICD-10-CM

## 2023-05-11 DIAGNOSIS — I4892 Unspecified atrial flutter: Secondary | ICD-10-CM | POA: Insufficient documentation

## 2023-05-11 DIAGNOSIS — Z79899 Other long term (current) drug therapy: Secondary | ICD-10-CM | POA: Insufficient documentation

## 2023-05-11 DIAGNOSIS — D6869 Other thrombophilia: Secondary | ICD-10-CM | POA: Insufficient documentation

## 2023-05-11 DIAGNOSIS — I4819 Other persistent atrial fibrillation: Secondary | ICD-10-CM | POA: Diagnosis not present

## 2023-05-11 LAB — CBC
HCT: 48.8 % (ref 39.0–52.0)
Hemoglobin: 16 g/dL (ref 13.0–17.0)
MCH: 29.3 pg (ref 26.0–34.0)
MCHC: 32.8 g/dL (ref 30.0–36.0)
MCV: 89.2 fL (ref 80.0–100.0)
Platelets: 195 10*3/uL (ref 150–400)
RBC: 5.47 MIL/uL (ref 4.22–5.81)
RDW: 12.8 % (ref 11.5–15.5)
WBC: 8.1 10*3/uL (ref 4.0–10.5)
nRBC: 0 % (ref 0.0–0.2)

## 2023-05-11 LAB — BASIC METABOLIC PANEL
Anion gap: 7 (ref 5–15)
BUN: 20 mg/dL (ref 8–23)
CO2: 23 mmol/L (ref 22–32)
Calcium: 9.5 mg/dL (ref 8.9–10.3)
Chloride: 107 mmol/L (ref 98–111)
Creatinine, Ser: 1.25 mg/dL — ABNORMAL HIGH (ref 0.61–1.24)
GFR, Estimated: >60 mL/min (ref >60)
Glucose, Bld: 137 mg/dL — ABNORMAL HIGH (ref 70–99)
Potassium: 4.1 mmol/L (ref 3.5–5.1)
Sodium: 137 mmol/L (ref 135–145)

## 2023-05-11 NOTE — Progress Notes (Signed)
Primary Care Physician: Nelwyn Salisbury, MD Primary Cardiologist: Little Ishikawa, MD Electrophysiologist: None     Referring Physician: Dr. Kandis Fantasia is a 67 y.o. male with a history of nonobstructive CAD, HTN, HLD, thoracic aortic dilatation, PE/DVT, cardiac amyloidosis, chronic systolic/diastolic HF, atrial flutter, and persistent atrial fibrillation who presents for consultation in the [facility]. Seen by Dr. Bjorn Pippin on 04/25/23 noted to be in rate controlled Afib and suggested follow up to discuss rhythm control strategy. Patient is on Eliquis for a CHADS2VASC score of 4.  On evaluation today, he is currently in atrial flutter. He does not have cardiac awareness. He missed a dose of Eliquis last week.  Amio?  Today, he denies symptoms of palpitations, chest pain, shortness of breath, orthopnea, PND, lower extremity edema, dizziness, presyncope, syncope, snoring, daytime somnolence, bleeding, or neurologic sequela. The patient is tolerating medications without difficulties and is otherwise without complaint today.   he has a BMI of Body mass index is 31.19 kg/m.Marland Kitchen Filed Weights   05/11/23 0908  Weight: 104.3 kg    Current Outpatient Medications  Medication Sig Dispense Refill   apixaban (ELIQUIS) 5 MG TABS tablet Take 1 tablet (5 mg total) by mouth 2 (two) times daily. 180 tablet 3   atorvastatin (LIPITOR) 40 MG tablet TAKE 1 TABLET(40 MG) BY MOUTH DAILY AT 6 PM 90 tablet 2   brimonidine (ALPHAGAN P) 0.1 % SOLN Place 1 drop into both eyes at bedtime.     carvedilol (COREG) 6.25 MG tablet Take 1 tablet (6.25 mg total) by mouth 2 (two) times daily. 180 tablet 3   dapagliflozin propanediol (FARXIGA) 10 MG TABS tablet Take 1 tablet (10 mg total) by mouth daily before breakfast. 90 tablet 3   ENTRESTO 49-51 MG TAKE 1 TABLET BY MOUTH TWICE DAILY 180 tablet 2   furosemide (LASIX) 40 MG tablet TAKE 1 TABLET BY MOUTH DAILY AS  NEEDED(WEIGHT INCREASE OF 3LBS OVERNIGHT OR 5LBS IN ONE WEEK) 30 tablet 5   spironolactone (ALDACTONE) 25 MG tablet TAKE 1/2 TABLET(12.5 MG) BY MOUTH DAILY 45 tablet 3   Tafamidis (VYNDAMAX) 61 MG CAPS Take 1 capsule (61 mg total) by mouth daily. 30 capsule 12   No current facility-administered medications for this encounter.    Atrial Fibrillation Management history:  Previous antiarrhythmic drugs: none Previous cardioversions: 11/2022 Previous ablations: none Anticoagulation history: Eliquis   ROS- All systems are reviewed and negative except as per the HPI above.  Physical Exam: BP 100/66   Pulse 68   Ht 6' (1.829 m)   Wt 104.3 kg   BMI 31.19 kg/m   GEN: Well nourished, well developed in no acute distress NECK: No JVD; No carotid bruits CARDIAC: Irregularly irregular rate and rhythm, no murmurs, rubs, gallops RESPIRATORY:  Clear to auscultation without rales, wheezing or rhonchi  ABDOMEN: Soft, non-tender, non-distended EXTREMITIES:  No edema; No deformity   EKG today demonstrates  Vent. rate 68 BPM PR interval * ms QRS duration 102 ms QT/QTcB 400/425 ms P-R-T axes * 116 -67 Atrial flutter with variable A-V block Septal infarct , age undetermined Lateral infarct , age undetermined T wave abnormality, consider inferior ischemia Abnormal ECG When compared with ECG of 25-Apr-2023 12:33, PREVIOUS ECG IS PRESENT  Echo 05/25/22 demonstrated  1. Left ventricular ejection fraction, by estimation, is 55 to 60%. The  left ventricle has normal function. The left ventricle has no regional  wall motion abnormalities.  There is severe concentric left ventricular  hypertrophy. Left ventricular diastolic   parameters are indeterminate. The average left ventricular global  longitudinal strain is -23.1 %. The global longitudinal strain is normal.   2. Right ventricular systolic function is normal. The right ventricular  size is normal. There is normal pulmonary artery systolic  pressure.   3. Left atrial size was severely dilated.   4. Right atrial size was severely dilated.   5. The mitral valve is normal in structure. Mild mitral valve  regurgitation. No evidence of mitral stenosis.   6. The aortic valve is tricuspid. Aortic valve regurgitation is not  visualized. No aortic stenosis is present.   7. The inferior vena cava is normal in size with greater than 50%  respiratory variability, suggesting right atrial pressure of 3 mmHg.    ASSESSMENT & PLAN CHA2DS2-VASc Score = 4  The patient's score is based upon: CHF History: 1 HTN History: 1 Diabetes History: 0 Stroke History: 0 Vascular Disease History: 1 Age Score: 1 Gender Score: 0       ASSESSMENT AND PLAN: Persistent Atrial Flutter The patient's CHA2DS2-VASc score is 4, indicating a 4.8% annual risk of stroke.    He is currently in atrial flutter. We discussed options for treatment which include cardioversion and also rhythm control strategies involving AAD or ablation. After discussion, patient agrees to proceed with cardioversion and would like to speak with EP regarding ablation. It is noted that in SR he is bradycardic on prior ECGs; he is on carvedilol for his HF regimen. It may be difficult to begin AAD therapy given low HR at baseline but this can be discussed with EP as well. Labs drawn today. Cardioversion will have to be scheduled 3 weeks from 12/16 due to recent missed dose of Eliquis.   Informed Consent   Shared Decision Making/Informed Consent The risks (stroke, cardiac arrhythmias rarely resulting in the need for a temporary or permanent pacemaker, skin irritation or burns and complications associated with conscious sedation including aspiration, arrhythmia, respiratory failure and death), benefits (restoration of normal sinus rhythm) and alternatives of a direct current cardioversion were explained in detail to Mr. Stanger and he agrees to proceed.        Secondary Hypercoagulable  State (ICD10:  D68.69) The patient is at significant risk for stroke/thromboembolism based upon his CHA2DS2-VASc Score of 4.  Continue Apixaban (Eliquis).  Emphasis placed on compliance and not missing any doses.    Will refer to EP to discuss possible ablation.    Lake Bells, PA-C  [facility] [address] [place], [place] [address] [phone]

## 2023-05-11 NOTE — Patient Instructions (Addendum)
Cardioversion scheduled for: Monday, January 6th   - Arrive at the [facility] and go to admitting at 830am   - Do not eat or drink anything after midnight the night prior to your procedure.   - Take all your morning medication (except diabetic medications) with a sip of water prior to arrival.  - You will not be able to drive home after your procedure.    - Do NOT miss any doses of your blood thinner - if you should miss a dose please notify our office immediately.   - If you feel as if you go back into normal rhythm prior to scheduled cardioversion, please notify our office immediately.   If your procedure is canceled in the cardioversion suite you will be charged a cancellation fee.     Hold below medications 72 hours prior to scheduled procedure/anesthesia. Restart medication on the following day after scheduled procedure/anesthesia  Dapagliflozin Marcelline Deist)    For those patients who have a scheduled procedure/anesthesia on the same day of the week as their dose, hold the medication on the day of surgery.  They can take their scheduled dose the week before.  **Patients on the above medications scheduled for elective procedures that have not held the medication for the appropriate amount of time are at risk of cancellation or change in the anesthetic plan.

## 2023-05-12 ENCOUNTER — Other Ambulatory Visit (HOSPITAL_COMMUNITY): Payer: Self-pay | Admitting: *Deleted

## 2023-05-12 ENCOUNTER — Telehealth (HOSPITAL_COMMUNITY): Payer: Self-pay | Admitting: *Deleted

## 2023-05-12 NOTE — Telephone Encounter (Signed)
Pt realized missed PM dose of Eliquis on 12/17. Cardioversion rescheduled for 05/31/23. Arrival 830am. Reminder hold Farxiga 72 hours prior.

## 2023-05-19 ENCOUNTER — Other Ambulatory Visit: Payer: Self-pay

## 2023-05-19 NOTE — Progress Notes (Signed)
Specialty Pharmacy Refill Coordination Note  Andrew Vaughn is a 67 y.o. male contacted today regarding refills of specialty medication(s) Tafamidis [name])   Patient requested Delivery   Delivery date: 05/22/23   Verified address: [address]   [name] [phone]   Medication will be filled on 12.27.24.

## 2023-05-24 ENCOUNTER — Other Ambulatory Visit: Payer: Self-pay | Admitting: Cardiology

## 2023-05-30 NOTE — Progress Notes (Signed)
 Unable to reach patient about procedure, but was able to leave a detailed message. Stated that the patient needed to arrive at the hospital at 0815 , remain NPO after 0000, needs to have a ride home and a responsible adult to stay with them for 24 hours after the procedure. Instructed the patient to call back if they had any questions.

## 2023-05-31 ENCOUNTER — Ambulatory Visit (HOSPITAL_COMMUNITY): Payer: PPO | Admitting: Anesthesiology

## 2023-05-31 ENCOUNTER — Ambulatory Visit (HOSPITAL_COMMUNITY)
Admission: RE | Admit: 2023-05-31 | Discharge: 2023-05-31 | Disposition: A | Discharge status: Home / Self Care | Payer: PPO | Attending: Cardiology | Admitting: Cardiology

## 2023-05-31 ENCOUNTER — Encounter (HOSPITAL_COMMUNITY)
Admission: RE | Disposition: A | Discharge status: Home / Self Care | Payer: Self-pay | Source: Home / Self Care | Attending: Cardiology

## 2023-05-31 ENCOUNTER — Encounter (HOSPITAL_COMMUNITY): Payer: Self-pay | Admitting: Cardiology

## 2023-05-31 DIAGNOSIS — I110 Hypertensive heart disease with heart failure: Secondary | ICD-10-CM

## 2023-05-31 DIAGNOSIS — I4892 Unspecified atrial flutter: Secondary | ICD-10-CM | POA: Diagnosis not present

## 2023-05-31 DIAGNOSIS — I4819 Other persistent atrial fibrillation: Secondary | ICD-10-CM | POA: Diagnosis not present

## 2023-05-31 DIAGNOSIS — I499 Cardiac arrhythmia, unspecified: Secondary | ICD-10-CM | POA: Diagnosis not present

## 2023-05-31 DIAGNOSIS — I509 Heart failure, unspecified: Secondary | ICD-10-CM

## 2023-05-31 DIAGNOSIS — I2510 Atherosclerotic heart disease of native coronary artery without angina pectoris: Secondary | ICD-10-CM | POA: Diagnosis not present

## 2023-05-31 DIAGNOSIS — Z86718 Personal history of other venous thrombosis and embolism: Secondary | ICD-10-CM | POA: Diagnosis not present

## 2023-05-31 DIAGNOSIS — Z7901 Long term (current) use of anticoagulants: Secondary | ICD-10-CM | POA: Diagnosis not present

## 2023-05-31 DIAGNOSIS — I4891 Unspecified atrial fibrillation: Secondary | ICD-10-CM | POA: Diagnosis not present

## 2023-05-31 DIAGNOSIS — I5042 Chronic combined systolic (congestive) and diastolic (congestive) heart failure: Secondary | ICD-10-CM | POA: Diagnosis not present

## 2023-05-31 DIAGNOSIS — D6869 Other thrombophilia: Secondary | ICD-10-CM | POA: Diagnosis not present

## 2023-05-31 DIAGNOSIS — E785 Hyperlipidemia, unspecified: Secondary | ICD-10-CM | POA: Diagnosis not present

## 2023-05-31 HISTORY — PX: CARDIOVERSION: EP1203

## 2023-05-31 SURGERY — CARDIOVERSION (CATH LAB)
Anesthesia: General

## 2023-05-31 MED ORDER — LIDOCAINE 2% (20 MG/ML) 5 ML SYRINGE
INTRAMUSCULAR | Status: DC | PRN
  Administered 2023-05-31: 60 mg via INTRAVENOUS

## 2023-05-31 MED ORDER — PROPOFOL 10 MG/ML IV BOLUS
INTRAVENOUS | Status: DC | PRN
  Administered 2023-05-31: 60 mg via INTRAVENOUS

## 2023-05-31 SURGICAL SUPPLY — 1 items: PAD DEFIB RADIO PHYSIO CONN (PAD) ×1 IMPLANT

## 2023-05-31 NOTE — Interval H&P Note (Signed)
 History and Physical Interval Note:  05/31/2023 9:23 AM  Andrew Vaughn  has presented today for surgery, with the diagnosis of AFIB.  The various methods of treatment have been discussed with the patient and family. After consideration of risks, benefits and other options for treatment, the patient has consented to  Procedure(s): CARDIOVERSION (N/A) as a surgical intervention.  The patient's history has been reviewed, patient examined, no change in status, stable for surgery.  I have reviewed the patient's chart and labs.  Questions were answered to the patient's satisfaction.     Coran Dipaola Lonni

## 2023-05-31 NOTE — Anesthesia Preprocedure Evaluation (Signed)
 Anesthesia Evaluation  Patient identified by MRN, date of birth, ID band Patient awake    Reviewed: Allergy & Precautions, H&P , NPO status , Patient's Chart, lab work & pertinent test results  Airway Mallampati: III  TM Distance: >3 FB Neck ROM: Full    Dental no notable dental hx. (+) Teeth Intact, Dental Advisory Given   Pulmonary neg pulmonary ROS   Pulmonary exam normal breath sounds clear to auscultation       Cardiovascular hypertension, Pt. on medications and Pt. on home beta blockers + dysrhythmias Atrial Fibrillation  Rhythm:Irregular Rate:Normal     Neuro/Psych negative neurological ROS  negative psych ROS   GI/Hepatic negative GI ROS, Neg liver ROS,,,  Endo/Other  negative endocrine ROS    Renal/GU negative Renal ROS  negative genitourinary   Musculoskeletal   Abdominal   Peds  Hematology negative hematology ROS (+)   Anesthesia Other Findings   Reproductive/Obstetrics negative OB ROS                             Anesthesia Physical Anesthesia Plan  ASA: 3  Anesthesia Plan: General   Post-op Pain Management: Minimal or no pain anticipated   Induction: Intravenous  PONV Risk Score and Plan: 2 and Propofol  infusion  Airway Management Planned: Natural Airway and Simple Face Mask  Additional Equipment:   Intra-op Plan:   Post-operative Plan:   Informed Consent: I have reviewed the patients History and Physical, chart, labs and discussed the procedure including the risks, benefits and alternatives for the proposed anesthesia with the patient or authorized representative who has indicated his/her understanding and acceptance.     Dental advisory given  Plan Discussed with: CRNA  Anesthesia Plan Comments:        Anesthesia Quick Evaluation

## 2023-05-31 NOTE — CV Procedure (Signed)
 Procedure:   DCCV  Indication:  Symptomatic atrial fibrillation/atrial flutter  Procedure Note:  The patient signed informed consent.  They have had had therapeutic anticoagulation with apixaban  greater than 3 weeks.  Anesthesia was administered by Dr. Epifanio.  Patient received 60 mg IV lidocaine  and 60 mg IV propofol .Adequate airway was maintained throughout and vital followed per protocol.  They were cardioverted x 1 with 200J of biphasic synchronized energy.  They converted to NSR.  There were no apparent complications.  The patient had normal neuro status and respiratory status post procedure with vitals stable as recorded elsewhere.    Follow up:  They will continue on current medical therapy and follow up with cardiology as scheduled.  Shelda Bruckner, MD PhD 05/31/2023 9:34 AM

## 2023-05-31 NOTE — Anesthesia Postprocedure Evaluation (Signed)
 Anesthesia Post Note  Patient: Andrew Vaughn  Procedure(s) Performed: CARDIOVERSION     Patient location during evaluation: Cath Lab Anesthesia Type: General Level of consciousness: awake and alert Pain management: pain level controlled Vital Signs Assessment: post-procedure vital signs reviewed and stable Respiratory status: spontaneous breathing, nonlabored ventilation and respiratory function stable Cardiovascular status: blood pressure returned to baseline and stable Postop Assessment: no apparent nausea or vomiting Anesthetic complications: no  No notable events documented.  Last Vitals:  Vitals:   05/31/23 0945 05/31/23 0950  BP: 98/87 105/79  Pulse: (!) 57 (!) 52  Resp: 13 11  Temp:    SpO2: 97% 94%    Last Pain:  Vitals:   05/31/23 0950  TempSrc:   PainSc: 0-No pain                 Larkin Alfred,W. EDMOND

## 2023-05-31 NOTE — Transfer of Care (Signed)
 Immediate Anesthesia Transfer of Care Note  Patient: Andrew Vaughn  Procedure(s) Performed: CARDIOVERSION  Patient Location: PACU and Cath Lab  Anesthesia Type:General  Level of Consciousness: drowsy and patient cooperative  Airway & Oxygen Therapy: Patient Spontanous Breathing and Patient connected to nasal cannula oxygen  Post-op Assessment: Report given to RN and Post -op Vital signs reviewed and stable  Post vital signs: Reviewed and stable  Last Vitals:  Vitals Value Taken Time  BP 114/74   Temp    Pulse 61 05/31/23 0920  Resp 16 05/31/23 0920  SpO2 97 % 05/31/23 0920  Vitals shown include unfiled device data.  Last Pain:  Vitals:   05/31/23 0854  TempSrc: Temporal         Complications: No notable events documented.

## 2023-06-01 ENCOUNTER — Encounter (HOSPITAL_COMMUNITY): Payer: Self-pay | Admitting: Cardiology

## 2023-06-02 ENCOUNTER — Ambulatory Visit (HOSPITAL_COMMUNITY): Payer: PPO | Attending: Cardiology

## 2023-06-02 DIAGNOSIS — I43 Cardiomyopathy in diseases classified elsewhere: Secondary | ICD-10-CM

## 2023-06-02 DIAGNOSIS — E854 Organ-limited amyloidosis: Secondary | ICD-10-CM | POA: Diagnosis not present

## 2023-06-02 LAB — ECHOCARDIOGRAM COMPLETE: S' Lateral: 2.9 cm

## 2023-06-09 ENCOUNTER — Encounter: Payer: Self-pay | Admitting: *Deleted

## 2023-06-14 ENCOUNTER — Other Ambulatory Visit: Payer: Self-pay

## 2023-06-16 ENCOUNTER — Other Ambulatory Visit: Payer: Self-pay

## 2023-06-19 ENCOUNTER — Other Ambulatory Visit (HOSPITAL_COMMUNITY): Payer: Self-pay

## 2023-06-27 ENCOUNTER — Other Ambulatory Visit: Payer: Self-pay

## 2023-06-27 NOTE — Progress Notes (Signed)
 Specialty Pharmacy Refill Coordination Note  Andrew Vaughn is a 68 y.o. male contacted today regarding refills of specialty medication(s) Tafamidis  (Vyndamax )   Patient requested Delivery   Delivery date: 06/28/23   Verified address: [address]   [place] [phone]   Medication will be filled on 06/27/23.

## 2023-07-06 ENCOUNTER — Other Ambulatory Visit: Payer: Self-pay | Admitting: Cardiology

## 2023-07-17 ENCOUNTER — Other Ambulatory Visit (HOSPITAL_COMMUNITY): Payer: Self-pay

## 2023-07-21 ENCOUNTER — Other Ambulatory Visit: Payer: Self-pay

## 2023-08-01 ENCOUNTER — Other Ambulatory Visit: Payer: Self-pay

## 2023-08-01 ENCOUNTER — Other Ambulatory Visit (HOSPITAL_COMMUNITY): Payer: Self-pay

## 2023-08-01 ENCOUNTER — Encounter (HOSPITAL_COMMUNITY): Payer: Self-pay

## 2023-08-01 NOTE — Progress Notes (Signed)
 Specialty Pharmacy Refill Coordination Note  Andrew Vaughn is a 68 y.o. male contacted today regarding refills of specialty medication(s) Tafamidis [name])   Patient requested Delivery   Delivery date: 08/02/23   Verified address: [address]   [name] [phone]   Medication will be filled on 03.11.25.

## 2023-08-01 NOTE — Progress Notes (Signed)
 Specialty Pharmacy Ongoing Clinical Assessment Note  Andrew Vaughn is a 68 y.o. male who is being followed by the specialty pharmacy service for RxSp Cardiology   Patient's specialty medication(s) reviewed today: Tafamidis (Vyndamax)   Missed doses in the last 4 weeks: 0   Patient/Caregiver did not have any additional questions or concerns.   Therapeutic benefit summary: Patient is achieving benefit   Adverse events/side effects summary: No adverse events/side effects   Patient's therapy is appropriate to: Continue    Goals Addressed             This Visit's Progress    Stabilization of disease       Patient is on track. Patient will maintain adherence.  Patient reports that he remains stable at this time.          Follow up:  6 months  Servando Snare Specialty Pharmacist

## 2023-08-06 NOTE — Progress Notes (Unsigned)
 Cardiology Office Note:    Date:  08/09/2023   ID:  Andrew Vaughn, DOB 1956/01/01, MRN [id number]  PCP:  Andrew Salisbury, MD  Cardiologist:  Little Ishikawa, MD  Electrophysiologist:  None   Referring MD: Andrew Salisbury, MD    Chief complaint: heart failure  History of Present Illness:    Andrew Vaughn is a 68 y.o. male with a hx of cardiac amyloidosis, chronic systolic heart failure, hypertension and glaucoma who presents for follow-up.  He was admitted to [facility] from 01/20/2022 to 01/24/2019.  He had presented with shortness of breath for the few months prior to admission.  In addition, he has been having intermittent fevers throughout the month of July, for which he completed a 9-day course of doxycycline.  Fevers resolved but he continued to have shortness of breath prompting his ED visit.  TTE on 01/22/2020 showed EF 40 to 45%, mild LVH, grade 3 diastolic dysfunction.  He was also found to have a PE.  Coronary CT showed nonobstructive CAD.  He was started on Entresto and carvedilol.  Repeat TTE on 05/01/2019 showed normalization of LV function, but severe LVH and grade 3 diastolic dysfunction.  PYP scan consistent with cardiac amyloidosis.  Echocardiogram on 05/12/2021 showed EF 45 to 50%, severe LVH, normal RV function, severe left atrial dilatation, moderate right atrial dilatation, no significant valvular disease.  Echocardiogram 05/25/2022 showed EF 55 to 60%, normal RV function, severe biatrial enlargement, mild mitral regurgitation.  Was found to be in atrial fibrillation and underwent successful cardioversion 12/06/2022.  Echocardiogram 06/02/2023 showed EF 55 to 60%, mild RV dysfunction, severe biatrial enlargement.  Had recurrent A-fib and underwent repeat cardioversion 05/31/2023.  Since last clinic visit, he reports he is doing well.  Denies any chest pain, dyspnea, lower extremity edema, or palpitations.  Reports some lightheadedness but denies any syncope.  Reports taking Lasix  every other day.  Denies any bleeding on Eliquis.  Wt Readings from Last 3 Encounters:  08/07/23 232 lb 3.2 oz (105.3 kg)  05/31/23 226 lb (102.5 kg)  05/11/23 230 lb (104.3 kg)     Past Medical History:  Diagnosis Date   Glaucoma    per Dr. Charlotte Sanes   Hypertension     Past Surgical History:  Procedure Laterality Date   CARDIOVERSION N/A 12/06/2022   Procedure: CARDIOVERSION;  Surgeon: Thurmon Fair, MD;  Location: [facility] INVASIVE CV LAB;  Service: Cardiovascular;  Laterality: N/A;   CARDIOVERSION N/A 05/31/2023   Procedure: CARDIOVERSION;  Surgeon: Jodelle Red, MD;  Location: [facility] INVASIVE CV LAB;  Service: Cardiovascular;  Laterality: N/A;   dislocated left shoulder  1993   right knee giant cell bone tumor  1998   [facility] Dr. Elesa Massed at [facility]   ruptured lumbar disc  1976   torn meniscus left knee  8/08   Dr. Madelon Lips    Current Medications: Current Meds  Medication Sig   apixaban (ELIQUIS) 5 MG TABS tablet Take 1 tablet (5 mg total) by mouth 2 (two) times daily.   atorvastatin (LIPITOR) 40 MG tablet TAKE 1 TABLET(40 MG) BY MOUTH DAILY AT 6 PM   brimonidine (ALPHAGAN P) 0.1 % SOLN Place 1 drop into both eyes 2 (two) times daily.   carvedilol (COREG) 6.25 MG tablet Take 1 tablet (6.25 mg total) by mouth 2 (two) times daily.   dapagliflozin propanediol (FARXIGA) 10 MG TABS tablet Take 1 tablet (10 mg total) by mouth daily before breakfast.   ENTRESTO 49-51 MG  TAKE 1 TABLET BY MOUTH TWICE DAILY   furosemide (LASIX) 40 MG tablet TAKE 1 TABLET BY MOUTH DAILY AS NEEDED(WEIGHT INCREASE OF 3LBS OVERNIGHT OR 5LBS IN ONE WEEK)   Multiple Vitamins-Minerals (MULTIVITAMIN WITH MINERALS) tablet Take 1 tablet by mouth daily.   spironolactone (ALDACTONE) 25 MG tablet TAKE 1/2 TABLET(12.5 MG) BY MOUTH DAILY   Tafamidis (VYNDAMAX) 61 MG CAPS Take 1 capsule (61 mg total) by mouth daily.     Allergies:   Shrimp [shellfish allergy], Erythromycin, Ivp dye [iodinated contrast media], and  Penicillins   Social History   Socioeconomic History   Marital status: Married    Spouse name: Not on file   Number of children: 3   Years of education: Not on file   Highest education level: Not on file  Occupational History   Not on file  Tobacco Use   Smoking status: Never   Smokeless tobacco: Never  Vaping Use   Vaping status: Never Used  Substance and Sexual Activity   Alcohol use: Yes    Alcohol/week: 0.0 standard drinks of alcohol    Comment: occ   Drug use: No   Sexual activity: Not on file  Other Topics Concern   Not on file  Social History Narrative   Not on file   Social Drivers of Health   Financial Resource Strain: Not on file  Food Insecurity: Not on file  Transportation Needs: Not on file  Physical Activity: Not on file  Stress: Not on file  Social Connections: Not on file     Family History: The patient's family history includes Atrial fibrillation in his father; CAD in his father; Dementia in his mother; Glaucoma in an other family member; Heart disease in an other family member; Heart failure in his father; Hypertension in an other family member; Hypothyroidism in his sister; Stroke in an other family member.  ROS:   Please see the history of present illness.     All other systems reviewed and are negative.  EKGs/Labs/Other Studies Reviewed:    The following studies were reviewed today:   EKG:   08/07/2023: Sinus bradycardia, PACs, PVC, first-degree AV block 04/25/23: Atrial fibrillation, rate 62 01/11/23: Sinus bradycardia, first-degree AV block, rate 49 12/05/2022: Atrial flutter with variable conduction, rate 60 11/02/2021: Sinus rhythm, first-degree AV block, rate 60, right axis deviation, Q waves in V1-3, I/aVL, T wave inversions in inferior leads 04/22/21: Sinus rhythm, first-degree AV block, rate 67, right axis deviation, Q waves in V1-4, I, aVL, T wave inversions in leads III, aVF  CTA chest 01/21/19: 1. Very tiny nonocclusive pulmonary  embolus involving the right lower lobe as detailed above. 2. Overall findings most concerning for congestive heart failure including moderate-sized bilateral pleural effusions. 3. Mediastinal adenopathy, presumably reactive. 4. There is a 1 cm rounded nodular opacity involving the right lower lobe as detailed above. This may represent a focus of atelectasis, infiltrate, or pulmonary nodule. A three-month follow-up CT of the chest is recommended to confirm resolution of this finding. 5. Dilated ascending aorta measuring 4.2 cm. Recommend annual imaging followup by CTA or MRA. This recommendation follows 2010 ACCF/AHA/AATS/ACR/ASA/SCA/SCAI/SIR/STS/SVM Guidelines for the Diagnosis and Management of Patients with Thoracic Aortic Disease. Circulation. 2010; 121: Z610-R604. Aortic aneurysm NOS (ICD10-I71.9)  TTE 01/22/19:  1. The left ventricle has mild-moderately reduced systolic function, with an ejection fraction of 40-45%. The cavity size was normal. There is mild concentric left ventricular hypertrophy. Left ventricular diastolic Doppler parameters are consistent  with restrictive filling.  Left ventrical global hypokinesis without regional wall motion abnormalities.  2. The right ventricle has mildly reduced systolic function. The cavity was mildly enlarged. There is no increase in right ventricular wall thickness. Right ventricular systolic pressure is mildly elevated with an estimated pressure of 41.0 mmHg.  3. Left atrial size was mildly dilated.  4. Right atrial size was mildly dilated.  5. Mitral valve regurgitation is mild to moderate by color flow Doppler. The MR jet is centrally-directed.  6. The aorta is normal unless otherwise noted.  7. The inferior vena cava was dilated in size with <50% respiratory variability.  Coronary CTA 01/23/19: 1. Coronary calcium score of 0. This was 0 percentile for age and sex matched control. 2. Normal coronary origin with right dominance. 3.  Nonobstructive CAD. There is noncalcified plaque in the proximal RCA causing ~50% stenosis. CTFFR across this lesion is 0.88, suggesting lesion is not functionally significant. CAD-RADS 3. Moderate stenosis. Consider symptom-guided anti-ischemic pharmacotherapy as well as risk factor modification per guideline directed care. Additional analysis with CT FFR will be submitted.   TTE 05/01/19:  1. In the setting of severe concentric left ventricular hypertrophy, pattern of restrictive diastolic filling, and increased RV wall thickeness, cannot exclude a diagnosis of cardiac amyloidosis or other infiltrative process.  2. Left ventricular ejection fraction, by 3D calculation, is 57%. The left ventricle has normal function. There is severely increased left ventricular hypertrophy.  3. Left ventricular diastolic parameters are consistent with Grade III diastolic dysfunction (restrictive).  4. Global right ventricle has normal systolic function.The right ventricular size is normal. Moderately increased right ventricular wall thickness.  5. Left atrial size was moderately dilated.  6. Right atrial size was mildly dilated.  7. The mitral valve is grossly normal. No evidence of mitral valve regurgitation. Mild mitral stenosis.  8. The tricuspid valve is normal in structure. Tricuspid valve regurgitation is trivial.  9. The aortic valve is tricuspid. Aortic valve regurgitation is trivial. No evidence of aortic valve sclerosis or stenosis. 10. The pulmonic valve was normal in structure. Pulmonic valve regurgitation is trivial. 11. Normal pulmonary artery systolic pressure. 12. The inferior vena cava is normal in size with <50% respiratory variability, suggesting right atrial pressure of 8 mmHg. 13. The left ventricle has no regional wall motion abnormalities.  Recent Labs: 11/07/2022: TSH 0.926 11/15/2022: ALT 18 05/11/2023: Hemoglobin 16.0; Platelets 195 08/07/2023: BUN 19; Creatinine, Ser 1.15; Magnesium  2.3; Potassium 5.2; Sodium 138  Recent Lipid Panel    Component Value Date/Time   CHOL 94 (L) 11/07/2022 1146   TRIG 69 11/07/2022 1146   HDL 33 (L) 11/07/2022 1146   CHOLHDL 2.8 11/07/2022 1146   CHOLHDL 4.5 01/22/2019 0307   VLDL 9 01/22/2019 0307   LDLCALC 46 11/07/2022 1146    Physical Exam:    VS:  BP 112/78 (BP Location: Left Arm, Patient Position: Sitting, Cuff Size: Large)   Pulse (!) 59   Ht 6' (1.829 m)   Wt 232 lb 3.2 oz (105.3 kg)   SpO2 96%   BMI 31.49 kg/m     Wt Readings from Last 3 Encounters:  08/07/23 232 lb 3.2 oz (105.3 kg)  05/31/23 226 lb (102.5 kg)  05/11/23 230 lb (104.3 kg)     GEN: Well nourished, well developed in no acute distress HEENT: Normal NECK: No JVD CARDIAC: Regular, bradycardic, no murmurs, rubs, gallops RESPIRATORY:  Clear to auscultation without rales, wheezing or rhonchi  ABDOMEN: Soft, non-tender, non-distended MUSCULOSKELETAL:  Trace LE  edema SKIN: Warm and dry NEUROLOGIC:  Alert and oriented x 3 PSYCHIATRIC:  Normal affect   ASSESSMENT:    1. Atrial fibrillation, unspecified type (HCC)   2. Cardiac amyloidosis (HCC)   3. Essential hypertension   4. Chronic combined systolic and diastolic heart failure (HCC)   5. Coronary artery disease involving native coronary artery of native heart without angina pectoris   6. Medication management      PLAN:    Atrial fibrillation/flutter: New diagnosis, noted to be in atrial flutter in clinic on 11/07/2022.  Rates appear well-controlled.  CHA2DS2-VASc 4 (CHF, hypertension, age, PE).  Underwent successful cardioversion 12/06/2022.  Had recurrent A-fib and underwent repeat cardioversion 05/31/2023. -Continue Eliquis 5 mg twice daily -Continue carvedilol 6.25 mg twice daily  -Given recurrent atrial fibrillation status post cardioversion x 2, will refer to EP to evaluate for ablation versus antiarrhythmic  Cardiac amyloidosis: severe concentric hypertrophy and grade 3 diastolic  dysfunction noted on echo. Unable to get MRI due to claustrophobia.  PYP scan on 03/10/2020 strongly suggestive of amyloidosis.  SPEP/UPEP unremarkable but abnormal light chains (kappa/lambda ratio 1.8).  Referred to hematology, work-up unremarkable, not suggestive of AL amyloidosis.  Findings consistent with ATTR amyloidosis.  Genetic testing was negative. -Continue tafamidis 61 mg daily   Chronic combined systolic and diastolic heart failure: Echo 01/23/19 with EF 40-45%, G3DD, diffuse LV hypokinesis without RWMA, mild concentric LV hypertrophy, mildly reduced RV systolic dysfunction, and mild-moderate MR.  Repeat Echo 05/01/19 showed normalization of LV systolic function, but severe concentric LVH.  Coronary CTA with nonobstructive CAD.    PYP scan consistent with amyloidosis as above.  Echocardiogram on 05/12/2021 showed EF 45 to 50%, severe LVH, normal RV function, severe left atrial dilatation, moderate right atrial dilatation, no significant valvular disease.  Echocardiogram 05/25/2022 showed EF 55 to 60%, severe LVH, normal RV function, severe biatrial enlargement, mild mitral regurgitation.  Echocardiogram 06/02/2023 showed EF 55 to 60%, mild RV dysfunction, severe biatrial enlargement.   - Appears euvolemic.  Continue Lasix 40 mg daily as needed if gains more than 3 pounds in 1 day or 5 pounds in 1 week - Continue Farxiga 10 mg daily - Continue spironolactone 12.5 mg - Continue Entresto 49-51 mg.   - Continue carvedilol 6.25 mg twice daily - Continue tafamidis for cardiac amyloidosis as above  Nonobstructive CAD: CT 01/2019 showed calcium score 0, but noncalcified plaque causing ~50% RCA stenosis (CTFFR 0.88, suggesting lesion is not functionally significant).  Denies any anginal symptoms - Atorvastatin 40 mg daily - Stopped aspirin given started Eliquis as above   Hypertension: Appears controlled -Continue Entresto and carvedilol   HLD: LDL 52 on 04/22/2021, on atorvastatin 40 mg daily.  LDL  04/26/2022 was 109, patient reported he had not been taking atorvastatin consistently.  Restarted atorvastatin 40 mg daily, LDL 46 on 11/07/2022   Thoracic aortic dilatation noted to have a 4.2cm dilation of the ascending aorta.  Repeat CTA chest on 03/09/2020 showed 3.9 cm ascending aorta.  Measured 3.7 cm on echocardiogram 05/2022  Pulmonary embolism/left lower extremity DVT: Diagnosed during hospitalization on 01/21/2019.  Started on Eliquis 5 mg twice daily.  PE thought to be provoked, due to sedentary lifestyle (working long hours as Airline pilot, sitting at desk all day).  Completed 34-month course of anticoagulation and was discontinued -Now back on Eliquis given atrial fibrillation/flutter as above  Pulmonary nodule: 1 cm rounded nodular opacity involving the right lower lobe noted on CT chest 01/21/2019, which could represent  atelectasis, infiltrate, or pulmonary nodule.  Follow-up CT on 05/14/2019 showed resolution of nodule  RTC in 6 months   Medication Adjustments/Labs and Tests Ordered: Current medicines are reviewed at length with the patient today.  Concerns regarding medicines are outlined above.  Orders Placed This Encounter  Procedures   Basic Metabolic Panel (BMET)   Magnesium   Ambulatory referral to Cardiac Electrophysiology   EKG 12-Lead     No orders of the defined types were placed in this encounter.    Patient Instructions  Medication Instructions:  Your physician recommends that you continue on your current medications as directed. Please refer to the Current Medication list given to you today.  *If you need a refill on your cardiac medications before your next appointment, please call your pharmacy*   Lab Work: BMET, Mag today If you have labs (blood work) drawn today and your tests are completely normal, you will receive your results only by: MyChart Message (if you have MyChart) OR A paper copy in the mail If you have any lab test that is abnormal or we  need to change your treatment, we will call you to review the results.  Follow-Up: At [facility], you and your health needs are our priority.  As part of our continuing mission to provide you with exceptional heart care, we have created designated Provider Care Teams.  These Care Teams include your primary Cardiologist (physician) and Advanced Practice Providers (APPs -  Physician Assistants and Nurse Practitioners) who all work together to provide you with the care you need, when you need it.   Your next appointment:   6 month(s)  Provider:   Little Ishikawa, MD     Other Instructions           Signed, Little Ishikawa, MD  08/09/2023 12:32 PM    [facility]

## 2023-08-07 ENCOUNTER — Encounter: Payer: Self-pay | Admitting: Cardiology

## 2023-08-07 ENCOUNTER — Ambulatory Visit: Payer: PPO | Attending: Cardiology | Admitting: Cardiology

## 2023-08-07 VITALS — BP 112/78 | HR 59 | Ht 72.0 in | Wt 232.2 lb

## 2023-08-07 DIAGNOSIS — I43 Cardiomyopathy in diseases classified elsewhere: Secondary | ICD-10-CM | POA: Diagnosis not present

## 2023-08-07 DIAGNOSIS — I10 Essential (primary) hypertension: Secondary | ICD-10-CM

## 2023-08-07 DIAGNOSIS — I5042 Chronic combined systolic (congestive) and diastolic (congestive) heart failure: Secondary | ICD-10-CM

## 2023-08-07 DIAGNOSIS — E854 Organ-limited amyloidosis: Secondary | ICD-10-CM

## 2023-08-07 DIAGNOSIS — I2510 Atherosclerotic heart disease of native coronary artery without angina pectoris: Secondary | ICD-10-CM | POA: Diagnosis not present

## 2023-08-07 DIAGNOSIS — I4891 Unspecified atrial fibrillation: Secondary | ICD-10-CM | POA: Diagnosis not present

## 2023-08-07 DIAGNOSIS — Z79899 Other long term (current) drug therapy: Secondary | ICD-10-CM

## 2023-08-07 NOTE — Patient Instructions (Addendum)
 Medication Instructions:  Your physician recommends that you continue on your current medications as directed. Please refer to the Current Medication list given to you today.  *If you need a refill on your cardiac medications before your next appointment, please call your pharmacy*   Lab Work: BMET, Mag today If you have labs (blood work) drawn today and your tests are completely normal, you will receive your results only by: MyChart Message (if you have MyChart) OR A paper copy in the mail If you have any lab test that is abnormal or we need to change your treatment, we will call you to review the results.  Follow-Up: At [facility], you and your health needs are our priority.  As part of our continuing mission to provide you with exceptional heart care, we have created designated Provider Care Teams.  These Care Teams include your primary Cardiologist (physician) and Advanced Practice Providers (APPs -  Physician Assistants and Nurse Practitioners) who all work together to provide you with the care you need, when you need it.   Your next appointment:   6 month(s)  Provider:   Little Ishikawa, MD     Other Instructions

## 2023-08-08 LAB — BASIC METABOLIC PANEL
BUN/Creatinine Ratio: 17 (ref 10–24)
BUN: 19 mg/dL (ref 8–27)
CO2: 25 mmol/L (ref 20–29)
Calcium: 10 mg/dL (ref 8.6–10.2)
Chloride: 97 mmol/L (ref 96–106)
Creatinine, Ser: 1.15 mg/dL (ref 0.76–1.27)
Glucose: 81 mg/dL (ref 70–99)
Potassium: 5.2 mmol/L (ref 3.5–5.2)
Sodium: 138 mmol/L (ref 134–144)
eGFR: 70 mL/min/{1.73_m2} (ref >59)

## 2023-08-08 LAB — MAGNESIUM: Magnesium: 2.3 mg/dL (ref 1.6–2.3)

## 2023-08-09 ENCOUNTER — Encounter: Payer: Self-pay | Admitting: *Deleted

## 2023-08-17 ENCOUNTER — Other Ambulatory Visit: Payer: Self-pay

## 2023-08-22 ENCOUNTER — Other Ambulatory Visit (HOSPITAL_COMMUNITY): Payer: Self-pay

## 2023-08-24 ENCOUNTER — Other Ambulatory Visit: Payer: Self-pay

## 2023-08-24 NOTE — Progress Notes (Signed)
 Specialty Pharmacy Refill Coordination Note  Andrew Vaughn is a 69 y.o. male contacted today regarding refills of specialty medication(s) Tafamidis [name])   Patient requested Delivery   Delivery date: 08/29/23   Verified address: [address] [name] [phone]   Medication will be filled on 08/28/23.

## 2023-08-28 ENCOUNTER — Other Ambulatory Visit: Payer: Self-pay

## 2023-09-12 ENCOUNTER — Ambulatory Visit: Attending: Cardiology | Admitting: Cardiology

## 2023-09-12 ENCOUNTER — Encounter: Payer: Self-pay | Admitting: Cardiology

## 2023-09-12 VITALS — BP 116/62 | HR 61 | Ht 72.0 in | Wt 234.8 lb

## 2023-09-12 DIAGNOSIS — I4819 Other persistent atrial fibrillation: Secondary | ICD-10-CM

## 2023-09-12 DIAGNOSIS — I2510 Atherosclerotic heart disease of native coronary artery without angina pectoris: Secondary | ICD-10-CM

## 2023-09-12 DIAGNOSIS — D6869 Other thrombophilia: Secondary | ICD-10-CM

## 2023-09-12 DIAGNOSIS — I5022 Chronic systolic (congestive) heart failure: Secondary | ICD-10-CM

## 2023-09-12 NOTE — Progress Notes (Signed)
 Electrophysiology Office Note:   Date:  09/12/2023  ID:  Andrew Vaughn, DOB 1955-07-09, MRN [id number]  Primary Cardiologist: Wendie Hamburg, MD Primary Heart Failure: None Electrophysiologist: None      History of Present Illness:   Andrew Vaughn is a 68 y.o. male with h/o nonobstructive coronary artery disease, hypertension, hyperlipidemia, thoracic aortic dilation, PE/DVT, cardiac amyloidosis, chronic systolic heart failure, atrial fibrillation/flutter seen today for  for Electrophysiology evaluation of atrial fibrillation at the request of Alda Amas.  He presented to the atrial fibrillation clinic in atrial flutter.  He is now post cardioversion 05/31/2023.   He was found to have heart failure in 2021 with an ejection fraction of 40 to 45%.  He was started on Entresto  and carvedilol .  CT showed nonobstructive coronary artery disease.  Repeat echo in 2020 showed normalization of LV function with severe LVH and grade 3 diastolic dysfunction.  PYP scan was consistent for amyloidosis.  He had again a drop in his ejection fraction in 2022 to 40 to 45%.  Ejection fraction normalized in 2024.  He was found to be in atrial fibrillation and underwent cardioversion 12/06/2022.  He then had atrial flutter and had cardioversion as above.  Today, denies symptoms of palpitations, chest pain, shortness of breath, orthopnea, PND, lower extremity edema, claudication, dizziness, presyncope, syncope, bleeding, or neurologic sequela. The patient is tolerating medications without difficulties.  When he was in atrial fibrillation, he has significant fatigue and shortness of breath.  Now that he is in normal rhythm, he feels well without complaint.  Review of systems complete and found to be negative unless listed in HPI.   EP Information / Studies Reviewed:    EKG is not ordered today. EKG from 05/11/2023 reviewed which showed atrial fibrillation        Risk Assessment/Calculations:     CHA2DS2-VASc Score = 4   This indicates a 4.8% annual risk of stroke. The patient's score is based upon: CHF History: 1 HTN History: 1 Diabetes History: 0 Stroke History: 0 Vascular Disease History: 1 Age Score: 1 Gender Score: 0            Physical Exam:   VS:  There were no vitals taken for this visit.   Wt Readings from Last 3 Encounters:  08/07/23 232 lb 3.2 oz (105.3 kg)  05/31/23 226 lb (102.5 kg)  05/11/23 230 lb (104.3 kg)     GEN: Well nourished, well developed in no acute distress NECK: No JVD; No carotid bruits CARDIAC: Regular rate and rhythm, no murmurs, rubs, gallops RESPIRATORY:  Clear to auscultation without rales, wheezing or rhonchi  ABDOMEN: Soft, non-tender, non-distended EXTREMITIES:  No edema; No deformity   ASSESSMENT AND PLAN:    1.  Persistent atrial fibrillation/flutter:: On carvedilol .  He has had cardioversion x 2.  He would benefit from rhythm control.  We discussed medication options versus ablation.  For now, he would like to discuss this further with his family.  He may wish to call back and have his procedure scheduled.  If in 6 months he decides on ablation, we Andrew Vaughn schedule at that time as well.  2.  Cardiac amyloidosis: Currently on tafamidis  per primary cardiology.  3.  Chronic combined systolic and diastolic heart failure: Ejection fraction as low as 40%.  Recent echo with a normal ejection fraction.  Continue medical therapy per primary cardiology.  4.  Nonobstructive coronary artery disease: 50% RCA stenosis with a noncalcified plaque.  Continue management per primary  cardiology.  5.  Secondary hypercoagulable state: Currently on Eliquis  for atrial fibrillation  Follow up with EP APP in 6 months  Signed, Andrew Lazenby Cortland Ding, MD

## 2023-09-12 NOTE — Patient Instructions (Signed)
 Medication Instructions:  Your physician recommends that you continue on your current medications as directed. Please refer to the Current Medication list given to you today.  *If you need a refill on your cardiac medications before your next appointment, please call your pharmacy*   Lab Work: None ordered If you have labs (blood work) drawn today and your tests are completely normal, you will receive your results only by: MyChart Message (if you have MyChart) OR A paper copy in the mail If you have any lab test that is abnormal or we need to change your treatment, we will call you to review the results.   Testing/Procedures: None ordered   Follow-Up: At [facility], you and your health needs are our priority.  As part of our continuing mission to provide you with exceptional heart care, we have created designated Provider Care Teams.  These Care Teams include your primary Cardiologist (physician) and Advanced Practice Providers (APPs -  Physician Assistants and Nurse Practitioners) who all work together to provide you with the care you need, when you need it.  We recommend signing up for the patient portal called "MyChart".  Sign up information is provided on this After Visit Summary.  MyChart is used to connect with patients for Virtual Visits (Telemedicine).  Patients are able to view lab/test results, encounter notes, upcoming appointments, etc.  Non-urgent messages can be sent to your provider as well.   To learn more about what you can do with MyChart, go to [email or web address].    Your next appointment:   6 month(s)  The format for your next appointment:   In Person  Provider:   You will see one of the following Advanced Practice Providers on your designated Care Team:   Mertha Abrahams, Kennard Pea [address] [place], PA-C Suzann Riddle, NP Creighton Doffing, NP    Thank you for choosing [facility]!!   Reece Cane, RN [phone]  Other Instructions   Cardiac  Ablation Cardiac ablation is a procedure to destroy (ablate) heart tissue that is sending bad signals. These bad signals cause the heart to beat very fast or in a way that is not normal. Destroying some tissues can help make the heart rhythm normal. Tell your doctor about: Any allergies you have. All medicines you are taking. These include vitamins, herbs, eye drops, creams, and over-the-counter medicines. Any problems you or family members have had with anesthesia. Any bleeding problems you have. Any surgeries you have had. Any medical conditions you have. Whether you are pregnant or may be pregnant. What are the risks? Your doctor will talk with you about risks. These may include: Infection. Bruising and bleeding. Stroke or blood clots. Damage to nearby areas of your body. Allergies to medicines or dyes. Needing a pacemaker if the heart gets damaged. A pacemaker helps the heart beat normally. The procedure not working. What happens before the procedure? Medicines Ask your doctor about changing or stopping: Your normal medicines. Vitamins, herbs, and supplements. Over-the-counter medicines. Do not take aspirin  or ibuprofen unless you are told to. General instructions Follow instructions from your doctor about what you may eat and drink. If you will be going home right after the procedure, plan to have a responsible adult: Take you home from the hospital or clinic. You will not be allowed to drive. Care for you for the time you are told. Ask your doctor what steps will be taken to prevent the spread of germs. What happens during the procedure?  An IV tube  will be put into one of your veins. You may be given: A sedative. This helps you relax. Anesthesia. This will: Numb certain areas of your body. The skin on your neck or groin will be numbed. A cut (incision) will be made in your neck or groin. A needle will be put through the cut and into a large vein. The small, thin tube  (catheter) will be put into the needle. The tube will be moved to your heart. A type of X-ray (fluoroscopy) will be used to help guide the tube. It will also show constant images of the heart on a screen. Dye may be put through the tube. This helps your doctor see your heart. An electric current will be sent from the tube to destroy heart tissue in certain areas. The tube will be taken out. Pressure will be held on your cut. This helps stop bleeding. A bandage (dressing) will be put over your cut. The procedure may vary among doctors and hospitals. What happens after the procedure? You will be monitored until you leave the hospital or clinic. This includes checking your blood pressure, heart rate and rhythm, breathing rate, and blood oxygen level. Your cut will be checked for bleeding. You will need to lie still for a few hours. If your groin was used, you will need to keep your leg straight for a few hours after the small, thin tube is removed. This information is not intended to replace advice given to you by your health care provider. Make sure you discuss any questions you have with your health care provider. Document Revised: 10/26/2021 Document Reviewed: 10/26/2021 Elsevier Patient Education  2024 [employer].

## 2023-09-15 DIAGNOSIS — H2511 Age-related nuclear cataract, right eye: Secondary | ICD-10-CM | POA: Diagnosis not present

## 2023-09-15 DIAGNOSIS — H5213 Myopia, bilateral: Secondary | ICD-10-CM | POA: Diagnosis not present

## 2023-09-15 DIAGNOSIS — H401131 Primary open-angle glaucoma, bilateral, mild stage: Secondary | ICD-10-CM | POA: Diagnosis not present

## 2023-09-22 ENCOUNTER — Other Ambulatory Visit: Payer: Self-pay

## 2023-09-25 ENCOUNTER — Other Ambulatory Visit: Payer: Self-pay

## 2023-10-02 ENCOUNTER — Other Ambulatory Visit (HOSPITAL_COMMUNITY): Payer: Self-pay

## 2023-10-02 NOTE — Progress Notes (Signed)
 Specialty Pharmacy Refill Coordination Note  Andrew Vaughn is a 68 y.o. male contacted today regarding refills of specialty medication(s) Tafamidis  (Vyndamax )   Patient requested Delivery   Delivery date: 10/04/23   Verified address: [address] [place] [address]   Medication will be filled on 10/03/23.

## 2023-10-03 ENCOUNTER — Other Ambulatory Visit: Payer: Self-pay

## 2023-10-03 ENCOUNTER — Other Ambulatory Visit (HOSPITAL_COMMUNITY): Payer: Self-pay

## 2023-10-03 NOTE — Progress Notes (Signed)
 Sent to [employer] [facility] and notified pt.

## 2023-10-04 ENCOUNTER — Other Ambulatory Visit (HOSPITAL_COMMUNITY): Payer: Self-pay

## 2023-10-04 ENCOUNTER — Telehealth: Payer: Self-pay | Admitting: Pharmacist

## 2023-10-04 ENCOUNTER — Telehealth: Payer: Self-pay

## 2023-10-04 ENCOUNTER — Other Ambulatory Visit: Payer: Self-pay

## 2023-10-04 NOTE — Telephone Encounter (Signed)
 Pharmacy Patient Advocate Encounter  Received notification from [employer] that Prior Authorization for VYNDAMAX  has been APPROVED from 10/04/23 to 10/03/24. Ran test claim, Copay is $0. This test claim was processed through [facility]- copay amounts may vary at other pharmacies due to pharmacy/plan contracts, or as the patient moves through the different stages of their insurance plan.

## 2023-10-04 NOTE — Telephone Encounter (Addendum)
 Pharmacy Patient Advocate Encounter   Received notification from Physician's Office that prior authorization for VYNDAMAX  is required/requested.   Insurance verification completed.   The patient is insured through [facility] ADVANTAGE/RX ADVANCE .   Per test claim: PA required; PA started via CoverMyMeds. KEY [id number]. Please see clinical question(s) below that I am not finding the answer to in his chart and advise. What is the current NYHA functional class of the patient's heart failure? Please advise PER V.PATEL RPH, CLASS 1

## 2023-10-04 NOTE — Telephone Encounter (Signed)
 Yes, needing additional info for PA approval, please see separate encounter

## 2023-10-04 NOTE — Telephone Encounter (Signed)
-----   Message from Abigail Hoff sent at 10/03/2023  3:52 PM EDT ----- Regarding: PA Hi,   This patient's Vnydamax prescription requires a prior authorization.  Thanks!

## 2023-10-04 NOTE — Telephone Encounter (Signed)
    Scan in media as well

## 2023-10-06 ENCOUNTER — Other Ambulatory Visit: Payer: Self-pay

## 2023-10-07 ENCOUNTER — Other Ambulatory Visit: Payer: Self-pay | Admitting: Cardiology

## 2023-10-31 ENCOUNTER — Other Ambulatory Visit: Payer: Self-pay

## 2023-10-31 ENCOUNTER — Other Ambulatory Visit: Payer: Self-pay | Admitting: Pharmacy Technician

## 2023-10-31 NOTE — Progress Notes (Signed)
 Specialty Pharmacy Refill Coordination Note  Andrew Vaughn is a 68 y.o. male contacted today regarding refills of specialty medication(s) Tafamidis  (Vyndamax )   Patient requested Delivery   Delivery date: 11/03/23   Verified address: [address] [place]   Medication will be filled on 11/02/23.

## 2023-11-01 ENCOUNTER — Other Ambulatory Visit: Payer: Self-pay

## 2023-11-01 ENCOUNTER — Encounter: Payer: Self-pay | Admitting: Family Medicine

## 2023-11-01 ENCOUNTER — Ambulatory Visit (INDEPENDENT_AMBULATORY_CARE_PROVIDER_SITE_OTHER): Admitting: Family Medicine

## 2023-11-01 VITALS — BP 118/68 | HR 56 | Temp 98.4°F | Ht 70.25 in | Wt 231.0 lb

## 2023-11-01 DIAGNOSIS — R972 Elevated prostate specific antigen [PSA]: Secondary | ICD-10-CM

## 2023-11-01 DIAGNOSIS — Z0000 Encounter for general adult medical examination without abnormal findings: Secondary | ICD-10-CM

## 2023-11-01 NOTE — Progress Notes (Signed)
 Subjective:    Patient ID: Andrew Vaughn, male    DOB: 06-05-1955, 68 y.o.   MRN: [id number]  HPI Here for a well exam. He has no complaints today. He sees Dr. Alda Amas for cardiology care, and he recently saw Dr. Lawana Pray for an EP consultation. He has failure two cardioversions, and they discussed a possible ablation procedure. However he is mostly in sinus rhythm now and he feels fine, so they agreed to simply observe him for now. He denies any chest pain or SOB.    Review of Systems  Constitutional: Negative.   HENT: Negative.    Eyes: Negative.   Respiratory: Negative.    Cardiovascular: Negative.   Gastrointestinal: Negative.   Genitourinary: Negative.   Musculoskeletal: Negative.   Skin: Negative.   Neurological: Negative.   Psychiatric/Behavioral: Negative.         Objective:   Physical Exam Constitutional:      General: He is not in acute distress.    Appearance: Normal appearance. He is well-developed. He is not diaphoretic.  HENT:     Head: Normocephalic and atraumatic.     Right Ear: External ear normal.     Left Ear: External ear normal.     Nose: Nose normal.     Mouth/Throat:     Pharynx: No oropharyngeal exudate.  Eyes:     General: No scleral icterus.       Right eye: No discharge.        Left eye: No discharge.     Conjunctiva/sclera: Conjunctivae normal.     Pupils: Pupils are equal, round, and reactive to light.  Neck:     Thyroid : No thyromegaly.     Vascular: No JVD.     Trachea: No tracheal deviation.  Cardiovascular:     Rate and Rhythm: Normal rate and regular rhythm.     Pulses: Normal pulses.     Heart sounds: Normal heart sounds. No murmur heard.    No friction rub. No gallop.  Pulmonary:     Effort: Pulmonary effort is normal. No respiratory distress.     Breath sounds: Normal breath sounds. No wheezing or rales.  Chest:     Chest wall: No tenderness.  Abdominal:     General: Bowel sounds are normal. There is no distension.      Palpations: Abdomen is soft. There is no mass.     Tenderness: There is no abdominal tenderness. There is no guarding or rebound.  Genitourinary:    Penis: Normal. No tenderness.      Testes: Normal.     Prostate: Normal.     Rectum: Normal. Guaiac result negative.  Musculoskeletal:        General: No tenderness. Normal range of motion.     Cervical back: Neck supple.  Lymphadenopathy:     Cervical: No cervical adenopathy.  Skin:    General: Skin is warm and dry.     Coloration: Skin is not pale.     Findings: No erythema or rash.  Neurological:     General: No focal deficit present.     Mental Status: He is alert and oriented to person, place, and time.     Cranial Nerves: No cranial nerve deficit.     Motor: No abnormal muscle tone.     Coordination: Coordination normal.     Deep Tendon Reflexes: Reflexes are normal and symmetric. Reflexes normal.  Psychiatric:        Mood and Affect: Mood  normal.        Behavior: Behavior normal.        Thought Content: Thought content normal.        Judgment: Judgment normal.           Assessment & Plan:  Well exam. We discussed diet and exercise. Get fasting labs. Set up his first colonoscopy.  Corita Diego, MD

## 2023-11-02 ENCOUNTER — Ambulatory Visit: Payer: Self-pay | Admitting: Family Medicine

## 2023-11-02 LAB — BASIC METABOLIC PANEL WITH GFR
BUN: 21 mg/dL (ref 7–25)
CO2: 26 mmol/L (ref 20–32)
Calcium: 9.9 mg/dL (ref 8.6–10.3)
Chloride: 102 mmol/L (ref 98–110)
Creat: 1.1 mg/dL (ref 0.70–1.35)
Glucose, Bld: 96 mg/dL (ref 65–99)
Potassium: 4.2 mmol/L (ref 3.5–5.3)
Sodium: 138 mmol/L (ref 135–146)
eGFR: 74 mL/min/{1.73_m2} (ref >=60)

## 2023-11-02 LAB — CBC WITH DIFFERENTIAL/PLATELET
Absolute Lymphocytes: 1660 {cells}/uL (ref 850–3900)
Absolute Monocytes: 490 {cells}/uL (ref 200–950)
Basophils Absolute: 33 {cells}/uL (ref 0–200)
Basophils Relative: 0.4 %
Eosinophils Absolute: 133 {cells}/uL (ref 15–500)
Eosinophils Relative: 1.6 %
HCT: 45.7 % (ref 38.5–50.0)
Hemoglobin: 15 g/dL (ref 13.2–17.1)
MCH: 29.7 pg (ref 27.0–33.0)
MCHC: 32.8 g/dL (ref 32.0–36.0)
MCV: 90.5 fL (ref 80.0–100.0)
MPV: 12.6 fL — ABNORMAL HIGH (ref 7.5–12.5)
Monocytes Relative: 5.9 %
Neutro Abs: 5984 {cells}/uL (ref 1500–7800)
Neutrophils Relative %: 72.1 %
Platelets: 178 10*3/uL (ref 140–400)
RBC: 5.05 10*6/uL (ref 4.20–5.80)
RDW: 12.7 % (ref 11.0–15.0)
Total Lymphocyte: 20 %
WBC: 8.3 10*3/uL (ref 3.8–10.8)

## 2023-11-02 LAB — LIPID PANEL
Cholesterol: 146 mg/dL (ref <200)
HDL: 39 mg/dL — ABNORMAL LOW (ref >=40)
LDL Cholesterol (Calc): 88 mg/dL
Non-HDL Cholesterol (Calc): 107 mg/dL (ref <130)
Total CHOL/HDL Ratio: 3.7 (calc) (ref <5.0)
Triglycerides: 96 mg/dL (ref <150)

## 2023-11-02 LAB — HEMOGLOBIN A1C
Hgb A1c MFr Bld: 5.8 % — ABNORMAL HIGH (ref <5.7)
Mean Plasma Glucose: 120 mg/dL
eAG (mmol/L): 6.6 mmol/L

## 2023-11-02 LAB — HEPATIC FUNCTION PANEL
AG Ratio: 1.5 (calc) (ref 1.0–2.5)
ALT: 14 U/L (ref 9–46)
AST: 20 U/L (ref 10–35)
Albumin: 4.5 g/dL (ref 3.6–5.1)
Alkaline phosphatase (APISO): 107 U/L (ref 35–144)
Bilirubin, Direct: 0.2 mg/dL (ref 0.0–0.2)
Globulin: 3 g/dL (ref 1.9–3.7)
Indirect Bilirubin: 0.8 mg/dL (ref 0.2–1.2)
Total Bilirubin: 1 mg/dL (ref 0.2–1.2)
Total Protein: 7.5 g/dL (ref 6.1–8.1)

## 2023-11-02 LAB — TSH: TSH: 1.65 m[IU]/L (ref 0.40–4.50)

## 2023-11-02 LAB — PSA: PSA: 4.95 ng/mL — ABNORMAL HIGH (ref <=4.00)

## 2023-11-27 ENCOUNTER — Other Ambulatory Visit: Payer: Self-pay

## 2023-11-27 ENCOUNTER — Other Ambulatory Visit: Payer: Self-pay | Admitting: Cardiology

## 2023-11-29 ENCOUNTER — Other Ambulatory Visit: Payer: Self-pay

## 2023-11-29 ENCOUNTER — Other Ambulatory Visit (HOSPITAL_COMMUNITY): Payer: Self-pay

## 2023-11-29 MED ORDER — VYNDAMAX 61 MG PO CAPS
61.0000 mg | ORAL_CAPSULE | Freq: Every day | ORAL | 2 refills | Status: AC
  Filled 2023-11-29: qty 30, 30d supply, fill #0
  Filled 2023-12-29 – 2024-01-08 (×2): qty 30, 30d supply, fill #1
  Filled 2024-02-05: qty 30, 30d supply, fill #2
  Filled 2024-03-05 – 2024-03-12 (×2): qty 30, 30d supply, fill #3
  Filled 2024-04-04: qty 30, 30d supply, fill #4
  Filled 2024-05-03 – 2024-05-22 (×2): qty 30, 30d supply, fill #5
  Filled 2024-06-12 – 2024-06-26 (×3): qty 30, 30d supply, fill #6

## 2023-12-01 ENCOUNTER — Other Ambulatory Visit: Payer: Self-pay

## 2023-12-01 NOTE — Progress Notes (Signed)
 Specialty Pharmacy Refill Coordination Note  Andrew Vaughn is a 68 y.o. male contacted today regarding refills of specialty medication(s) Tafamidis  (Vyndamax )   Patient requested Delivery   Delivery date: 12/05/23   Verified address: [address] [place]   Medication will be filled on 12/04/23.

## 2023-12-03 ENCOUNTER — Other Ambulatory Visit: Payer: Self-pay | Admitting: Cardiology

## 2023-12-03 DIAGNOSIS — R972 Elevated prostate specific antigen [PSA]: Secondary | ICD-10-CM

## 2023-12-04 NOTE — Telephone Encounter (Signed)
 Prescription refill request for Eliquis  received. Indication:afib Last office visit:4/25 Scr:1.10  6/25 Age: 68 Weight:104.8  kg  Prescription refilled

## 2023-12-06 ENCOUNTER — Other Ambulatory Visit (INDEPENDENT_AMBULATORY_CARE_PROVIDER_SITE_OTHER)

## 2023-12-06 DIAGNOSIS — R972 Elevated prostate specific antigen [PSA]: Secondary | ICD-10-CM | POA: Diagnosis not present

## 2023-12-06 LAB — PSA: PSA: 6.88 ng/mL — ABNORMAL HIGH (ref 0.10–4.00)

## 2023-12-06 NOTE — Telephone Encounter (Signed)
 Done

## 2023-12-07 ENCOUNTER — Ambulatory Visit: Payer: Self-pay | Admitting: Family Medicine

## 2023-12-07 NOTE — Addendum Note (Signed)
 Addended by: JOHNNY SENIOR A on: 12/07/2023 07:59 AM   Modules accepted: Orders

## 2023-12-29 ENCOUNTER — Other Ambulatory Visit: Payer: Self-pay

## 2023-12-30 ENCOUNTER — Other Ambulatory Visit (HOSPITAL_COMMUNITY): Payer: Self-pay

## 2024-01-02 ENCOUNTER — Other Ambulatory Visit: Payer: Self-pay

## 2024-01-02 ENCOUNTER — Other Ambulatory Visit: Payer: Self-pay | Admitting: Cardiology

## 2024-01-08 ENCOUNTER — Other Ambulatory Visit: Payer: Self-pay

## 2024-01-08 NOTE — Progress Notes (Signed)
 Specialty Pharmacy Refill Coordination Note  Andrew Vaughn is a 68 y.o. male contacted today regarding refills of specialty medication(s) Tafamidis  (Vyndamax )   Patient requested Delivery   Delivery date: 01/10/24   Verified address: [address] [place]   Medication will be filled on 01/09/24.

## 2024-01-16 ENCOUNTER — Encounter: Payer: Self-pay | Admitting: Family Medicine

## 2024-01-23 ENCOUNTER — Other Ambulatory Visit: Payer: Self-pay | Admitting: Cardiology

## 2024-01-25 ENCOUNTER — Encounter: Payer: Self-pay | Admitting: Cardiology

## 2024-01-26 ENCOUNTER — Other Ambulatory Visit (HOSPITAL_COMMUNITY): Payer: Self-pay

## 2024-02-01 ENCOUNTER — Other Ambulatory Visit: Payer: Self-pay | Admitting: Urology

## 2024-02-01 DIAGNOSIS — N400 Enlarged prostate without lower urinary tract symptoms: Secondary | ICD-10-CM | POA: Diagnosis not present

## 2024-02-01 DIAGNOSIS — R972 Elevated prostate specific antigen [PSA]: Secondary | ICD-10-CM

## 2024-02-05 ENCOUNTER — Other Ambulatory Visit: Payer: Self-pay

## 2024-02-05 ENCOUNTER — Other Ambulatory Visit: Payer: Self-pay | Admitting: Pharmacy Technician

## 2024-02-05 NOTE — Progress Notes (Signed)
 Specialty Pharmacy Refill Coordination Note  Andrew Vaughn is a 68 y.o. male contacted today regarding refills of specialty medication(s) Tafamidis  (Vyndamax )   Patient requested Delivery   Delivery date: 02/08/24   Verified address: [address]  [place]   Medication will be filled on 02/07/24.

## 2024-02-06 ENCOUNTER — Other Ambulatory Visit: Payer: Self-pay

## 2024-02-20 DIAGNOSIS — L821 Other seborrheic keratosis: Secondary | ICD-10-CM | POA: Diagnosis not present

## 2024-02-20 DIAGNOSIS — L570 Actinic keratosis: Secondary | ICD-10-CM | POA: Diagnosis not present

## 2024-02-20 DIAGNOSIS — Z85828 Personal history of other malignant neoplasm of skin: Secondary | ICD-10-CM | POA: Diagnosis not present

## 2024-02-20 DIAGNOSIS — L812 Freckles: Secondary | ICD-10-CM | POA: Diagnosis not present

## 2024-02-20 DIAGNOSIS — D1801 Hemangioma of skin and subcutaneous tissue: Secondary | ICD-10-CM | POA: Diagnosis not present

## 2024-02-27 ENCOUNTER — Other Ambulatory Visit: Payer: Self-pay | Admitting: Cardiology

## 2024-02-27 DIAGNOSIS — M1811 Unilateral primary osteoarthritis of first carpometacarpal joint, right hand: Secondary | ICD-10-CM | POA: Diagnosis not present

## 2024-02-27 DIAGNOSIS — M65321 Trigger finger, right index finger: Secondary | ICD-10-CM | POA: Diagnosis not present

## 2024-02-29 ENCOUNTER — Other Ambulatory Visit: Payer: Self-pay

## 2024-03-05 ENCOUNTER — Other Ambulatory Visit: Payer: Self-pay

## 2024-03-07 ENCOUNTER — Other Ambulatory Visit: Payer: Self-pay

## 2024-03-12 ENCOUNTER — Other Ambulatory Visit: Payer: Self-pay

## 2024-03-12 NOTE — Progress Notes (Signed)
 Specialty Pharmacy Refill Coordination Note  Andrew Vaughn is a 68 y.o. male contacted today regarding refills of specialty medication(s) Tafamidis  (Vyndamax )   Patient requested Delivery   Delivery date: 03/13/24   Verified address: [address]  [name]   Medication will be filled on 03/12/24.

## 2024-03-13 ENCOUNTER — Other Ambulatory Visit: Payer: Self-pay

## 2024-03-15 DIAGNOSIS — H401122 Primary open-angle glaucoma, left eye, moderate stage: Secondary | ICD-10-CM | POA: Diagnosis not present

## 2024-03-15 DIAGNOSIS — H401111 Primary open-angle glaucoma, right eye, mild stage: Secondary | ICD-10-CM | POA: Diagnosis not present

## 2024-03-20 DIAGNOSIS — H401122 Primary open-angle glaucoma, left eye, moderate stage: Secondary | ICD-10-CM | POA: Diagnosis not present

## 2024-04-04 ENCOUNTER — Other Ambulatory Visit: Payer: Self-pay

## 2024-04-08 ENCOUNTER — Other Ambulatory Visit (HOSPITAL_COMMUNITY): Payer: Self-pay

## 2024-04-10 ENCOUNTER — Other Ambulatory Visit: Payer: Self-pay

## 2024-04-10 ENCOUNTER — Other Ambulatory Visit (HOSPITAL_COMMUNITY): Payer: Self-pay

## 2024-04-10 NOTE — Progress Notes (Signed)
 Specialty Pharmacy Refill Coordination Note  Spoke with Andrew Vaughn is a 68 y.o. male contacted today regarding refills of specialty medication(s) Tafamidis  (Vyndamax )  Doses on hand: 10  Patient requested: Delivery   Delivery date: 04/15/24   Verified address: [address] [place] [address]  Medication will be filled on 04/12/24 .

## 2024-04-11 ENCOUNTER — Other Ambulatory Visit: Payer: Self-pay

## 2024-05-03 ENCOUNTER — Other Ambulatory Visit (HOSPITAL_COMMUNITY): Payer: Self-pay

## 2024-05-07 ENCOUNTER — Other Ambulatory Visit: Payer: Self-pay

## 2024-05-09 ENCOUNTER — Other Ambulatory Visit (HOSPITAL_COMMUNITY): Payer: Self-pay

## 2024-05-22 ENCOUNTER — Other Ambulatory Visit: Payer: Self-pay

## 2024-05-22 NOTE — Progress Notes (Signed)
 Specialty Pharmacy Refill Coordination Note  Andrew Vaughn is a 68 y.o. male contacted today regarding refills of specialty medication(s) Tafamidis  (Vyndamax )   Patient requested Delivery   Delivery date: 05/27/24   Verified address: [address] [place] [address]   Medication will be filled on: 05/24/24

## 2024-05-24 ENCOUNTER — Other Ambulatory Visit: Payer: Self-pay

## 2024-05-24 NOTE — Progress Notes (Signed)
 Andrew Vaughn                                          MRN: [id number]   05/24/2024   The VBCI Quality Team Specialist reviewed this patient medical record for the purposes of chart review for care gap closure. The following were reviewed: abstraction for care gap closure-controlling blood pressure.    VBCI Quality Team

## 2024-05-27 ENCOUNTER — Other Ambulatory Visit: Payer: Self-pay | Admitting: Cardiology

## 2024-06-12 ENCOUNTER — Other Ambulatory Visit: Payer: Self-pay

## 2024-06-12 ENCOUNTER — Other Ambulatory Visit: Payer: Self-pay | Admitting: Pharmacist

## 2024-06-12 NOTE — Progress Notes (Signed)
 Specialty Pharmacy Ongoing Clinical Assessment Note  Andrew Vaughn is a 69 y.o. male who is being followed by the specialty pharmacy service for RxSp Cardiology   Patient's specialty medication(s) reviewed today: Tafamidis  (Vyndamax )   Missed doses in the last 4 weeks: 0   Patient/Caregiver did not have any additional questions or concerns.   Therapeutic benefit summary: Patient is achieving benefit   Adverse events/side effects summary: No adverse events/side effects   Patient's therapy is appropriate to: Continue    Goals Addressed             This Visit's Progress    Stabilization of disease   On track    Patient is on track. Patient will maintain adherence.  Patient reports that he remains stable at this time.          Follow up: 12 months  [name] Specialty Pharmacist

## 2024-06-14 ENCOUNTER — Other Ambulatory Visit (HOSPITAL_COMMUNITY): Payer: Self-pay

## 2024-06-17 ENCOUNTER — Other Ambulatory Visit (HOSPITAL_COMMUNITY): Payer: Self-pay

## 2024-06-17 ENCOUNTER — Other Ambulatory Visit: Payer: Self-pay

## 2024-06-17 NOTE — Progress Notes (Signed)
 Benefits Investigation Started  Reason: Grant Filled After Coverage Expired  Routed to: Rachel Shove

## 2024-06-19 ENCOUNTER — Other Ambulatory Visit: Payer: Self-pay

## 2024-06-21 ENCOUNTER — Other Ambulatory Visit: Payer: Self-pay

## 2024-06-24 ENCOUNTER — Other Ambulatory Visit (HOSPITAL_COMMUNITY): Payer: Self-pay

## 2024-06-24 ENCOUNTER — Telehealth: Payer: Self-pay | Admitting: Pharmacy Technician

## 2024-06-24 NOTE — Telephone Encounter (Signed)
 Hi, can you run this on ins and hw grant please and thank you -vyndamax   Patient Advocate Encounter   The patient was approved for a [employer] grant that will help cover the cost of vyndamax  Total amount awarded, 7500.  Effective: 05/25/24 - 05/24/25   APW:[id number] ERW:[id number] Hmnle:[id number] [id number] [employer] ID: [id number]   Pharmacy provided with approval and processing information. Patient informed via mychart

## 2024-06-26 ENCOUNTER — Other Ambulatory Visit (HOSPITAL_COMMUNITY): Payer: Self-pay

## 2024-06-26 NOTE — Progress Notes (Signed)
 Specialty Pharmacy Refill Coordination Note  Andrew Vaughn is a 69 y.o. male contacted today regarding refills of specialty medication(s) Tafamidis  (Vyndamax )   Patient requested Delivery   Delivery date: 06/28/24   Verified address: [address] [place] [address]   Medication will be filled on: 06/27/24

## 2024-06-27 ENCOUNTER — Other Ambulatory Visit: Payer: Self-pay

## 2024-07-15 ENCOUNTER — Ambulatory Visit: Admitting: Student

## 2024-09-13 ENCOUNTER — Ambulatory Visit: Admitting: Cardiology
